# Patient Record
Sex: Female | Born: 1965
Health system: Southern US, Community
[De-identification: ages and names within clinical notes are randomized; demographics above are authoritative.]

## PROBLEM LIST (undated history)

## (undated) DIAGNOSIS — E119 Type 2 diabetes mellitus without complications: Secondary | ICD-10-CM

## (undated) DIAGNOSIS — B019 Varicella without complication: Secondary | ICD-10-CM

## (undated) DIAGNOSIS — I10 Essential (primary) hypertension: Secondary | ICD-10-CM

## (undated) DIAGNOSIS — B009 Herpesviral infection, unspecified: Secondary | ICD-10-CM

## (undated) HISTORY — DX: Type 2 diabetes mellitus without complications: E11.9

## (undated) HISTORY — PX: HERNIA REPAIR: SHX51

## (undated) HISTORY — DX: Essential (primary) hypertension: I10

## (undated) HISTORY — DX: Varicella without complication: B01.9

## (undated) HISTORY — DX: Herpesviral infection, unspecified: B00.9

---

## 1989-04-16 HISTORY — PX: TUBAL LIGATION: SHX77

## 2007-08-07 ENCOUNTER — Ambulatory Visit: Payer: Self-pay | Admitting: Family Medicine

## 2008-02-05 ENCOUNTER — Ambulatory Visit: Payer: Self-pay | Admitting: Family Medicine

## 2009-02-21 LAB — HM DIABETES FOOT EXAM: HM DIABETIC FOOT EXAM: NORMAL

## 2010-10-30 LAB — HM PAP SMEAR: HM Pap smear: NORMAL

## 2012-06-11 ENCOUNTER — Ambulatory Visit: Payer: Self-pay | Admitting: Family Medicine

## 2012-06-12 ENCOUNTER — Ambulatory Visit: Payer: Self-pay | Admitting: Family Medicine

## 2013-01-26 LAB — HM MAMMOGRAPHY: HM Mammogram: ABNORMAL

## 2013-04-16 HISTORY — PX: CHOLECYSTECTOMY: SHX55

## 2013-08-12 ENCOUNTER — Ambulatory Visit: Payer: Self-pay | Admitting: Family Medicine

## 2013-08-28 ENCOUNTER — Ambulatory Visit: Payer: Self-pay | Admitting: Gastroenterology

## 2013-10-26 LAB — HM COLONOSCOPY: HM COLON: NEGATIVE

## 2014-03-01 ENCOUNTER — Ambulatory Visit: Payer: Self-pay | Admitting: Surgery

## 2014-03-01 LAB — COMPREHENSIVE METABOLIC PANEL
ANION GAP: 7 (ref 7–16)
Albumin: 3.3 g/dL — ABNORMAL LOW (ref 3.4–5.0)
Alkaline Phosphatase: 71 U/L
BUN: 12 mg/dL (ref 7–18)
Bilirubin,Total: 0.3 mg/dL (ref 0.2–1.0)
CREATININE: 0.74 mg/dL (ref 0.60–1.30)
Calcium, Total: 8.2 mg/dL — ABNORMAL LOW (ref 8.5–10.1)
Chloride: 103 mmol/L (ref 98–107)
Co2: 28 mmol/L (ref 21–32)
Glucose: 246 mg/dL — ABNORMAL HIGH (ref 65–99)
OSMOLALITY: 284 (ref 275–301)
Potassium: 4 mmol/L (ref 3.5–5.1)
SGOT(AST): 19 U/L (ref 15–37)
SGPT (ALT): 26 U/L
Sodium: 138 mmol/L (ref 136–145)
Total Protein: 7.4 g/dL (ref 6.4–8.2)

## 2014-03-01 LAB — CBC WITH DIFFERENTIAL/PLATELET
BASOS ABS: 0.1 10*3/uL (ref 0.0–0.1)
Basophil %: 1.1 %
EOS ABS: 0.1 10*3/uL (ref 0.0–0.7)
EOS PCT: 0.9 %
HCT: 40 % (ref 35.0–47.0)
HGB: 13 g/dL (ref 12.0–16.0)
Lymphocyte #: 2 10*3/uL (ref 1.0–3.6)
Lymphocyte %: 27 %
MCH: 26.1 pg (ref 26.0–34.0)
MCHC: 32.5 g/dL (ref 32.0–36.0)
MCV: 80 fL (ref 80–100)
Monocyte #: 0.5 x10 3/mm (ref 0.2–0.9)
Monocyte %: 6.9 %
Neutrophil #: 4.9 10*3/uL (ref 1.4–6.5)
Neutrophil %: 64.1 %
Platelet: 275 10*3/uL (ref 150–440)
RBC: 4.97 10*6/uL (ref 3.80–5.20)
RDW: 14.4 % (ref 11.5–14.5)
WBC: 7.6 10*3/uL (ref 3.6–11.0)

## 2014-03-05 ENCOUNTER — Ambulatory Visit: Payer: Self-pay | Admitting: Surgery

## 2014-06-28 LAB — HM DIABETES EYE EXAM

## 2014-08-07 NOTE — Op Note (Signed)
PATIENT NAME:  Cassandra [name], Cassandra [name] MR#:  [id number] DATE OF BIRTH:  [birth year]  DATE OF PROCEDURE:  03/05/2014  PREOPERATIVE DIAGNOSIS: Chronic cholecystitis, cholelithiasis.   POSTOPERATIVE DIAGNOSIS: Chronic cholecystitis, cholelithiasis.   PROCEDURE: Laparoscopic cholecystectomy, cholangiogram.   SURGEON: Renda RollsWilton Louanna Vanliew, MD  ANESTHESIA: General.   INDICATIONS: This 49 year old female has a history of pains in the region of the right lower lateral anterior rib cage. She had ultrasound findings of gallstones and surgery was recommended for definitive treatment.   DESCRIPTION OF PROCEDURE: The patient was placed on the operating table in the supine position under general endotracheal anesthesia. The abdomen was prepared with ChloraPrep and draped in a sterile manner.   A short incision was made in the upper aspect of the umbilicus and carried down to the deep fascia which was grasped with laryngeal hook and elevated. A Veress needle was inserted, aspirated, and irrigated with a saline solution. Next, the peritoneal cavity was inflated with carbon dioxide. The Veress needle was removed. The 10 mm cannula was inserted. The 10 mm 0 degree laparoscope was inserted to view the peritoneal cavity. There were some adhesions between the omentum and the abdominal wall. The scope was manipulated around these adhesions of the omentum to see the upper abdomen. There did appear to be some fatty infiltration of the liver. The patient was placed in the reverse Trendelenburg position. Another incision was made in the epigastrium slightly to the right of the midline to introduce an 11 mm cannula. Two incisions were made in the lateral aspect of the right upper quadrant to introduce two 5 mm cannulas.   The patient was tilted a few degrees to the left. The gallbladder was retracted towards the right shoulder. Multiple adhesions were taken down with blunt and sharp dissection and use of hook and cautery. The gallbladder  neck was retracted inferiorly and laterally. The cystic artery was dissected free from surrounding structures and controlled with Endo Clip and divided. Another branch of the cystic artery was dissected free from surrounding structures and controlled with Endo Clip and divided. The cystic duct was dissected free from surrounding structures. The gallbladder neck was mobilized with incision of the visceral peritoneum. A critical view of safety was demonstrated. An Endo Clip was placed across the cystic duct adjacent to the gallbladder neck. An incision was made in the cystic duct to introduce a Reddick catheter. Half-strength Conray-60 dye was injected as the cholangiogram was done with fluoroscopy viewing the biliary tree with prompt flow of dye into the duodenum. No retained stones were seen. The cholangiogram appeared normal. The Reddick catheter was removed. The cystic duct was doubly ligated with endoclips and divided. There were several small, fine tubular structures, possibly ducts of Lushca which were controlled with and endo-clip and divided. The gallbladder was further dissected free from surrounding liver and was completely separated. Hemostasis was intact. The gallbladder was brought up through the infraumbilical incision. I did also note there were some adhesions between the omentum and the anterior abdominal wall. The gallbladder was pulled up through the incision and was opened and suctioned. The stone scoop was inserted and removed multiple stones. The gallbladder was subsequently removed from the abdomen and submitted in formalin with stones for routine pathology. The right upper quadrant was further inspected. The cannulas were removed. Carbon dioxide was allowed to escape from the peritoneal cavity. The fascial defect at the umbilicus was closed with a 0 Vicryl suture. The skin incisions were closed with interrupted 5-0 chromic subcuticular  sutures, benzoin, and Steri-Strips. Dressings were applied  with paper tape. The patient tolerated surgery satisfactorily and was prepared for transfer to the recovery room.  ____________________________ Shela Commons. Renda Rolls, MD jws:sb [name]: 03/05/2014 14:40:30 ET T: 03/05/2014 17:02:33 ET JOB#: [id number]  cc: Adella Hare, MD, <Dictator> Adella Hare MD ELECTRONICALLY SIGNED 03/09/2014 9:11

## 2014-08-09 LAB — SURGICAL PATHOLOGY

## 2015-02-15 ENCOUNTER — Encounter: Payer: Self-pay | Admitting: Nurse Practitioner

## 2015-02-15 ENCOUNTER — Ambulatory Visit (INDEPENDENT_AMBULATORY_CARE_PROVIDER_SITE_OTHER): Payer: BC Managed Care – PPO | Admitting: Nurse Practitioner

## 2015-02-15 VITALS — BP 138/80 | HR 76 | Temp 97.9°F | Resp 14 | Ht 67.0 in | Wt 218.6 lb

## 2015-02-15 DIAGNOSIS — I10 Essential (primary) hypertension: Secondary | ICD-10-CM | POA: Diagnosis not present

## 2015-02-15 DIAGNOSIS — Z7189 Other specified counseling: Secondary | ICD-10-CM

## 2015-02-15 DIAGNOSIS — E119 Type 2 diabetes mellitus without complications: Secondary | ICD-10-CM | POA: Diagnosis not present

## 2015-02-15 DIAGNOSIS — B009 Herpesviral infection, unspecified: Secondary | ICD-10-CM

## 2015-02-15 DIAGNOSIS — Z7689 Persons encountering health services in other specified circumstances: Secondary | ICD-10-CM

## 2015-02-15 MED ORDER — METFORMIN HCL 1000 MG PO TABS: 1000.0000 mg | ORAL_TABLET | Freq: Two times a day (BID) | ORAL | Status: DC

## 2015-02-15 MED ORDER — LISINOPRIL-HYDROCHLOROTHIAZIDE 10-12.5 MG PO TABS: 1.0000 | ORAL_TABLET | Freq: Every day | ORAL | Status: DC

## 2015-02-15 NOTE — Progress Notes (Signed)
Patient ID: Cassandra [name], female    DOB: Mar 16, 1966  Age: 49 y.o. MRN: [id number]  CC: Establish Care   HPI Cassandra [name] presents for follow up of HTN.   1) New pt info:   Immunizations- tdap 2010, pna 2015, flu-today  Mammogram- 01/2013 two done due to findings, 2nd was normal   Pap- 2012 normal per pt   Colonoscopy- 2015 normal per pt  Eye Exam- 3/16   LMP- 02/09/15  Foot exam in [facility] 2010   2) Chronic Problems-  Diabetes- 3 x a week   HTN- Out of meds for 1.5 weeks  Herpes- 18 years ago diagnosed- 1-2 outbreaks a year, genital    3) Acute Problems-  Out of blood pressure medication for 1 week  Metformin 1,000 twice daily   Fasting 170, 140's, over 200 once   Clayborn Bigness- Last provider  History [place] has a past medical history of Chicken pox; Diabetes mellitus without complication (HCC); Hypertension; and Herpes.   She has past surgical history that includes Cholecystectomy (2015); Hernia repair; and Tubal ligation (1991).   Her family history includes Diabetes in her mother; Hypertension in her mother.She reports that she has never smoked. She has never used smokeless tobacco. She reports that she does not drink alcohol or use illicit drugs.  No outpatient prescriptions prior to visit.   No facility-administered medications prior to visit.    ROS Review of Systems  Constitutional: Negative for fever, chills, diaphoresis and fatigue.  Respiratory: Negative for chest tightness, shortness of breath and wheezing.   Cardiovascular: Negative for chest pain, palpitations and leg swelling.  Gastrointestinal: Negative for nausea, vomiting and diarrhea.  Skin: Negative for rash.  Neurological: Negative for dizziness, weakness, numbness and headaches.  Psychiatric/Behavioral: The patient is not nervous/anxious.     Objective:  BP 138/80 mmHg  Pulse 76  Temp(Src) 97.9 F (36.6 C)  Resp 14  Ht  (1.702 m)  Wt 218 lb 9.6 oz (99.156 kg)  BMI 34.23  kg/m2  SpO2 99%  Physical Exam  Constitutional: She is oriented to person, place, and time. She appears well-developed and well-nourished. No distress.  HENT:  Head: Normocephalic and atraumatic.  Right Ear: External ear normal.  Left Ear: External ear normal.  Cardiovascular: Normal rate, regular rhythm and normal heart sounds.  Exam reveals no gallop and no friction rub.   No murmur heard. Pulmonary/Chest: Effort normal and breath sounds normal. No respiratory distress. She has no wheezes. She has no rales. She exhibits no tenderness.  Neurological: She is alert and oriented to person, place, and time. No cranial nerve deficit. She exhibits normal muscle tone. Coordination normal.  Skin: Skin is warm and dry. No rash noted. She is not diaphoretic.  Psychiatric: She has a normal mood and affect. Her behavior is normal. Judgment and thought content normal.   Assessment & Plan:   [place] was seen today for establish care.  Diagnoses and all orders for this visit:  Benign essential HTN  Encounter to establish care  Controlled type 2 diabetes mellitus without complication, without long-term current use of insulin (HCC)  Herpes simplex  Other orders -     metFORMIN (GLUCOPHAGE) 1000 MG tablet; Take 1 tablet (1,000 mg total) by mouth 2 (two) times daily. -     lisinopril-hydrochlorothiazide (PRINZIDE,ZESTORETIC) 10-12.5 MG tablet; Take 1 tablet by mouth daily.   I have changed Ms. Mullendore's metFORMIN. I am also having her maintain her cholecalciferol and lisinopril-hydrochlorothiazide.  Meds  ordered this encounter  Medications  . DISCONTD: lisinopril-hydrochlorothiazide (PRINZIDE,ZESTORETIC) 10-12.5 MG tablet    Sig: Take 1 tablet by mouth daily.  Marland Kitchen. DISCONTD: metFORMIN (GLUCOPHAGE) 1000 MG tablet    Sig: Take 1 tablet by mouth 2 (two) times daily.  . cholecalciferol (VITAMIN D) 1000 UNITS tablet    Sig: Take 1,000 Units by mouth daily.  . metFORMIN (GLUCOPHAGE) 1000 MG tablet     Sig: Take 1 tablet (1,000 mg total) by mouth 2 (two) times daily.    Dispense:  60 tablet    Refill:  2    Order Specific Question:  Supervising Provider    Answer:  Duncan DullULLO, TERESA L [[id number]]  . lisinopril-hydrochlorothiazide (PRINZIDE,ZESTORETIC) 10-12.5 MG tablet    Sig: Take 1 tablet by mouth daily.    Dispense:  30 tablet    Refill:  2    Order Specific Question:  Supervising Provider    Answer:  Sherlene ShamsULLO, TERESA L [[id number]]     Follow-up: Return in about 3 months (around 05/18/2015).

## 2015-02-15 NOTE — Patient Instructions (Addendum)
Welcome to [facility]! Nice to meet you.    Follow up in 3 months for possible physical, labs, or diabetes follow up.

## 2015-02-15 NOTE — Progress Notes (Signed)
Pre visit review using our clinic review tool, if applicable. No additional management support is needed unless otherwise documented below in the visit note. 

## 2015-02-20 DIAGNOSIS — E118 Type 2 diabetes mellitus with unspecified complications: Secondary | ICD-10-CM | POA: Insufficient documentation

## 2015-02-20 DIAGNOSIS — Z0000 Encounter for general adult medical examination without abnormal findings: Secondary | ICD-10-CM | POA: Insufficient documentation

## 2015-02-20 DIAGNOSIS — B009 Herpesviral infection, unspecified: Secondary | ICD-10-CM | POA: Insufficient documentation

## 2015-02-20 DIAGNOSIS — E119 Type 2 diabetes mellitus without complications: Secondary | ICD-10-CM | POA: Insufficient documentation

## 2015-02-20 DIAGNOSIS — I10 Essential (primary) hypertension: Secondary | ICD-10-CM | POA: Insufficient documentation

## 2015-02-20 NOTE — Assessment & Plan Note (Signed)
Controlled. Pt reports metformin twice daily with occasionally high BS. Encouraged working on [employer] diet and exercise. Refills sent to pharmacy as requested. Will follow.   EYE and FOOT exams are UTD.

## 2015-02-20 NOTE — Assessment & Plan Note (Signed)
Discussed acute and chronic issues. Reviewed health maintenance measures, PFSHx, and immunizations. Obtain records from previous facility.   

## 2015-02-20 NOTE — Assessment & Plan Note (Signed)
BP Readings from Last 3 Encounters:  02/15/15 138/80   Pt has been out of medication for 1 weeks approx. Will send refills to pharmacy.

## 2015-02-20 NOTE — Assessment & Plan Note (Signed)
1-2 outbreaks a year of genital herpes. Pt will let me know if she needs an antiviral.

## 2015-05-19 ENCOUNTER — Ambulatory Visit (INDEPENDENT_AMBULATORY_CARE_PROVIDER_SITE_OTHER): Payer: BC Managed Care – PPO | Admitting: Nurse Practitioner

## 2015-05-19 ENCOUNTER — Encounter: Payer: Self-pay | Admitting: Nurse Practitioner

## 2015-05-19 VITALS — BP 110/70 | HR 80 | Temp 97.9°F | Ht 67.0 in | Wt 214.5 lb

## 2015-05-19 DIAGNOSIS — E119 Type 2 diabetes mellitus without complications: Secondary | ICD-10-CM

## 2015-05-19 DIAGNOSIS — R197 Diarrhea, unspecified: Secondary | ICD-10-CM | POA: Diagnosis not present

## 2015-05-19 DIAGNOSIS — I10 Essential (primary) hypertension: Secondary | ICD-10-CM

## 2015-05-19 DIAGNOSIS — B009 Herpesviral infection, unspecified: Secondary | ICD-10-CM

## 2015-05-19 LAB — COMPREHENSIVE METABOLIC PANEL
ALBUMIN: 4 g/dL (ref 3.5–5.2)
ALK PHOS: 66 U/L (ref 39–117)
ALT: 16 U/L (ref 0–35)
AST: 13 U/L (ref 0–37)
BILIRUBIN TOTAL: 0.4 mg/dL (ref 0.2–1.2)
BUN: 13 mg/dL (ref 6–23)
CALCIUM: 9.5 mg/dL (ref 8.4–10.5)
CO2: 24 mEq/L (ref 19–32)
Chloride: 99 mEq/L (ref 96–112)
Creatinine, Ser: 0.78 mg/dL (ref 0.40–1.20)
GFR: 101.14 mL/min (ref >60.00)
GLUCOSE: 180 mg/dL — AB (ref 70–99)
Potassium: 4.3 mEq/L (ref 3.5–5.1)
Sodium: 132 mEq/L — ABNORMAL LOW (ref 135–145)
TOTAL PROTEIN: 7.6 g/dL (ref 6.0–8.3)

## 2015-05-19 LAB — TSH: TSH: 1.88 u[IU]/mL (ref 0.35–4.50)

## 2015-05-19 LAB — CBC WITH DIFFERENTIAL/PLATELET
BASOS PCT: 0.5 % (ref 0.0–3.0)
Basophils Absolute: 0 10*3/uL (ref 0.0–0.1)
EOS PCT: 1.2 % (ref 0.0–5.0)
Eosinophils Absolute: 0.1 10*3/uL (ref 0.0–0.7)
HCT: 40 % (ref 36.0–46.0)
HEMOGLOBIN: 13.1 g/dL (ref 12.0–15.0)
LYMPHS ABS: 2.5 10*3/uL (ref 0.7–4.0)
Lymphocytes Relative: 37.8 % (ref 12.0–46.0)
MCHC: 32.8 g/dL (ref 30.0–36.0)
MCV: 78.8 fl (ref 78.0–100.0)
MONO ABS: 0.5 10*3/uL (ref 0.1–1.0)
MONOS PCT: 7.4 % (ref 3.0–12.0)
NEUTROS PCT: 53.1 % (ref 43.0–77.0)
Neutro Abs: 3.6 10*3/uL (ref 1.4–7.7)
Platelets: 283 10*3/uL (ref 150.0–400.0)
RBC: 5.08 Mil/uL (ref 3.87–5.11)
RDW: 14.5 % (ref 11.5–15.5)
WBC: 6.7 10*3/uL (ref 4.0–10.5)

## 2015-05-19 LAB — HEMOGLOBIN A1C: Hgb A1c MFr Bld: 8.9 % — ABNORMAL HIGH (ref 4.6–6.5)

## 2015-05-19 MED ORDER — VALACYCLOVIR HCL 500 MG PO TABS: ORAL_TABLET | ORAL | Status: DC

## 2015-05-19 NOTE — Assessment & Plan Note (Signed)
Outbreak currently.  Valtrex sent to pharmacy  Has a few a year she reports

## 2015-05-19 NOTE — Assessment & Plan Note (Signed)
Pt reports this started after gallbladder removed. Improved into soft stools today. Will check TSH, CMET and CBC w. Diff today. Advised her to add fiber to her diet and look for a multivitamin with probiotic added.

## 2015-05-19 NOTE — Progress Notes (Signed)
Pre visit review using our clinic review tool, if applicable. No additional management support is needed unless otherwise documented below in the visit note. 

## 2015-05-19 NOTE — Patient Instructions (Addendum)
Please visit the lab today.   Please make a visit for your physical- we will check cholesterol (fasting) at that visit.   Good to see you!

## 2015-05-19 NOTE — Assessment & Plan Note (Signed)
Controlled per pt- will obtain A1c and CMET today.  Continue current regimen

## 2015-05-19 NOTE — Assessment & Plan Note (Signed)
BP Readings from Last 3 Encounters:  05/19/15 110/70  02/15/15 138/80   Stable. Continue current regimen

## 2015-05-19 NOTE — Progress Notes (Signed)
Patient ID: Cassandra [name], female    DOB: Sep 05, 1965  Age: 50 y.o. MRN: [id number]  CC: Follow-up   HPI [name] presents for follow up of DM and HTN.   1) DM- Blood sugars, 140's-170's, denies hypoglycemia   2) HTN- Looks good today, does not check at home   3) Herpes- Valtrex, having outbreaks- one currently   4) Had a bout of diarrhea- 4-5 x a day and watery, improving  End of Nov. Through first week in Dec.  2 or more times a day for diarrhea Feels it started after gallbladder   History [place] has a past medical history of Chicken pox; Diabetes mellitus without complication (HCC); Hypertension; and Herpes.   She has past surgical history that includes Cholecystectomy (2015); Hernia repair; and Tubal ligation (1991).   Her family history includes Diabetes in her mother; Hypertension in her mother.She reports that she has never smoked. She has never used smokeless tobacco. She reports that she does not drink alcohol or use illicit drugs.  Outpatient Prescriptions Prior to Visit  Medication Sig Dispense Refill  . cholecalciferol (VITAMIN D) 1000 UNITS tablet Take 1,000 Units by mouth daily.    Marland Kitchen lisinopril-hydrochlorothiazide (PRINZIDE,ZESTORETIC) 10-12.5 MG tablet Take 1 tablet by mouth daily. 30 tablet 2  . metFORMIN (GLUCOPHAGE) 1000 MG tablet Take 1 tablet (1,000 mg total) by mouth 2 (two) times daily. 60 tablet 2   No facility-administered medications prior to visit.    ROS Review of Systems  Constitutional: Negative for fever, chills, diaphoresis and fatigue.  Respiratory: Negative for chest tightness, shortness of breath and wheezing.   Cardiovascular: Negative for chest pain, palpitations and leg swelling.  Gastrointestinal: Positive for diarrhea. Negative for nausea and vomiting.  Endocrine: Negative for polydipsia, polyphagia and polyuria.  Genitourinary: Positive for menstrual problem.       Irregular  Skin: Negative for rash.  Neurological:  Negative for dizziness, weakness, numbness and headaches.  Psychiatric/Behavioral: The patient is not nervous/anxious.     Objective:  BP 110/70 mmHg  Pulse 80  Temp(Src) 97.9 F (36.6 C) (Oral)  Ht  (1.702 m)  Wt 214 lb 8 oz (97.297 kg)  BMI 33.59 kg/m2  SpO2 98%  LMP 04/27/2015  Physical Exam  Constitutional: She is oriented to person, place, and time. She appears well-developed and well-nourished. No distress.  HENT:  Head: Normocephalic and atraumatic.  Right Ear: External ear normal.  Left Ear: External ear normal.  Mouth/Throat: No oropharyngeal exudate.  Eyes: EOM are normal. Pupils are equal, round, and reactive to light. Right eye exhibits no discharge. Left eye exhibits no discharge. No scleral icterus.  Cardiovascular: Normal rate, regular rhythm and normal heart sounds.  Exam reveals no gallop and no friction rub.   No murmur heard. Pulmonary/Chest: Effort normal and breath sounds normal. No respiratory distress. She has no wheezes. She has no rales. She exhibits no tenderness.  Abdominal: Soft. Bowel sounds are normal. She exhibits no distension and no mass. There is no tenderness. There is no rebound and no guarding.  Neurological: She is alert and oriented to person, place, and time. Coordination normal.  Skin: Skin is warm and dry. No rash noted. She is not diaphoretic.  Psychiatric: She has a normal mood and affect. Her behavior is normal. Judgment and thought content normal.      Assessment & Plan:   Sarrinah was seen today for follow-up.  Diagnoses and all orders for this visit:  Benign essential HTN -  Comprehensive metabolic panel  Controlled type 2 diabetes mellitus without complication, without long-term current use of insulin (HCC) -     Hemoglobin A1c -     CBC with Differential/Platelet  Herpes simplex  Diarrhea, unspecified type -     Comprehensive metabolic panel -     CBC with Differential/Platelet -     TSH  Other orders -      valACYclovir (VALTREX) 500 MG tablet; Take 1 tablet twice daily by mouth for 3 days at first sign of outbreak   I am having Ms. Bhullar start on valACYclovir. I am also having her maintain her cholecalciferol, metFORMIN, and lisinopril-hydrochlorothiazide.  Meds ordered this encounter  Medications  . valACYclovir (VALTREX) 500 MG tablet    Sig: Take 1 tablet twice daily by mouth for 3 days at first sign of outbreak    Dispense:  30 tablet    Refill:  0    Order Specific Question:  Supervising Provider    Answer:  Sherlene Shams [[id number]]     Follow-up: Return if symptoms worsen or fail to improve.

## 2015-05-23 ENCOUNTER — Ambulatory Visit (INDEPENDENT_AMBULATORY_CARE_PROVIDER_SITE_OTHER): Payer: BC Managed Care – PPO | Admitting: Family Medicine

## 2015-05-23 ENCOUNTER — Encounter: Payer: Self-pay | Admitting: Family Medicine

## 2015-05-23 ENCOUNTER — Other Ambulatory Visit: Payer: Self-pay | Admitting: Nurse Practitioner

## 2015-05-23 VITALS — BP 138/68 | HR 95 | Temp 98.5°F | Ht 67.0 in | Wt 216.1 lb

## 2015-05-23 DIAGNOSIS — B349 Viral infection, unspecified: Secondary | ICD-10-CM | POA: Diagnosis not present

## 2015-05-23 DIAGNOSIS — B9789 Other viral agents as the cause of diseases classified elsewhere: Secondary | ICD-10-CM

## 2015-05-23 DIAGNOSIS — E871 Hypo-osmolality and hyponatremia: Secondary | ICD-10-CM

## 2015-05-23 DIAGNOSIS — J988 Other specified respiratory disorders: Principal | ICD-10-CM

## 2015-05-23 LAB — POCT INFLUENZA A/B
Influenza A, POC: NEGATIVE
Influenza B, POC: NEGATIVE

## 2015-05-23 MED ORDER — AMOXICILLIN-POT CLAVULANATE 875-125 MG PO TABS: 1.0000 | ORAL_TABLET | Freq: Two times a day (BID) | ORAL | Status: DC

## 2015-05-23 NOTE — Progress Notes (Signed)
   Subjective:  Patient ID: Cassandra [name], female    DOB: 1965/10/18  Age: 50 y.o. MRN: [id number]  CC: ? Sinus infections  HPI:  50 year old female presents with complaints that she has a sinus infection.  Patient reports that she's been sick since Friday. She's been experiencing sore throat, body aches, congestion, cough. No associated fever. No known exacerbating or relieving factors. No intervention stride. 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 She feels that she has a sinus infection as she has had several of these in the past.  Social Hx   Social History   Social History  . Marital Status: Single    Spouse Name: N/A  . Number of Children: N/A  . Years of Education: N/A   Social History Main Topics  . Smoking status: Never Smoker   . Smokeless tobacco: Never Used  . Alcohol Use: No  . Drug Use: No  . Sexual Activity: Not Currently   Other Topics Concern  . None   Social History Narrative   2 children (Sons)    Single   Employed Youth worker   GED   Caffeine- Pepsi- x3 20 oz, no tea, coffee when cold- 1 cup      Review of Systems  Constitutional: Negative for fever.  HENT: Positive for congestion and sore throat.   Respiratory: Positive for cough.     Objective:  BP 138/68 mmHg  Pulse 95  Temp(Src) 98.5 F (36.9 C) (Oral)  Ht  (1.702 m)  Wt 216 lb 2 oz (98.034 kg)  BMI 33.84 kg/m2  SpO2 95%  LMP 04/27/2015  BP/Weight 05/23/2015 05/19/2015 02/15/2015  Systolic BP 138 110 138  Diastolic BP 68 70 80  Wt. (Lbs) 216.13 214.5 218.6  BMI 33.84 33.59 34.23   Physical Exam  Constitutional: She is oriented to person, place, and time. She appears well-developed. No distress.  HENT:  Head: Normocephalic and atraumatic.  Normal TM's bilaterally.  Oropharynx clear. Mild maxillary sinus tenderness.   Eyes: Conjunctivae are normal.  Neck: Neck supple.  Cardiovascular: Normal rate and regular rhythm.   Pulmonary/Chest: Effort  normal and breath sounds normal.  Neurological: She is alert and oriented to person, place, and time.  Vitals reviewed.  Lab Results  Component Value Date   WBC 6.7 05/19/2015   HGB 13.1 05/19/2015   HCT 40.0 05/19/2015   PLT 283.0 05/19/2015   GLUCOSE 180* 05/19/2015   ALT 16 05/19/2015   AST 13 05/19/2015   NA 132* 05/19/2015   K 4.3 05/19/2015   CL 99 05/19/2015   CREATININE 0.78 05/19/2015   BUN 13 05/19/2015   CO2 24 05/19/2015   TSH 1.88 05/19/2015   HGBA1C 8.9* 05/19/2015    Assessment & Plan:   Problem List Items Addressed This Visit    Viral respiratory illness - Primary    New problem. Exam unremarkable. Advised supportive care and OTC meds. Augmentin to be filled if she fails to improve.      Relevant Orders   POCT Influenza A/B (Completed)      Meds ordered this encounter  Medications  . amoxicillin-clavulanate (AUGMENTIN) 875-125 MG tablet    Sig: Take 1 tablet by mouth 2 (two) times daily.    Dispense:  20 tablet    Refill:  0    Follow-up: PRN  Everlene Other DO [facility]

## 2015-05-23 NOTE — Patient Instructions (Signed)
This is likely viral.  If you fail to improve, fill the antibiotic (Augmentin).  Take care  Dr. Adriana Simas

## 2015-05-23 NOTE — Assessment & Plan Note (Signed)
New problem. Exam unremarkable. Advised supportive care and OTC meds. Augmentin to be filled if she fails to improve.

## 2015-05-23 NOTE — Progress Notes (Signed)
Pre visit review using our clinic review tool, if applicable. No additional management support is needed unless otherwise documented below in the visit note. 

## 2015-06-04 ENCOUNTER — Other Ambulatory Visit: Payer: Self-pay | Admitting: Nurse Practitioner

## 2015-06-06 ENCOUNTER — Other Ambulatory Visit: Payer: Self-pay | Admitting: Nurse Practitioner

## 2015-07-08 ENCOUNTER — Other Ambulatory Visit: Payer: Self-pay | Admitting: Nurse Practitioner

## 2015-07-21 ENCOUNTER — Encounter: Payer: Self-pay | Admitting: Nurse Practitioner

## 2015-08-17 ENCOUNTER — Other Ambulatory Visit: Payer: Self-pay | Admitting: Nurse Practitioner

## 2015-08-29 ENCOUNTER — Encounter: Payer: Self-pay | Admitting: Nurse Practitioner

## 2015-09-19 ENCOUNTER — Other Ambulatory Visit: Payer: Self-pay | Admitting: Nurse Practitioner

## 2015-10-25 ENCOUNTER — Ambulatory Visit: Payer: Self-pay | Admitting: Family

## 2015-10-25 ENCOUNTER — Telehealth: Payer: Self-pay | Admitting: Nurse Practitioner

## 2015-10-25 NOTE — Telephone Encounter (Signed)
Noted thanks  [name], how would you like us to proceed, thanks

## 2015-10-25 NOTE — Telephone Encounter (Signed)
Ok

## 2015-10-25 NOTE — Telephone Encounter (Signed)
It does not appear the patient was ever on my schedule today. It appears that she was on Margaret's schedule. I will forward this to her.

## 2015-10-25 NOTE — Telephone Encounter (Signed)
Cancel thanks

## 2015-10-25 NOTE — Telephone Encounter (Signed)
FYI, Pt called not able to come in today for her appt. Pt states she has another appt for today. Pt did resch her appt. Appt is still on the sch. Let me know if you want me to cancel appt. Thank you!

## 2015-10-25 NOTE — Telephone Encounter (Signed)
See she is coming in to see me 7/18. thanks

## 2015-10-25 NOTE — Telephone Encounter (Signed)
Please advise, thanks.

## 2015-11-01 ENCOUNTER — Encounter: Payer: Self-pay | Admitting: Family

## 2015-11-01 ENCOUNTER — Ambulatory Visit (INDEPENDENT_AMBULATORY_CARE_PROVIDER_SITE_OTHER): Payer: BC Managed Care – PPO | Admitting: Family

## 2015-11-01 VITALS — BP 104/82 | HR 86 | Ht 67.0 in | Wt 225.0 lb

## 2015-11-01 DIAGNOSIS — Z7689 Persons encountering health services in other specified circumstances: Secondary | ICD-10-CM

## 2015-11-01 DIAGNOSIS — I10 Essential (primary) hypertension: Secondary | ICD-10-CM

## 2015-11-01 DIAGNOSIS — R05 Cough: Secondary | ICD-10-CM

## 2015-11-01 DIAGNOSIS — E119 Type 2 diabetes mellitus without complications: Secondary | ICD-10-CM | POA: Diagnosis not present

## 2015-11-01 DIAGNOSIS — K219 Gastro-esophageal reflux disease without esophagitis: Secondary | ICD-10-CM

## 2015-11-01 DIAGNOSIS — R059 Cough, unspecified: Secondary | ICD-10-CM

## 2015-11-01 LAB — MICROALBUMIN / CREATININE URINE RATIO
Creatinine,U: 211.4 mg/dL
Microalb Creat Ratio: 0.3 mg/g (ref 0.0–30.0)
Microalb, Ur: 0.7 mg/dL (ref 0.0–1.9)

## 2015-11-01 MED ORDER — LIRAGLUTIDE 18 MG/3ML ~~LOC~~ SOPN: PEN_INJECTOR | SUBCUTANEOUS | Status: DC

## 2015-11-01 MED ORDER — ALBUTEROL SULFATE HFA 108 (90 BASE) MCG/ACT IN AERS: 2.0000 | INHALATION_SPRAY | Freq: Four times a day (QID) | RESPIRATORY_TRACT | Status: DC | PRN

## 2015-11-01 NOTE — Progress Notes (Signed)
Subjective:    Patient ID: Cassandra [name], female    DOB: 01-May-1965, 50 y.o.   MRN: [id number]   [name] is a 50 y.o. female who presents today for an acute visit.    HPI Comments: Patient here for follow-up for diabetes. She is on metformin twice a day. Blood sugar fasting today, 290. Last A1C 8.9 05/2015. Checks blood sugar once per day in the morning.    Also complains of dry cough, chest tightness for past couple of weeks, noticed it more at night. No SOB, wheezing, fever.  No h/o asthma or seasonal allergies. Endorses GERD which she takes Tums for relief.Denies exertional chest pain or pressure, numbness or tingling radiating to left arm or jaw, palpitations, dizziness, frequent headaches, changes in vision, or shortness of breath.   Due for eye exam.    Past Medical History  Diagnosis Date  . Chicken pox   . Diabetes mellitus without complication (HCC)   . Hypertension   . Herpes    Allergies: Review of patient's allergies indicates no known allergies. Current Outpatient Prescriptions on File Prior to Visit  Medication Sig Dispense Refill  . cholecalciferol (VITAMIN D) 1000 UNITS tablet Take 1,000 Units by mouth daily.    Marland Kitchen lisinopril-hydrochlorothiazide (PRINZIDE,ZESTORETIC) 10-12.5 MG tablet TAKE ONE TABLET BY MOUTH ONCE DAILY 30 tablet 11  . metFORMIN (GLUCOPHAGE) 1000 MG tablet TAKE ONE TABLET BY MOUTH TWICE DAILY 60 tablet 0  . valACYclovir (VALTREX) 500 MG tablet Take 1 tablet twice daily by mouth for 3 days at first sign of outbreak 30 tablet 0   No current facility-administered medications on file prior to visit.    Social History  Substance Use Topics  . Smoking status: Never Smoker   . Smokeless tobacco: Never Used  . Alcohol Use: No    Review of Systems  Constitutional: Negative for fever and chills.  HENT: Negative for congestion and sinus pressure.   Eyes: Negative for visual disturbance.  Respiratory: Positive for cough. Negative for  shortness of breath and wheezing.   Cardiovascular: Negative for chest pain and palpitations.  Gastrointestinal: Negative for nausea and vomiting.  Endocrine: Negative for polydipsia and polyuria.  Neurological: Negative for headaches.      Objective:    BP 104/82 mmHg  Pulse 86  Ht  (1.702 m)  Wt 225 lb (102.059 kg)  BMI 35.23 kg/m2  SpO2 98%  LMP 11/01/2015   Physical Exam  Constitutional: She appears well-developed and well-nourished.  Eyes: Conjunctivae are normal.  Cardiovascular: Normal rate, regular rhythm, normal heart sounds and normal pulses.   Pulmonary/Chest: Effort normal and breath sounds normal. She has no wheezes. She has no rhonchi. She has no rales.  Neurological: She is alert.  Skin: Skin is warm and dry.  Psychiatric: She has a normal mood and affect. Her speech is normal and behavior is normal. Thought content normal.  Vitals reviewed.      Assessment & Plan:   Problem List Items Addressed This Visit      Cardiovascular and Mediastinum   Benign essential HTN    Stable. Well controlled on current regimen.         Digestive   GERD (gastroesophageal reflux disease)    Considering GERD as etiology of cough; will trial PPI if albuterol inhaler doesn't relief symptoms.         Endocrine   Diabetes mellitus type 2, controlled, without complications (HCC)    Uncontrolled. Discussed diet and weight  loss. She has seen dietician in the past and declined another consult. Trial of Victoza and BS log at home.       Relevant Medications   Liraglutide 18 MG/3ML SOPN   Other Relevant Orders   Microalbumin / creatinine urine ratio   Hemoglobin A1c     Other   Encounter to establish care - Primary    Due for CPE, CBE. Ordered mammogram today and patient will follow in one month for thorough CPE.       Relevant Orders   MM DIGITAL SCREENING BILATERAL    Other Visit Diagnoses    Cough        Relevant Medications    albuterol (PROVENTIL HFA) 108  (90 Base) MCG/ACT inhaler   Suspect nocturnal dry cough is asthma. Trial of inhaler.        I have discontinued Ms. Mcginnity's amoxicillin-clavulanate. I am also having her maintain her cholecalciferol, valACYclovir, lisinopril-hydrochlorothiazide, and metFORMIN.   No orders of the defined types were placed in this encounter.    Return precautions given.   Risks, benefits, and alternatives of the medications and treatment plan prescribed today were discussed, and patient expressed understanding.   Education regarding symptom management and diagnosis given to patient on AVS.  Continue to follow with Carollee Leitzarrie M Doss, NP for routine health maintenance.   Larue D Kirksey and I agreed with plan.   Rennie PlowmanMargaret Arnett, FNP

## 2015-11-01 NOTE — Assessment & Plan Note (Signed)
Uncontrolled. Discussed diet and weight loss. She has seen dietician in the past and declined another consult. Trial of Victoza and BS log at home.

## 2015-11-01 NOTE — Patient Instructions (Signed)
Start victoza. Please keep blood sugar log and call us if you have values < 90.   Trial of inhaler for suspected asthma cough.    Mammogram ordered.  If there is no improvement in your symptoms, or if there is any worsening of symptoms, or if you have any additional concerns, please return for re-evaluation; or, if we are closed, consider going to the Emergency Room for evaluation if symptoms urgent.

## 2015-11-01 NOTE — Assessment & Plan Note (Signed)
Stable. Well-controlled on current regimen. 

## 2015-11-01 NOTE — Assessment & Plan Note (Signed)
Due for CPE, CBE. Ordered mammogram today and patient will follow in one month for thorough CPE.

## 2015-11-01 NOTE — Assessment & Plan Note (Signed)
Considering GERD as etiology of cough; will trial PPI if albuterol inhaler doesn't relief symptoms.

## 2015-11-02 ENCOUNTER — Other Ambulatory Visit: Payer: BC Managed Care – PPO

## 2015-11-03 ENCOUNTER — Other Ambulatory Visit: Payer: Self-pay | Admitting: Nurse Practitioner

## 2015-11-03 NOTE — Telephone Encounter (Signed)
Santiago GladFYI tanya  Spoke with patients mother who stated phone is cut off; mother will pass along message to have patient call us back.   When she calls back ( if I'm not there), please ensure she knows we need a lab appt to check A1C. We also added a new diabetic medication, Victoza, to her regimen. Has she been taking it? How are her blood sugars?

## 2015-11-03 NOTE — Telephone Encounter (Signed)
Another FYI Tanya:   Per Callie:  I did want to give the patient the option to have A1c drawn [facility]; Terri or Rodney Boozeasha may have better luck. I hate for patient to have to keep coming back. She is a tough stick.   We can advise her to go to [facility] when she comes back if okay.

## 2015-11-07 ENCOUNTER — Telehealth: Payer: Self-pay

## 2015-11-07 NOTE — Telephone Encounter (Signed)
Error

## 2015-11-14 NOTE — Progress Notes (Signed)
Attempted to contact; canceling lab for now until OV

## 2015-11-30 ENCOUNTER — Encounter: Payer: Self-pay | Admitting: Family

## 2015-11-30 DIAGNOSIS — Z0289 Encounter for other administrative examinations: Secondary | ICD-10-CM

## 2015-12-15 ENCOUNTER — Ambulatory Visit (INDEPENDENT_AMBULATORY_CARE_PROVIDER_SITE_OTHER): Payer: BC Managed Care – PPO | Admitting: Family

## 2015-12-15 ENCOUNTER — Encounter: Payer: Self-pay | Admitting: Family

## 2015-12-15 ENCOUNTER — Other Ambulatory Visit: Payer: Self-pay | Admitting: Family

## 2015-12-15 VITALS — BP 132/80 | HR 102 | Temp 98.1°F | Ht 67.0 in | Wt 220.8 lb

## 2015-12-15 DIAGNOSIS — Z113 Encounter for screening for infections with a predominantly sexual mode of transmission: Secondary | ICD-10-CM | POA: Diagnosis not present

## 2015-12-15 DIAGNOSIS — R1030 Lower abdominal pain, unspecified: Secondary | ICD-10-CM

## 2015-12-15 DIAGNOSIS — R102 Pelvic and perineal pain: Secondary | ICD-10-CM | POA: Insufficient documentation

## 2015-12-15 DIAGNOSIS — E119 Type 2 diabetes mellitus without complications: Secondary | ICD-10-CM | POA: Diagnosis not present

## 2015-12-15 NOTE — Assessment & Plan Note (Addendum)
Patient has nonacute suprapubic worsening pain with menstrual cycles. No nausea, vomiting, fever acute/severe pain suggest appendicitis. Patient politely declines emergency room visit for any acute pain. Pending vaginal and abdominal US.

## 2015-12-15 NOTE — Assessment & Plan Note (Signed)
Patient reports good control. Victoza was too expensive for patient. She is doing well with her diet and I'm hopeful that we can continue on metformin alone. Pending A1c.

## 2015-12-15 NOTE — Progress Notes (Signed)
discontined due to cost

## 2015-12-15 NOTE — Progress Notes (Signed)
Pre visit review using our clinic review tool, if applicable. No additional management support is needed unless otherwise documented below in the visit note. 

## 2015-12-15 NOTE — Patient Instructions (Signed)
Will call you with all results.   Please make lab appt for tomorrow morning, fasting.  If there is no improvement in your symptoms, or if there is any worsening of symptoms, or if you have any additional concerns, please return for re-evaluation; or, if we are closed, consider going to the Emergency Room for evaluation if symptoms urgent.

## 2015-12-15 NOTE — Progress Notes (Signed)
Subjective:    Patient ID: Cassandra [name], female    DOB: [birth year], 50 y.o.   MRN: [id number]  CC: Cassandra [name] is a 50 y.o. female who presents today for follow up.   HPI:  Here for follow up for DM.  This was supposed to CPE however patient prefers to make that appointment in the next couple of months.   DM- On metformin.  Ran out liragluglide and cost is prohibitive. BS running average 140. Has cut back on soft drinks and would like to try staying on only metformin for DM control.   Fibroids- h/o of fibroids and had seen GYN. Chronic suprapubic pain worse with periods for past several years. Not acute. Periods are heavy , sometimes twice per month. Due for Pap.   STD  Testing- no concerns for pregnancy. New female sexual partner over last year. Would like to be screened for all STDs including HIV, syphilis, and Hepatitis. No change in vaginal discharge. H/o genital herpes.   Will reschedule CPE for next week.           HISTORY:  Past Medical History:  Diagnosis Date  . Chicken pox   . Diabetes mellitus without complication (HCC)   . Herpes   . Hypertension    Past Surgical History:  Procedure Laterality Date  . CHOLECYSTECTOMY  2015  . HERNIA REPAIR     50 years old  . TUBAL LIGATION  1991   Family History  Problem Relation Age of Onset  . Hypertension Mother   . Diabetes Mother     Allergies: Review of patient's allergies indicates no known allergies. Current Outpatient Prescriptions on File Prior to Visit  Medication Sig Dispense Refill  . albuterol (PROVENTIL HFA) 108 (90 Base) MCG/ACT inhaler Inhale 2 puffs into the lungs every 6 (six) hours as needed for wheezing or shortness of breath. 1 Inhaler 1  . cholecalciferol (VITAMIN D) 1000 UNITS tablet Take 1,000 Units by mouth daily.    . Liraglutide 18 MG/3ML SOPN Start 0.6 mg [place] qd x 1 week, then 1.2 mg [place] qd thereafter 6 mL 12  . lisinopril-hydrochlorothiazide (PRINZIDE,ZESTORETIC) 10-12.5 MG  tablet TAKE ONE TABLET BY MOUTH ONCE DAILY 30 tablet 11  . metFORMIN (GLUCOPHAGE) 1000 MG tablet TAKE ONE TABLET BY MOUTH TWICE DAILY 60 tablet 0  . metFORMIN (GLUCOPHAGE) 1000 MG tablet TAKE ONE TABLET BY MOUTH TWICE DAILY 60 tablet 0  . valACYclovir (VALTREX) 500 MG tablet Take 1 tablet twice daily by mouth for 3 days at first sign of outbreak 30 tablet 0   No current facility-administered medications on file prior to visit.     Social History  Substance Use Topics  . Smoking status: Never Smoker  . Smokeless tobacco: Never Used  . Alcohol use No    Review of Systems  Constitutional: Negative for chills and fever.  Respiratory: Negative for cough.   Cardiovascular: Negative for chest pain and palpitations.  Gastrointestinal: Positive for abdominal pain and diarrhea (from metformin). Negative for blood in stool, constipation, nausea and vomiting.  Genitourinary: Positive for menstrual problem, vaginal bleeding and vaginal pain. Negative for dysuria, hematuria and vaginal discharge.      Objective:    BP 132/80   Pulse (!) 102   Temp 98.1 F (36.7 C) (Oral)   Ht 5\' 7"  (1.702 m)   Wt 220 lb 12.8 oz (100.2 kg)   SpO2 99%   BMI 34.58 kg/m  BP Readings from Last 3 Encounters:  12/15/15 132/80  11/01/15 104/82  05/23/15 138/68   Wt Readings from Last 3 Encounters:  12/15/15 220 lb 12.8 oz (100.2 kg)  11/01/15 225 lb (102.1 kg)  05/23/15 216 lb 2 oz (98 kg)    Physical Exam  Constitutional: She appears well-developed and well-nourished.  Cardiovascular: Normal rate, regular rhythm, normal heart sounds and normal pulses.   Pulmonary/Chest: Effort normal and breath sounds normal. She has no wheezes. She has no rhonchi. She has no rales.  Abdominal: Soft. Normal appearance and bowel sounds are normal. She exhibits no distension, no fluid wave, no ascites and no mass. There is tenderness in the suprapubic area. There is no rigidity, no rebound, no guarding and no CVA  tenderness.  Mild tenderness noted suprapubic.   Neurological: She is alert.  Skin: Skin is warm and dry.  Psychiatric: She has a normal mood and affect. Her speech is normal and behavior is normal. Thought content normal.  Vitals reviewed.      Assessment & Plan:   Problem List Items Addressed This Visit      Endocrine   Diabetes mellitus type 2, controlled, without complications (HCC)    Patient reports good control. Victoza was too expensive for patient. She is doing well with her diet and I'm hopeful that we can continue on metformin alone. Pending A1c.        Other   Suprapubic pain    Patient has nonacute suprapubic worsening pain with menstrual cycles. No nausea, vomiting, fever acute/severe pain suggest appendicitis. Patient politely declines emergency room visit for any acute pain. Pending vaginal and abdominal US.        Other Visit Diagnoses    Suprapubic abdominal pain, unspecified laterality    -  Primary   Relevant Orders   US OB Transvaginal   US Abdomen Complete   Ambulatory referral to Obstetrics / Gynecology   Type 2 diabetes mellitus without complication, without long-term current use of insulin (HCC)       Relevant Orders   CBC with Differential/Platelet   Comprehensive metabolic panel   Hemoglobin A1c   Lipid panel   TSH   VITAMIN D 25 Hydroxy (Vit-D Deficiency, Fractures)   Screen for STD (sexually transmitted disease)       Relevant Orders   HIV antibody   Acute Hep Panel & Hep B Surface Ab   RPR   Urine cytology ancillary only   hCG, serum, qualitative       I am having Cassandra [name] maintain her cholecalciferol, valACYclovir, lisinopril-hydrochlorothiazide, metFORMIN, albuterol, Liraglutide, and metFORMIN.   No orders of the defined types were placed in this encounter.   Return precautions given.   Risks, benefits, and alternatives of the medications and treatment plan prescribed today were discussed, and patient expressed understanding.    Education regarding symptom management and diagnosis given to patient on AVS.  Continue to follow with Cassandra PlowmanMargaret Arnett, Cassandra [name] for routine health maintenance.   Cassandra [name] and I agreed with plan.   Cassandra PlowmanMargaret Arnett, Cassandra [name]

## 2015-12-16 ENCOUNTER — Other Ambulatory Visit (INDEPENDENT_AMBULATORY_CARE_PROVIDER_SITE_OTHER): Payer: BC Managed Care – PPO

## 2015-12-16 DIAGNOSIS — E119 Type 2 diabetes mellitus without complications: Secondary | ICD-10-CM

## 2015-12-16 DIAGNOSIS — Z113 Encounter for screening for infections with a predominantly sexual mode of transmission: Secondary | ICD-10-CM

## 2015-12-16 LAB — VITAMIN D 25 HYDROXY (VIT D DEFICIENCY, FRACTURES): VITD: 12.55 ng/mL — ABNORMAL LOW (ref 30.00–100.00)

## 2015-12-16 LAB — COMPREHENSIVE METABOLIC PANEL
ALT: 16 U/L (ref 0–35)
AST: 14 U/L (ref 0–37)
Albumin: 4.1 g/dL (ref 3.5–5.2)
Alkaline Phosphatase: 62 U/L (ref 39–117)
BUN: 13 mg/dL (ref 6–23)
CHLORIDE: 100 meq/L (ref 96–112)
CO2: 25 meq/L (ref 19–32)
CREATININE: 0.73 mg/dL (ref 0.40–1.20)
Calcium: 9.3 mg/dL (ref 8.4–10.5)
GFR: 108.91 mL/min (ref >60.00)
GLUCOSE: 204 mg/dL — AB (ref 70–99)
POTASSIUM: 4.2 meq/L (ref 3.5–5.1)
SODIUM: 132 meq/L — AB (ref 135–145)
Total Bilirubin: 0.4 mg/dL (ref 0.2–1.2)
Total Protein: 7.7 g/dL (ref 6.0–8.3)

## 2015-12-16 LAB — LIPID PANEL
CHOL/HDL RATIO: 4
Cholesterol: 189 mg/dL (ref 0–200)
HDL: 53.2 mg/dL (ref >39.00)
LDL CALC: 114 mg/dL — AB (ref 0–99)
NONHDL: 136.22
Triglycerides: 110 mg/dL (ref 0.0–149.0)
VLDL: 22 mg/dL (ref 0.0–40.0)

## 2015-12-16 LAB — CBC WITH DIFFERENTIAL/PLATELET
BASOS PCT: 0.6 % (ref 0.0–3.0)
Basophils Absolute: 0 10*3/uL (ref 0.0–0.1)
EOS ABS: 0.1 10*3/uL (ref 0.0–0.7)
EOS PCT: 1.1 % (ref 0.0–5.0)
HCT: 38.3 % (ref 36.0–46.0)
Hemoglobin: 12.7 g/dL (ref 12.0–15.0)
LYMPHS ABS: 2.7 10*3/uL (ref 0.7–4.0)
Lymphocytes Relative: 40.5 % (ref 12.0–46.0)
MCHC: 33.2 g/dL (ref 30.0–36.0)
MCV: 79.6 fl (ref 78.0–100.0)
MONO ABS: 0.5 10*3/uL (ref 0.1–1.0)
Monocytes Relative: 7.6 % (ref 3.0–12.0)
NEUTROS PCT: 50.2 % (ref 43.0–77.0)
Neutro Abs: 3.3 10*3/uL (ref 1.4–7.7)
Platelets: 288 10*3/uL (ref 150.0–400.0)
RBC: 4.82 Mil/uL (ref 3.87–5.11)
RDW: 14.7 % (ref 11.5–15.5)
WBC: 6.6 10*3/uL (ref 4.0–10.5)

## 2015-12-16 LAB — ACUTE HEP PANEL AND HEP B SURFACE AB
HCV Ab: NEGATIVE
HEP A IGM: NONREACTIVE
HEP B C IGM: NONREACTIVE
HEP B S AB: POSITIVE — AB
Hepatitis B Surface Ag: NEGATIVE

## 2015-12-16 LAB — HEMOGLOBIN A1C: Hgb A1c MFr Bld: 9.1 % — ABNORMAL HIGH (ref 4.6–6.5)

## 2015-12-16 LAB — HIV ANTIBODY (ROUTINE TESTING W REFLEX): HIV: NONREACTIVE

## 2015-12-16 LAB — TSH: TSH: 1.95 u[IU]/mL (ref 0.35–4.50)

## 2015-12-17 LAB — RPR

## 2015-12-17 LAB — HCG, SERUM, QUALITATIVE: Preg, Serum: NEGATIVE

## 2015-12-19 ENCOUNTER — Other Ambulatory Visit: Payer: Self-pay | Admitting: Family

## 2015-12-20 ENCOUNTER — Other Ambulatory Visit: Payer: Self-pay | Admitting: Family

## 2015-12-20 DIAGNOSIS — R1030 Lower abdominal pain, unspecified: Secondary | ICD-10-CM

## 2015-12-20 DIAGNOSIS — E871 Hypo-osmolality and hyponatremia: Secondary | ICD-10-CM

## 2015-12-21 ENCOUNTER — Encounter: Payer: Self-pay | Admitting: Family

## 2015-12-22 ENCOUNTER — Encounter: Payer: Self-pay | Admitting: Family

## 2015-12-26 ENCOUNTER — Telehealth: Payer: Self-pay

## 2015-12-26 ENCOUNTER — Other Ambulatory Visit: Payer: Self-pay

## 2015-12-26 DIAGNOSIS — R1030 Lower abdominal pain, unspecified: Secondary | ICD-10-CM

## 2015-12-26 MED ORDER — METFORMIN HCL 1000 MG PO TABS: 1000.0000 mg | ORAL_TABLET | Freq: Two times a day (BID) | ORAL | 3 refills | Status: DC

## 2015-12-26 NOTE — Telephone Encounter (Signed)
Medication refilled

## 2015-12-26 NOTE — Telephone Encounter (Signed)
Reordered US

## 2015-12-27 ENCOUNTER — Ambulatory Visit: Payer: BC Managed Care – PPO

## 2016-01-03 ENCOUNTER — Other Ambulatory Visit: Payer: Self-pay | Admitting: Family

## 2016-01-03 ENCOUNTER — Ambulatory Visit
Admission: RE | Admit: 2016-01-03 | Discharge: 2016-01-03 | Disposition: A | Discharge status: Home / Self Care | Payer: BC Managed Care – PPO | Source: Ambulatory Visit | Attending: Family | Admitting: Family

## 2016-01-03 DIAGNOSIS — Z1231 Encounter for screening mammogram for malignant neoplasm of breast: Secondary | ICD-10-CM

## 2016-01-03 DIAGNOSIS — R928 Other abnormal and inconclusive findings on diagnostic imaging of breast: Secondary | ICD-10-CM | POA: Insufficient documentation

## 2016-01-03 DIAGNOSIS — Z7689 Persons encountering health services in other specified circumstances: Secondary | ICD-10-CM

## 2016-01-04 ENCOUNTER — Other Ambulatory Visit: Payer: Self-pay | Admitting: Family

## 2016-01-04 DIAGNOSIS — N6489 Other specified disorders of breast: Secondary | ICD-10-CM

## 2016-01-06 NOTE — Progress Notes (Signed)
Letter has been mailed to patient for results.

## 2016-01-09 ENCOUNTER — Ambulatory Visit (INDEPENDENT_AMBULATORY_CARE_PROVIDER_SITE_OTHER): Payer: BC Managed Care – PPO | Admitting: Family

## 2016-01-09 ENCOUNTER — Encounter: Payer: Self-pay | Admitting: Family

## 2016-01-09 VITALS — BP 100/66 | HR 76 | Temp 98.2°F | Ht 67.0 in | Wt 221.2 lb

## 2016-01-09 DIAGNOSIS — I10 Essential (primary) hypertension: Secondary | ICD-10-CM

## 2016-01-09 DIAGNOSIS — Z23 Encounter for immunization: Secondary | ICD-10-CM

## 2016-01-09 DIAGNOSIS — E119 Type 2 diabetes mellitus without complications: Secondary | ICD-10-CM | POA: Diagnosis not present

## 2016-01-09 DIAGNOSIS — Z0000 Encounter for general adult medical examination without abnormal findings: Secondary | ICD-10-CM | POA: Diagnosis not present

## 2016-01-09 MED ORDER — GLIPIZIDE 5 MG PO TABS: 5.0000 mg | ORAL_TABLET | Freq: Every day | ORAL | 1 refills | Status: DC

## 2016-01-09 NOTE — Progress Notes (Signed)
Subjective:    Patient ID: Cassandra [name], female    DOB: 1965/12/26, 50 y.o.   MRN: [id number]  CC: Cassandra [name] is a 50 y.o. female who presents today for physical exam.    HPI: She here for physical exam. No complaints today.  DM- A1C 3 weeks ago 9.1%. Complaint with medication. Cannot afford victoza. Fasting BS 280s in the AM.       Colorectal Cancer Screening: Not due yet. No significant family risk. Breast Cancer Screening: Mammogram UTD; needs repeat diagnostic imaging of left breast. Patient states she has h/o of left breast mass 2 years ago; doesn't want to go back for repeat diagnostic.  Cervical Cancer Screening: Normal per patient 2015; recently referred to GYN for h/o fibroids. Due for Pap. Appt scheduled 10/10.  Immunizations       Tetanus - UTD        Pneumococcal - Done 2015. Labs: Labs done at last visit.  Exercise: Gets regular exercise.  Alcohol use: No.  Smoking/tobacco use: Nonsmoker.    HISTORY:  Past Medical History:  Diagnosis Date  . Chicken pox   . Diabetes mellitus without complication ([place])   . Herpes   . Hypertension     Past Surgical History:  Procedure Laterality Date  . CHOLECYSTECTOMY  2015  . HERNIA REPAIR     50 years old  . TUBAL LIGATION  1991   Family History  Problem Relation Age of Onset  . Hypertension Mother   . Diabetes Mother   . Breast cancer Neg Hx       ALLERGIES: Review of patient's allergies indicates no known allergies.  Current Outpatient Prescriptions on File Prior to Visit  Medication Sig Dispense Refill  . albuterol (PROVENTIL HFA) 108 (90 Base) MCG/ACT inhaler Inhale 2 puffs into the lungs every 6 (six) hours as needed for wheezing or shortness of breath. 1 Inhaler 1  . cholecalciferol (VITAMIN D) 1000 UNITS tablet Take 1,000 Units by mouth daily.    Marland Kitchen lisinopril-hydrochlorothiazide (PRINZIDE,ZESTORETIC) 10-12.5 MG tablet TAKE ONE TABLET BY MOUTH ONCE DAILY 30 tablet 11  . metFORMIN  (GLUCOPHAGE) 1000 MG tablet Take 1 tablet (1,000 mg total) by mouth 2 (two) times daily. 60 tablet 3  . valACYclovir (VALTREX) 500 MG tablet Take 1 tablet twice daily by mouth for 3 days at first sign of outbreak 30 tablet 0   No current facility-administered medications on file prior to visit.     Social History  Substance Use Topics  . Smoking status: Never Smoker  . Smokeless tobacco: Never Used  . Alcohol use No    Review of Systems  Constitutional: Negative for chills, fever and unexpected weight change.  HENT: Negative for congestion.   Respiratory: Negative for cough.   Cardiovascular: Negative for chest pain, palpitations and leg swelling.  Gastrointestinal: Negative for nausea and vomiting.  Musculoskeletal: Negative for arthralgias and myalgias.  Skin: Negative for rash.  Neurological: Negative for headaches.  Hematological: Negative for adenopathy.  Psychiatric/Behavioral: Negative for confusion.      Objective:    BP 100/66   Pulse 76   Temp 98.2 F (36.8 C) (Oral)   Ht _0  (1.702 m)   Wt 221 lb 3.2 oz (100.3 kg)   LMP 12/15/2015   SpO2 98%   BMI 34.64 kg/m   BP Readings from Last 3 Encounters:  01/09/16 100/66  12/15/15 132/80  11/01/15 104/82   Wt Readings from Last 3 Encounters:  01/09/16 221  lb 3.2 oz (100.3 kg)  12/15/15 220 lb 12.8 oz (100.2 kg)  11/01/15 225 lb (102.1 kg)    Physical Exam  Constitutional: She appears well-developed and well-nourished.  Eyes: Conjunctivae are normal.  Neck: No thyroid mass and no thyromegaly present.  Cardiovascular: Normal rate, regular rhythm, normal heart sounds and normal pulses.   Pulmonary/Chest: Effort normal and breath sounds normal. She has no wheezes. She has no rhonchi. She has no rales. Right breast exhibits no inverted nipple, no mass, no nipple discharge, no skin change and no tenderness. Left breast exhibits no inverted nipple, no mass, no nipple discharge, no skin change and no tenderness.  Breasts are symmetrical.  CBE performed.   Lymphadenopathy:       Head (right side): No submental, no submandibular, no tonsillar, no preauricular, no posterior auricular and no occipital adenopathy present.       Head (left side): No submental, no submandibular, no tonsillar, no preauricular, no posterior auricular and no occipital adenopathy present.    She has no cervical adenopathy.       Right cervical: No superficial cervical, no deep cervical and no posterior cervical adenopathy present.      Left cervical: No superficial cervical, no deep cervical and no posterior cervical adenopathy present.    She has no axillary adenopathy.  Neurological: She is alert.  Skin: Skin is warm and dry.  Psychiatric: She has a normal mood and affect. Her speech is normal and behavior is normal. Thought content normal.  Vitals reviewed.      Assessment & Plan:   Problem List Items Addressed This Visit      Endocrine   Diabetes mellitus type 2, controlled, without complications ([place])    Uncontrolled based on home blood sugars. Due to patient's concern for cough, we jointly agreed to start glipizide QAmwith her metformin. These medications can cause hypoglycemia when taken together from synergistic effects and I explained this to patient. We jointly decided based on her concern for cost, this was appropriate regimen for her as she was not able to afford the Victoza. Will check A1C  In 3 months at f/u. Will consider increasing glipizide to breakfast and lunch depending on A1C.      Relevant Medications   glipiZIDE (GLUCOTROL) 5 MG tablet     Other   Annual physical exam    Patient is not due for colonoscopy and she has no significant family risk. She had mammogram done 12/2015 and is being called back for repeat imaging of the left breast. Patient was resistant to go back as this was seen in the past however I strongly encouraged and emphasized her to go back to [place] for repeat imaging. I also told  patient if she did not get a call and appointment from [place] to let me know. Patient is pending appointment with GYN for history of fibroids as well as to have her Pap smear done. She is up-to-date on immunizations. Screening labs were done at prior visit. Encouraged DM diet and exercise.       Other Visit Diagnoses    Essential hypertension    -  Primary   Relevant Orders   Comp Met (CMET)   Type 2 diabetes mellitus without complication, without long-term current use of insulin (HCC)       Relevant Medications   glipiZIDE (GLUCOTROL) 5 MG tablet       I am having Ms. Pedro start on glipiZIDE. I am also having her maintain  her cholecalciferol, valACYclovir, lisinopril-hydrochlorothiazide, albuterol, and metFORMIN.   Meds ordered this encounter  Medications  . glipiZIDE (GLUCOTROL) 5 MG tablet    Sig: Take 1 tablet (5 mg total) by mouth daily before breakfast.    Dispense:  90 tablet    Refill:  1    Order Specific Question:   Supervising Provider    Answer:   Crecencio Mc [[id number]]    Return precautions given.   Risks, benefits, and alternatives of the medications and treatment plan prescribed today were discussed, and patient expressed understanding.   Education regarding symptom management and diagnosis given to patient on AVS.   Continue to follow with Mable Paris, FNP for routine health maintenance.   Millee D Brindley and I agreed with plan.   Mable Paris, FNP

## 2016-01-09 NOTE — Assessment & Plan Note (Addendum)
Uncontrolled based on home blood sugars. Due to patient's concern for cough, we jointly agreed to start glipizide QAmwith her metformin. These medications can cause hypoglycemia when taken together from synergistic effects and I explained this to patient. We jointly decided based on her concern for cost, this was appropriate regimen for her as she was not able to afford the Victoza. Will check A1C  In 3 months at f/u. Will consider increasing glipizide to breakfast and lunch depending on A1C.

## 2016-01-09 NOTE — Patient Instructions (Addendum)
Start glipizide with breakfast; may start taking with lunch if sugars stay high.  Please very careful for low blood sugars -- this medication with Metformin can cause low blood sugars.  Please keep log and follow up in a couple of months.   Basic Carbohydrate Counting for Diabetes Mellitus Carbohydrate counting is a method for keeping track of the amount of carbohydrates you eat. Eating carbohydrates naturally increases the level of sugar (glucose) in your blood, so it is important for you to know the amount that is okay for you to have in every meal. Carbohydrate counting helps keep the level of glucose in your blood within normal limits. The amount of carbohydrates allowed is different for every person. A dietitian can help you calculate the amount that is right for you. Once you know the amount of carbohydrates you can have, you can count the carbohydrates in the foods you want to eat. Carbohydrates are found in the following foods:  Grains, such as breads and cereals.  Dried beans and soy products.  Starchy vegetables, such as potatoes, peas, and corn.  Fruit and fruit juices.  Milk and yogurt.  Sweets and snack foods, such as cake, cookies, candy, chips, soft drinks, and fruit drinks. CARBOHYDRATE COUNTING There are two ways to count the carbohydrates in your food. You can use either of the methods or a combination of both. Reading the "Nutrition Facts" on Packaged Food The "Nutrition Facts" is an area that is included on the labels of almost all packaged food and beverages in the [place]. It includes the serving size of that food or beverage and information about the nutrients in each serving of the food, including the grams (g) of carbohydrate per serving.  Decide the number of servings of this food or beverage that you will be able to eat or drink. Multiply that number of servings by the number of grams of carbohydrate that is listed on the label for that serving. The total will  be the amount of carbohydrates you will be having when you eat or drink this food or beverage. Learning Standard Serving Sizes of Food When you eat food that is not packaged or does not include "Nutrition Facts" on the label, you need to measure the servings in order to count the amount of carbohydrates.A serving of most carbohydrate-rich foods contains about 15 g of carbohydrates. The following list includes serving sizes of carbohydrate-rich foods that provide 15 g ofcarbohydrate per serving:   1 slice of bread (1 oz) or 1 six-inch tortilla.    of a hamburger bun or English muffin.  4-6 crackers.   cup unsweetened dry cereal.    cup hot cereal.   cup rice or pasta.    cup mashed potatoes or  of a large baked potato.  1 cup fresh fruit or one small piece of fruit.    cup canned or frozen fruit or fruit juice.  1 cup milk.   cup plain fat-free yogurt or yogurt sweetened with artificial sweeteners.   cup cooked dried beans or starchy vegetable, such as peas, corn, or potatoes.  Decide the number of standard-size servings that you will eat. Multiply that number of servings by 15 (the grams of carbohydrates in that serving). For example, if you eat 2 cups of strawberries, you will have eaten 2 servings and 30 g of carbohydrates (2 servings x 15 g = 30 g). For foods such as soups and casseroles, in which more than one food is mixed in,  you will need to count the carbohydrates in each food that is included. EXAMPLE OF CARBOHYDRATE COUNTING Sample Dinner  3 oz chicken breast.   cup of brown rice.   cup of corn.  1 cup milk.   1 cup strawberries with sugar-free whipped topping.  Carbohydrate Calculation Step 1: Identify the foods that contain carbohydrates:   Rice.   Corn.   Milk.   Strawberries. Step 2:Calculate the number of servings eaten of each:   2 servings of rice.   1 serving of corn.   1 serving of milk.   1 serving of  strawberries. Step 3: Multiply each of those number of servings by 15 g:   2 servings of rice x 15 g = 30 g.   1 serving of corn x 15 g = 15 g.   1 serving of milk x 15 g = 15 g.   1 serving of strawberries x 15 g = 15 g. Step 4: Add together all of the amounts to find the total grams of carbohydrates eaten: 30 g + 15 g + 15 g + 15 g = 75 g.   This information is not intended to replace advice given to you by your health care provider. Make sure you discuss any questions you have with your health care provider.   Document Released: 04/02/2005 Document Revised: 04/23/2014 Document Reviewed: 02/27/2013 Elsevier Interactive Patient Education [employer].

## 2016-01-09 NOTE — Progress Notes (Signed)
Pre visit review using our clinic review tool, if applicable. No additional management support is needed unless otherwise documented below in the visit note. 

## 2016-01-09 NOTE — Assessment & Plan Note (Signed)
Patient is not due for colonoscopy and she has no significant family risk. She had mammogram done 12/2015 and is being called back for repeat imaging of the left breast. Patient was resistant to go back as this was seen in the past however I strongly encouraged and emphasized her to go back to [facility] for repeat imaging. I also told patient if she did not get a call and appointment from imaging Center to let me know. Patient is pending appointment with GYN for history of fibroids as well as to have her Pap smear done. She is up-to-date on immunizations. Screening labs were done at prior visit. Encouraged DM diet and exercise.

## 2016-04-11 ENCOUNTER — Encounter: Payer: Self-pay | Admitting: Family

## 2016-04-11 ENCOUNTER — Ambulatory Visit (INDEPENDENT_AMBULATORY_CARE_PROVIDER_SITE_OTHER): Payer: BC Managed Care – PPO | Admitting: Family

## 2016-04-11 VITALS — BP 110/70 | HR 88 | Temp 98.5°F | Ht 67.0 in | Wt 221.4 lb

## 2016-04-11 DIAGNOSIS — E119 Type 2 diabetes mellitus without complications: Secondary | ICD-10-CM

## 2016-04-11 LAB — COMPREHENSIVE METABOLIC PANEL
ALT: 25 U/L (ref 0–35)
AST: 16 U/L (ref 0–37)
Albumin: 3.9 g/dL (ref 3.5–5.2)
Alkaline Phosphatase: 67 U/L (ref 39–117)
BUN: 8 mg/dL (ref 6–23)
CHLORIDE: 98 meq/L (ref 96–112)
CO2: 26 meq/L (ref 19–32)
Calcium: 9.7 mg/dL (ref 8.4–10.5)
Creatinine, Ser: 0.7 mg/dL (ref 0.40–1.20)
GFR: 114.17 mL/min (ref >60.00)
GLUCOSE: 342 mg/dL — AB (ref 70–99)
POTASSIUM: 3.9 meq/L (ref 3.5–5.1)
SODIUM: 134 meq/L — AB (ref 135–145)
Total Bilirubin: 0.3 mg/dL (ref 0.2–1.2)
Total Protein: 7.1 g/dL (ref 6.0–8.3)

## 2016-04-11 LAB — HEMOGLOBIN A1C: HEMOGLOBIN A1C: 11.9 % — AB (ref 4.6–6.5)

## 2016-04-11 MED ORDER — LIRAGLUTIDE 18 MG/3ML ~~LOC~~ SOPN: PEN_INJECTOR | SUBCUTANEOUS | 12 refills | Status: DC

## 2016-04-11 NOTE — Assessment & Plan Note (Signed)
Uncontrolled diabetes. Restart victoza because it has worked well patient past. Provided information with drug coupon so she can afford this medication. Encouraged patient to make annual eye exam. Labs today. Follow-up 3 months.

## 2016-04-11 NOTE — Patient Instructions (Addendum)
Due for Pap; please ensure you make an appt since you have had abnormal  Labs today  Lets restart victoza  F/u 3 months

## 2016-04-11 NOTE — Progress Notes (Signed)
Subjective:    Patient ID: Burnis KingfisherCassandra D Moncus, female    DOB: 1965-04-27, 50 y.o.   MRN: [id number]  CC: Burnis KingfisherCassandra D Maack is a 50 y.o. female who presents today for follow up.   HPI: DM- Blood sugar averages 240. No blurry vision. Endorses polyuria. Tried glipizide and stopped because felt SOB, which resolved when stopped medication. Noticed immediately after taking it, chest would tighten up. 'not an elephant on chest.' No wheezing, cough. Denies exertional chest pain or pressure, numbness or tingling radiating to left arm or jaw, palpitations, dizziness, frequent headaches, changes in vision, or shortness of breath.  No CP, SOB today.Victoza worked well in past.         HISTORY:  Past Medical History:  Diagnosis Date  . Chicken pox   . Diabetes mellitus without complication (HCC)   . Herpes   . Hypertension    Past Surgical History:  Procedure Laterality Date  . CHOLECYSTECTOMY  2015  . HERNIA REPAIR     50 years old  . TUBAL LIGATION  1991   Family History  Problem Relation Age of Onset  . Hypertension Mother   . Diabetes Mother   . Breast cancer Neg Hx     Allergies: Glipizide Current Outpatient Prescriptions on File Prior to Visit  Medication Sig Dispense Refill  . albuterol (PROVENTIL HFA) 108 (90 Base) MCG/ACT inhaler Inhale 2 puffs into the lungs every 6 (six) hours as needed for wheezing or shortness of breath. 1 Inhaler 1  . cholecalciferol (VITAMIN D) 1000 UNITS tablet Take 1,000 Units by mouth daily.    Marland Kitchen. lisinopril-hydrochlorothiazide (PRINZIDE,ZESTORETIC) 10-12.5 MG tablet TAKE ONE TABLET BY MOUTH ONCE DAILY 30 tablet 11  . metFORMIN (GLUCOPHAGE) 1000 MG tablet Take 1 tablet (1,000 mg total) by mouth 2 (two) times daily. 60 tablet 3  . valACYclovir (VALTREX) 500 MG tablet Take 1 tablet twice daily by mouth for 3 days at first sign of outbreak 30 tablet 0   No current facility-administered medications on file prior to visit.     Social History    Substance Use Topics  . Smoking status: Never Smoker  . Smokeless tobacco: Never Used  . Alcohol use No    Review of Systems  Constitutional: Negative for chills and fever.  Eyes: Negative for visual disturbance.  Respiratory: Negative for cough.   Cardiovascular: Negative for chest pain and palpitations.  Gastrointestinal: Negative for nausea and vomiting.  Endocrine: Positive for polyuria.      Objective:    BP 110/70   Pulse 88   Temp 98.5 F (36.9 C) (Oral)   Ht 5\' 7"  (1.702 m)   Wt 221 lb 6.4 oz (100.4 kg)   LMP 03/13/2016   SpO2 98%   BMI 34.68 kg/m  BP Readings from Last 3 Encounters:  04/11/16 110/70  01/09/16 100/66  12/15/15 132/80   Wt Readings from Last 3 Encounters:  04/11/16 221 lb 6.4 oz (100.4 kg)  01/09/16 221 lb 3.2 oz (100.3 kg)  12/15/15 220 lb 12.8 oz (100.2 kg)    Physical Exam  Constitutional: She appears well-developed and well-nourished.  Eyes: Conjunctivae are normal.  Cardiovascular: Normal rate, regular rhythm, normal heart sounds and normal pulses.   Pulmonary/Chest: Effort normal and breath sounds normal. She has no wheezes. She has no rhonchi. She has no rales.  Neurological: She is alert.  Skin: Skin is warm and dry.  Psychiatric: She has a normal mood and affect. Her speech is normal and  behavior is normal. Thought content normal.  Vitals reviewed.      Assessment & Plan:   Problem List Items Addressed This Visit      Endocrine   Diabetes mellitus type 2, controlled, without complications (HCC) - Primary    Uncontrolled diabetes. Restart victoza because it has worked well patient past. Provided information with drug coupon so she can afford this medication. Encouraged patient to make annual eye exam. Labs today. Follow-up 3 months.      Relevant Medications   liraglutide 18 MG/3ML SOPN   Other Relevant Orders   Comprehensive metabolic panel   Hemoglobin A1c       I have discontinued Ms. Buescher's glipiZIDE. I am  also having her maintain her cholecalciferol, valACYclovir, lisinopril-hydrochlorothiazide, albuterol, metFORMIN, and liraglutide.   Meds ordered this encounter  Medications  . liraglutide 18 MG/3ML SOPN    Sig: Start 0.6 mg [place] qd x 1 week, then 1.2 mg  qd thereafter    Dispense:  6 mL    Refill:  12    Order Specific Question:   Supervising Provider    Answer:   Sherlene ShamsULLO, TERESA L [[id number]]    Return precautions given.   Risks, benefits, and alternatives of the medications and treatment plan prescribed today were discussed, and patient expressed understanding.   Education regarding symptom management and diagnosis given to patient on AVS.  Continue to follow with Rennie PlowmanMargaret Syniyah Bourne, FNP for routine health maintenance.   Sherra D Reimann and I agreed with plan.   Rennie PlowmanMargaret Janisa Labus, FNP

## 2016-04-11 NOTE — Progress Notes (Signed)
Pre visit review using our clinic review tool, if applicable. No additional management support is needed unless otherwise documented below in the visit note. 

## 2016-06-28 ENCOUNTER — Other Ambulatory Visit: Payer: Self-pay | Admitting: Nurse Practitioner

## 2016-06-28 ENCOUNTER — Other Ambulatory Visit: Payer: Self-pay | Admitting: Family

## 2016-07-06 ENCOUNTER — Other Ambulatory Visit: Payer: Self-pay

## 2016-07-06 MED ORDER — METFORMIN HCL 1000 MG PO TABS: 1000.0000 mg | ORAL_TABLET | Freq: Two times a day (BID) | ORAL | 3 refills | Status: DC

## 2016-07-06 NOTE — Telephone Encounter (Signed)
Medication has been refilled per protocol.  

## 2016-07-11 ENCOUNTER — Ambulatory Visit (INDEPENDENT_AMBULATORY_CARE_PROVIDER_SITE_OTHER): Payer: BC Managed Care – PPO | Admitting: Family

## 2016-07-11 ENCOUNTER — Encounter: Payer: Self-pay | Admitting: Family

## 2016-07-11 VITALS — BP 116/66 | HR 88 | Temp 98.2°F | Ht 67.0 in | Wt 218.0 lb

## 2016-07-11 DIAGNOSIS — E119 Type 2 diabetes mellitus without complications: Secondary | ICD-10-CM

## 2016-07-11 DIAGNOSIS — I10 Essential (primary) hypertension: Secondary | ICD-10-CM | POA: Diagnosis not present

## 2016-07-11 LAB — COMPREHENSIVE METABOLIC PANEL
ALBUMIN: 3.9 g/dL (ref 3.5–5.2)
ALK PHOS: 62 U/L (ref 39–117)
ALT: 15 U/L (ref 0–35)
AST: 12 U/L (ref 0–37)
BILIRUBIN TOTAL: 0.4 mg/dL (ref 0.2–1.2)
BUN: 15 mg/dL (ref 6–23)
CO2: 27 mEq/L (ref 19–32)
CREATININE: 0.65 mg/dL (ref 0.40–1.20)
Calcium: 8.9 mg/dL (ref 8.4–10.5)
Chloride: 104 mEq/L (ref 96–112)
GFR: 124.23 mL/min (ref >60.00)
GLUCOSE: 172 mg/dL — AB (ref 70–99)
POTASSIUM: 4.2 meq/L (ref 3.5–5.1)
SODIUM: 136 meq/L (ref 135–145)
TOTAL PROTEIN: 7.2 g/dL (ref 6.0–8.3)

## 2016-07-11 LAB — HEMOGLOBIN A1C: HEMOGLOBIN A1C: 9.4 % — AB (ref 4.6–6.5)

## 2016-07-11 NOTE — Patient Instructions (Signed)
You are doing awesome ! Good for you.  Labs today.  Follow 3 months

## 2016-07-11 NOTE — Assessment & Plan Note (Addendum)
Last a1c 3 months ago 11. Very pleased to hear improvement regarding diet, lifestyle, and added Victoza. Pending A1c will continue current regimen and not adjust (quite yet) as long a1c improving. Appears to have diabetic neuropathy ; sensation intact with microfilament exam. I discussed adding medication ,gabapentin, and patient would prefer to control blood sugars and see if the symptom improves.

## 2016-07-11 NOTE — Progress Notes (Signed)
Pre visit review using our clinic review tool, if applicable. No additional management support is needed unless otherwise documented below in the visit note. 

## 2016-07-11 NOTE — Assessment & Plan Note (Signed)
At goal. Continue current regimen. Pending CMP.  

## 2016-07-11 NOTE — Progress Notes (Signed)
Subjective:    Patient ID: Cassandra [name], female    DOB: 1965-05-13, 51 y.o.   MRN: [id number]  CC: Cassandra [name] is a 51 y.o. female who presents today for follow up.   HPI: DM- restarted victoza a couple of months ago. Post prandial BS 160.   Tingling and burning of left foot noticed over past few weeks.  No swelling, SOB.   Has joined a gym and cutting out pepsi's.     HISTORY:  Past Medical History:  Diagnosis Date  . Chicken pox   . Diabetes mellitus without complication (HCC)   . Herpes   . Hypertension    Past Surgical History:  Procedure Laterality Date  . CHOLECYSTECTOMY  2015  . HERNIA REPAIR     51 years old  . TUBAL LIGATION  1991   Family History  Problem Relation Age of Onset  . Hypertension Mother   . Diabetes Mother   . Breast cancer Neg Hx     Allergies: Glipizide Current Outpatient Prescriptions on File Prior to Visit  Medication Sig Dispense Refill  . albuterol (PROVENTIL HFA) 108 (90 Base) MCG/ACT inhaler Inhale 2 puffs into the lungs every 6 (six) hours as needed for wheezing or shortness of breath. 1 Inhaler 1  . cholecalciferol (VITAMIN D) 1000 UNITS tablet Take 1,000 Units by mouth daily.    Marland Kitchen liraglutide 18 MG/3ML SOPN Start 0.6 mg [place] qd x 1 week, then 1.2 mg [place] qd thereafter 6 mL 12  . lisinopril-hydrochlorothiazide (PRINZIDE,ZESTORETIC) 10-12.5 MG tablet TAKE ONE TABLET BY MOUTH ONCE DAILY 30 tablet 11  . metFORMIN (GLUCOPHAGE) 1000 MG tablet Take 1 tablet (1,000 mg total) by mouth 2 (two) times daily. 60 tablet 3  . valACYclovir (VALTREX) 500 MG tablet Take 1 tablet twice daily by mouth for 3 days at first sign of outbreak 30 tablet 0   No current facility-administered medications on file prior to visit.     Social History  Substance Use Topics  . Smoking status: Never Smoker  . Smokeless tobacco: Never Used  . Alcohol use No    Review of Systems    Objective:    BP 116/66   Pulse 88   Temp 98.2 F (36.8 C) (Oral)    Ht 5\' 7"  (1.702 m)   Wt 218 lb (98.9 kg)   BMI 34.14 kg/m  BP Readings from Last 3 Encounters:  07/11/16 116/66  04/11/16 110/70  01/09/16 100/66   Wt Readings from Last 3 Encounters:  07/11/16 218 lb (98.9 kg)  04/11/16 221 lb 6.4 oz (100.4 kg)  01/09/16 221 lb 3.2 oz (100.3 kg)    Physical Exam     Assessment & Plan:   Problem List Items Addressed This Visit      Cardiovascular and Mediastinum   Benign essential HTN    At goal. Continue current regimen. Pending CMP.      Relevant Orders   Comprehensive metabolic panel     Endocrine   Diabetes mellitus type 2, controlled, without complications (HCC) - Primary    Last a1c 3 months ago 11. Very pleased to hear improvement regarding diet, lifestyle, and added Victoza. Pending A1c will continue current regimen and not adjust (quite yet) as long a1c improving. Appears to have diabetic neuropathy ; sensation intact with microfilament exam. I discussed adding medication ,gabapentin, and patient would prefer to control blood sugars and see if the symptom improves.      Relevant Orders  Hemoglobin A1c       I am having [name] maintain her cholecalciferol, valACYclovir, albuterol, liraglutide, lisinopril-hydrochlorothiazide, and metFORMIN.   No orders of the defined types were placed in this encounter.   Return precautions given.   Risks, benefits, and alternatives of the medications and treatment plan prescribed today were discussed, and patient expressed understanding.   Education regarding symptom management and diagnosis given to patient on AVS.  Continue to follow with Rennie PlowmanMargaret Jacquese Hackman, FNP for routine health maintenance.   Shizuko D Doolittle and I agreed with plan.   Rennie PlowmanMargaret Rilyn Upshaw, FNP

## 2016-07-17 ENCOUNTER — Telehealth: Payer: Self-pay | Admitting: Family

## 2016-07-17 NOTE — Telephone Encounter (Signed)
,  Patient was informed of results.  Patient understood and no questions, comments, or concerns at this time.  

## 2016-07-17 NOTE — Telephone Encounter (Signed)
Pt called back returning your call. Thank you!  Call pt @ [phone]

## 2016-10-15 ENCOUNTER — Ambulatory Visit: Payer: Self-pay | Admitting: Family

## 2016-12-27 ENCOUNTER — Other Ambulatory Visit: Payer: Self-pay | Admitting: Family

## 2016-12-28 ENCOUNTER — Telehealth: Payer: Self-pay | Admitting: Family

## 2016-12-28 DIAGNOSIS — E119 Type 2 diabetes mellitus without complications: Secondary | ICD-10-CM

## 2016-12-28 NOTE — Telephone Encounter (Signed)
[facility] quality Metric needed A1c has appointment 01/03/17 ordered A1c fyi.

## 2016-12-31 NOTE — Addendum Note (Signed)
Addended by: Allegra Grana on: 12/31/2016 10:17 AM   Modules accepted: Orders

## 2016-12-31 NOTE — Telephone Encounter (Signed)
Noted thanks °

## 2017-01-03 ENCOUNTER — Encounter: Payer: Self-pay | Admitting: Family

## 2017-01-03 ENCOUNTER — Ambulatory Visit (INDEPENDENT_AMBULATORY_CARE_PROVIDER_SITE_OTHER): Payer: Self-pay | Admitting: Family

## 2017-01-03 VITALS — BP 110/60 | HR 89 | Temp 98.5°F | Ht 67.0 in | Wt 210.6 lb

## 2017-01-03 DIAGNOSIS — E119 Type 2 diabetes mellitus without complications: Secondary | ICD-10-CM

## 2017-01-03 DIAGNOSIS — Z1239 Encounter for other screening for malignant neoplasm of breast: Secondary | ICD-10-CM

## 2017-01-03 DIAGNOSIS — Z1231 Encounter for screening mammogram for malignant neoplasm of breast: Secondary | ICD-10-CM

## 2017-01-03 DIAGNOSIS — I10 Essential (primary) hypertension: Secondary | ICD-10-CM

## 2017-01-03 MED ORDER — DULAGLUTIDE 0.75 MG/0.5ML ~~LOC~~ SOAJ: 0.7500 mg | SUBCUTANEOUS | 5 refills | Status: DC

## 2017-01-03 NOTE — Patient Instructions (Addendum)
Trial truclicity  Labs today  Ensure pap smear with OB- referral placed  We placed a referral for mammogram this year. I asked that you call one the below locations and schedule this when it is convenient for you.   As discussed, I would like you to ask for 3D mammogram over the traditional 2D mammogram as new evidence suggest 3D is superior.   Please note that NOT all insurance companies cover 3D and you may have to pay a higher copay. You may call your insurance company to further clarify your benefits.   As discussed, please call [facility] for mammogram.   Schedule an appointment by calling [phone].

## 2017-01-03 NOTE — Progress Notes (Signed)
Subjective:    Patient ID: Cassandra [name], female    DOB: 09/13/1965, 51 y.o.   MRN: [id number]  CC: Cassandra [name] is a 51 y.o. female who presents today for follow up.   HPI: DM- FBG 180, 200's. Would prefer truclitiy over victoza due to number of injections.   HTN- Compliant with medication. Denies exertional chest pain or pressure, numbness or tingling radiating to left arm or jaw, palpitations, dizziness, frequent headaches, changes in vision, or shortness of breath.   Due for  Mammogram  Would like to establish with OB GYN        HISTORY:  Past Medical History:  Diagnosis Date  . Chicken pox   . Diabetes mellitus without complication (HCC)   . Herpes   . Hypertension    Past Surgical History:  Procedure Laterality Date  . CHOLECYSTECTOMY  2015  . HERNIA REPAIR     51 years old  . TUBAL LIGATION  1991   Family History  Problem Relation Age of Onset  . Hypertension Mother   . Diabetes Mother   . Breast cancer Neg Hx     Allergies: Glipizide Current Outpatient Prescriptions on File Prior to Visit  Medication Sig Dispense Refill  . albuterol (PROVENTIL HFA) 108 (90 Base) MCG/ACT inhaler Inhale 2 puffs into the lungs every 6 (six) hours as needed for wheezing or shortness of breath. 1 Inhaler 1  . cholecalciferol (VITAMIN D) 1000 UNITS tablet Take 1,000 Units by mouth daily.    Marland Kitchen lisinopril-hydrochlorothiazide (PRINZIDE,ZESTORETIC) 10-12.5 MG tablet TAKE ONE TABLET BY MOUTH ONCE DAILY 30 tablet 11  . metFORMIN (GLUCOPHAGE) 1000 MG tablet TAKE 1 TABLET BY MOUTH TWICE DAILY 60 tablet 3  . valACYclovir (VALTREX) 500 MG tablet Take 1 tablet twice daily by mouth for 3 days at first sign of outbreak 30 tablet 0   No current facility-administered medications on file prior to visit.     Social History  Substance Use Topics  . Smoking status: Never Smoker  . Smokeless tobacco: Never Used  . Alcohol use No    Review of Systems  Constitutional: Negative  for chills and fever.  Respiratory: Negative for cough.   Cardiovascular: Negative for chest pain and palpitations.  Gastrointestinal: Negative for nausea and vomiting.      Objective:    BP 110/60   Pulse 89   Temp 98.5 F (36.9 C) (Oral)   Ht  (1.702 m)   Wt 210 lb 9.6 oz (95.5 kg)   SpO2 97%   BMI 32.98 kg/m  BP Readings from Last 3 Encounters:  01/03/17 110/60  07/11/16 116/66  04/11/16 110/70   Wt Readings from Last 3 Encounters:  01/03/17 210 lb 9.6 oz (95.5 kg)  07/11/16 218 lb (98.9 kg)  04/11/16 221 lb 6.4 oz (100.4 kg)    Physical Exam  Constitutional: She appears well-developed and well-nourished.  Eyes: Conjunctivae are normal.  Cardiovascular: Normal rate, regular rhythm, normal heart sounds and normal pulses.   Pulmonary/Chest: Effort normal and breath sounds normal. She has no wheezes. She has no rhonchi. She has no rales.  Neurological: She is alert.  Skin: Skin is warm and dry.  Psychiatric: She has a normal mood and affect. Her speech is normal and behavior is normal. Thought content normal.  Vitals reviewed.      Assessment & Plan:   Problem List Items Addressed This Visit      Cardiovascular and Mediastinum   Benign essential HTN  Controlled. Will continue current regimen.       Relevant Orders   Basic metabolic panel (Completed)     Endocrine   Diabetes mellitus type 2, controlled, without complications (HCC) - Primary    Will stop victoza and start trulicity. Will monitor a1c in 3 months.       Relevant Medications   Dulaglutide (TRULICITY) 0.75 MG/0.5ML SOPN   Other Relevant Orders   Hemoglobin A1c (Completed)     Other   Screening for breast cancer    Ordered. Patient understands to schedule.       Relevant Orders   MM SCREENING BREAST TOMO BILATERAL   Ambulatory referral to Obstetrics / Gynecology       I have discontinued Ms. Marinaccio's liraglutide. I am also having her start on Dulaglutide. Additionally, I am  having her maintain her cholecalciferol, valACYclovir, albuterol, lisinopril-hydrochlorothiazide, and metFORMIN.   Meds ordered this encounter  Medications  . Dulaglutide (TRULICITY) 0.75 MG/0.5ML SOPN    Sig: Inject 0.75 mg into the skin once a week.    Dispense:  1 pen    Refill:  5    Order Specific Question:   Supervising Provider    Answer:   Sherlene Shams [[id number]]    Return precautions given.   Risks, benefits, and alternatives of the medications and treatment plan prescribed today were discussed, and patient expressed understanding.   Education regarding symptom management and diagnosis given to patient on AVS.  Continue to follow with Allegra Grana, FNP for routine health maintenance.   Itza D Peaden and I agreed with plan.   Rennie Plowman, FNP

## 2017-01-04 LAB — BASIC METABOLIC PANEL
BUN: 14 mg/dL (ref 6–23)
CALCIUM: 9.1 mg/dL (ref 8.4–10.5)
CO2: 26 meq/L (ref 19–32)
CREATININE: 0.72 mg/dL (ref 0.40–1.20)
Chloride: 104 mEq/L (ref 96–112)
GFR: 110.18 mL/min (ref >60.00)
GLUCOSE: 141 mg/dL — AB (ref 70–99)
Potassium: 3.6 mEq/L (ref 3.5–5.1)
SODIUM: 137 meq/L (ref 135–145)

## 2017-01-04 LAB — HEMOGLOBIN A1C: Hgb A1c MFr Bld: 9.1 % — ABNORMAL HIGH (ref 4.6–6.5)

## 2017-01-05 NOTE — Assessment & Plan Note (Signed)
Ordered. Patient understands to schedule 

## 2017-01-05 NOTE — Assessment & Plan Note (Signed)
Controlled. Will continue current regimen 

## 2017-01-05 NOTE — Assessment & Plan Note (Signed)
Will stop victoza and start trulicity. Will monitor a1c in 3 months.

## 2017-02-13 ENCOUNTER — Telehealth: Payer: Self-pay | Admitting: Family

## 2017-02-13 DIAGNOSIS — R928 Other abnormal and inconclusive findings on diagnostic imaging of breast: Secondary | ICD-10-CM

## 2017-02-13 NOTE — Telephone Encounter (Signed)
Tried scheduling pt's screening mammogram that has been ordered, but the order needs to be changed to a diagnostic since her last mammo was birad's 0. Please enter [id number]; IMG [phone]

## 2017-02-15 NOTE — Telephone Encounter (Signed)
Order has been placed.

## 2017-03-22 ENCOUNTER — Ambulatory Visit
Admission: RE | Admit: 2017-03-22 | Discharge: 2017-03-22 | Disposition: A | Discharge status: Home / Self Care | Payer: BLUE CROSS/BLUE SHIELD | Source: Ambulatory Visit | Attending: Family | Admitting: Family

## 2017-03-22 DIAGNOSIS — R928 Other abnormal and inconclusive findings on diagnostic imaging of breast: Secondary | ICD-10-CM | POA: Diagnosis present

## 2017-05-27 ENCOUNTER — Ambulatory Visit: Payer: Self-pay | Admitting: Advanced Practice Midwife

## 2017-05-31 ENCOUNTER — Other Ambulatory Visit: Payer: Self-pay | Admitting: Family

## 2017-06-03 ENCOUNTER — Encounter: Payer: Self-pay | Admitting: Advanced Practice Midwife

## 2017-06-03 ENCOUNTER — Ambulatory Visit (INDEPENDENT_AMBULATORY_CARE_PROVIDER_SITE_OTHER): Payer: BLUE CROSS/BLUE SHIELD | Admitting: Advanced Practice Midwife

## 2017-06-03 VITALS — BP 120/88 | HR 88 | Ht 67.0 in | Wt 212.0 lb

## 2017-06-03 DIAGNOSIS — Z01419 Encounter for gynecological examination (general) (routine) without abnormal findings: Secondary | ICD-10-CM | POA: Diagnosis not present

## 2017-06-03 DIAGNOSIS — N924 Excessive bleeding in the premenopausal period: Secondary | ICD-10-CM

## 2017-06-03 DIAGNOSIS — Z124 Encounter for screening for malignant neoplasm of cervix: Secondary | ICD-10-CM

## 2017-06-03 MED ORDER — NORETHINDRONE 0.35 MG PO TABS: 1.0000 | ORAL_TABLET | Freq: Every day | ORAL | 11 refills | Status: DC

## 2017-06-03 NOTE — Progress Notes (Signed)
Patient ID: Cassandra KingfisherCassandra D [name], female   DOB: 1966-01-18, 52 y.o.   MRN: [id number]      Gynecology Annual Exam  PCP: Allegra GranaArnett, Margaret G, FNP  Chief Complaint:  Chief Complaint  Patient presents with  . Gynecologic Exam    History of Present Illness:Patient is a [age] y.o. [id number] presents for annual exam. The patient has complaint today of heavy periods. She is interested in trying hormones for control of heavy bleeding. It has been a number of years since she has had a PAP smear.   LMP: Patient's last menstrual period was 05/18/2017 (exact date). Menarche:not applicable Average Interval: regular, 28 days Duration of flow: 6 days Heavy Menses: yes Clots: no Intermenstrual Bleeding: no Postcoital Bleeding: no Dysmenorrhea: no  The patient is sexually active. She denies dyspareunia.  The patient does not perform self breast exams.  There is no notable family history of breast or ovarian cancer in her family.  The patient wears seatbelts: yes.   The patient has regular exercise: yes.    The patient denies current symptoms of depression.     Review of Systems: Review of Systems  Constitutional: Negative.   HENT: Negative.   Eyes: Negative.   Respiratory: Negative.   Cardiovascular: Negative.   Gastrointestinal: Negative.   Genitourinary: Negative.   Musculoskeletal: Negative.   Skin: Negative.   Neurological: Negative.   Endo/Heme/Allergies: Negative.   Psychiatric/Behavioral: Negative.     Past Medical History:  Past Medical History:  Diagnosis Date  . Chicken pox   . Diabetes mellitus without complication (HCC)   . Herpes   . Hypertension     Past Surgical History:  Past Surgical History:  Procedure Laterality Date  . CHOLECYSTECTOMY  2015  . HERNIA REPAIR     52 years old  . TUBAL LIGATION  1991    Gynecologic History:  Patient's last menstrual period was 05/18/2017 (exact date). Last Pap: uncertain Results were:  NIL and HR HPV+  Last mammogram: 1  month ago Results were: BI-RAD I  Obstetric History: [id number]: G2P2002  Family History:  Family History  Problem Relation Age of Onset  . Hypertension Mother   . Diabetes Mother   . Breast cancer Neg Hx     Social History:  Social History   Socioeconomic History  . Marital status: Single    Spouse name: Not on file  . Number of children: Not on file  . Years of education: Not on file  . Highest education level: Not on file  Social Needs  . Financial resource strain: Not on file  . Food insecurity - worry: Not on file  . Food insecurity - inability: Not on file  . Transportation needs - medical: Not on file  . Transportation needs - non-medical: Not on file  Occupational History  . Not on file  Tobacco Use  . Smoking status: Never Smoker  . Smokeless tobacco: Never Used  Substance and Sexual Activity  . Alcohol use: No    Alcohol/week: 0.0 oz  . Drug use: No  . Sexual activity: Yes    Birth control/protection: None  Other Topics Concern  . Not on file  Social History Narrative   2 children (Sons)    Single   [place] [employer]    [place]   Caffeine- Pepsi- x3 20 oz, no tea, coffee when cold- 1 cup   Exercise- wants to start a regimen.     Allergies:  Allergies  Allergen Reactions  . Glipizide  Chest tightness    Medications: Prior to Admission medications   Medication Sig Start Date End Date Taking? Authorizing Provider  Dulaglutide (TRULICITY) 0.75 MG/0.5ML SOPN Inject 0.75 mg into the skin once a week. 01/03/17  Yes Allegra Grana, FNP  lisinopril-hydrochlorothiazide (PRINZIDE,ZESTORETIC) 10-12.5 MG tablet TAKE ONE TABLET BY MOUTH ONCE DAILY 06/28/16  Yes Allegra Grana, FNP  metFORMIN (GLUCOPHAGE) 1000 MG tablet TAKE 1 TABLET BY MOUTH TWICE DAILY 12/27/16  Yes Arnett, Lyn Records, FNP  valACYclovir (VALTREX) 500 MG tablet Take 1 tablet twice daily by mouth for 3 days at first sign of outbreak 05/19/15  Yes Doss, Oleh Genin, RN    Physical Exam Vitals: Blood  pressure 120/88, pulse 88, height 5\' 7"  (1.702 m), weight 212 lb (96.2 kg), last menstrual period 05/18/2017.  General: NAD HEENT: normocephalic, anicteric Thyroid: no enlargement, no palpable nodules Pulmonary: No increased work of breathing, CTAB Cardiovascular: RRR, distal pulses 2+ Breast: Breast symmetrical, no tenderness, no palpable nodules or masses, no skin or nipple retraction present, no nipple discharge.  No axillary or supraclavicular lymphadenopathy. Abdomen: NABS, soft, non-tender, non-distended.  Umbilicus without lesions.  No hepatomegaly, splenomegaly or masses palpable. No evidence of hernia  Genitourinary:  External: Normal external female genitalia.  Normal urethral meatus, normal  Bartholin's and Skene's glands.    Vagina: Normal vaginal mucosa, no evidence of prolapse.    Cervix: Grossly normal in appearance, no bleeding, no CMT  Uterus: Non-enlarged, mobile, normal contour.    Adnexa: ovaries non-enlarged, no adnexal masses  Rectal: deferred  Lymphatic: no evidence of inguinal lymphadenopathy Extremities: no edema, erythema, or tenderness Neurologic: Grossly intact Psychiatric: mood appropriate, affect full   Assessment: 52 y.o. [id number] routine annual exam  Plan: Problem List Items Addressed This Visit    None    Visit Diagnoses    Well woman exam with routine gynecological exam    -  Primary   Relevant Orders   IGP, Aptima HPV   Cervical cancer screening       Relevant Orders   IGP, Aptima HPV      1) Mammogram - recommend yearly screening mammogram.  Mammogram Is up to date  2) STI screening was offered and declined  3) ASCCP guidelines and rational discussed.  Patient opts for every 3 years screening interval  4) Osteoporosis  - per USPTF routine screening DEXA at age 3   Consider FDA-approved medical therapies in postmenopausal women and men aged 49 years and older, based on the following: a) A hip or vertebral (clinical or morphometric)  fracture b) T-score ? -2.5 at the femoral neck or spine after appropriate evaluation to exclude secondary causes C) Low bone mass (T-score between -1.0 and -2.5 at the femoral neck or spine) and a 10-year probability of a hip fracture ? 3% or a 10-year probability of a major osteoporosis-related fracture ? 20% based on the [place]-adapted WHO algorithm   5) Routine healthcare maintenance including cholesterol, diabetes screening discussed managed by PCP  6) Colonoscopy: Patient had screening 2 years ago.  Screening recommended starting at age 49 for average risk individuals, age 30 for individuals deemed at increased risk (including African Americans) and recommended to continue until age 2.  For patient age 83-85 individualized approach is recommended.  Gold standard screening is via colonoscopy, Cologuard screening is an acceptable alternative for patient unwilling or unable to undergo colonoscopy.  "Colorectal cancer screening for average?risk adults: 2018 guideline update from the American Cancer Society"[place]: A Cancer Journal for  Clinicians: Sep 12, 2016   7) Return in 1 year (on 06/03/2018).   Tresea Mall, CNM [facility]

## 2017-06-05 LAB — IGP, APTIMA HPV
HPV Aptima: NEGATIVE
PAP Smear Comment: 0

## 2017-08-28 ENCOUNTER — Other Ambulatory Visit: Payer: Self-pay | Admitting: Family

## 2017-10-24 ENCOUNTER — Other Ambulatory Visit: Payer: Self-pay | Admitting: Family

## 2017-10-25 NOTE — Telephone Encounter (Signed)
lov 01/03/17 Last filled 07/10/17 No office visit scheduled

## 2017-10-25 NOTE — Telephone Encounter (Signed)
Call pt I have refilled her metformin however is her last refill until she is seen.  She needs follow-up blood pressure, diabetes and labs.  Please schedule

## 2017-10-29 NOTE — Telephone Encounter (Signed)
Patient advised needs to schedule follow up visit .  She will call back to schedule office visit.

## 2017-12-04 ENCOUNTER — Telehealth: Payer: Self-pay | Admitting: Family

## 2017-12-04 DIAGNOSIS — E119 Type 2 diabetes mellitus without complications: Secondary | ICD-10-CM

## 2017-12-04 MED ORDER — DULAGLUTIDE 0.75 MG/0.5ML ~~LOC~~ SOAJ: 0.7500 mg | SUBCUTANEOUS | 2 refills | Status: DC

## 2017-12-04 MED ORDER — METFORMIN HCL 1000 MG PO TABS: 1000.0000 mg | ORAL_TABLET | Freq: Two times a day (BID) | ORAL | 0 refills | Status: DC

## 2017-12-04 NOTE — Telephone Encounter (Signed)
Copied from CRM [phone]#[id number]. Topic: Quick Communication - Rx Refill/Question >> Dec 04, 2017  5:42 PM Raquel SarnaHayes, Teresa G wrote: Dulaglutide (TRULICITY) 0.75 MG/0.5ML SOPN metFORMIN (GLUCOPHAGE) 1000 MG tablet  [facility] -   Phone: [phone] (wantst this to be her new preferred pharmacy)

## 2017-12-25 ENCOUNTER — Encounter: Payer: Self-pay | Admitting: Family

## 2017-12-25 ENCOUNTER — Ambulatory Visit (INDEPENDENT_AMBULATORY_CARE_PROVIDER_SITE_OTHER): Payer: 59 | Admitting: Family

## 2017-12-25 VITALS — BP 114/64 | HR 91 | Temp 98.6°F | Resp 16 | Ht 66.5 in | Wt 208.1 lb

## 2017-12-25 DIAGNOSIS — I10 Essential (primary) hypertension: Secondary | ICD-10-CM | POA: Diagnosis not present

## 2017-12-25 DIAGNOSIS — E119 Type 2 diabetes mellitus without complications: Secondary | ICD-10-CM | POA: Diagnosis not present

## 2017-12-25 DIAGNOSIS — Z0000 Encounter for general adult medical examination without abnormal findings: Secondary | ICD-10-CM

## 2017-12-25 DIAGNOSIS — N920 Excessive and frequent menstruation with regular cycle: Secondary | ICD-10-CM | POA: Insufficient documentation

## 2017-12-25 DIAGNOSIS — N924 Excessive bleeding in the premenopausal period: Secondary | ICD-10-CM | POA: Diagnosis not present

## 2017-12-25 DIAGNOSIS — Z23 Encounter for immunization: Secondary | ICD-10-CM | POA: Diagnosis not present

## 2017-12-25 HISTORY — DX: Excessive and frequent menstruation with regular cycle: N92.0

## 2017-12-25 LAB — CBC WITH DIFFERENTIAL/PLATELET
Basophils Absolute: 0.1 10*3/uL (ref 0.0–0.1)
Basophils Relative: 1 % (ref 0.0–3.0)
EOS ABS: 0.1 10*3/uL (ref 0.0–0.7)
Eosinophils Relative: 1.6 % (ref 0.0–5.0)
HEMATOCRIT: 38 % (ref 36.0–46.0)
Hemoglobin: 12.3 g/dL (ref 12.0–15.0)
LYMPHS PCT: 36.1 % (ref 12.0–46.0)
Lymphs Abs: 2 10*3/uL (ref 0.7–4.0)
MCHC: 32.4 g/dL (ref 30.0–36.0)
MCV: 80 fl (ref 78.0–100.0)
MONO ABS: 0.5 10*3/uL (ref 0.1–1.0)
Monocytes Relative: 8.6 % (ref 3.0–12.0)
Neutro Abs: 3 10*3/uL (ref 1.4–7.7)
Neutrophils Relative %: 52.7 % (ref 43.0–77.0)
PLATELETS: 285 10*3/uL (ref 150.0–400.0)
RBC: 4.75 Mil/uL (ref 3.87–5.11)
RDW: 14.3 % (ref 11.5–15.5)
WBC: 5.6 10*3/uL (ref 4.0–10.5)

## 2017-12-25 LAB — HEMOGLOBIN A1C: Hgb A1c MFr Bld: 8.4 % — ABNORMAL HIGH (ref 4.6–6.5)

## 2017-12-25 LAB — COMPREHENSIVE METABOLIC PANEL
ALT: 11 U/L (ref 0–35)
AST: 10 U/L (ref 0–37)
Albumin: 4 g/dL (ref 3.5–5.2)
Alkaline Phosphatase: 68 U/L (ref 39–117)
BILIRUBIN TOTAL: 0.3 mg/dL (ref 0.2–1.2)
BUN: 10 mg/dL (ref 6–23)
CALCIUM: 9.3 mg/dL (ref 8.4–10.5)
CO2: 26 meq/L (ref 19–32)
CREATININE: 0.72 mg/dL (ref 0.40–1.20)
Chloride: 99 mEq/L (ref 96–112)
GFR: 109.75 mL/min (ref >60.00)
Glucose, Bld: 154 mg/dL — ABNORMAL HIGH (ref 70–99)
Potassium: 4.3 mEq/L (ref 3.5–5.1)
SODIUM: 131 meq/L — AB (ref 135–145)
Total Protein: 7.6 g/dL (ref 6.0–8.3)

## 2017-12-25 LAB — LIPID PANEL
CHOL/HDL RATIO: 4
CHOLESTEROL: 206 mg/dL — AB (ref 0–200)
HDL: 55.2 mg/dL (ref >39.00)
LDL CALC: 129 mg/dL — AB (ref 0–99)
NONHDL: 151.03
Triglycerides: 110 mg/dL (ref 0.0–149.0)
VLDL: 22 mg/dL (ref 0.0–40.0)

## 2017-12-25 LAB — VITAMIN D 25 HYDROXY (VIT D DEFICIENCY, FRACTURES): VITD: 12.37 ng/mL — AB (ref 30.00–100.00)

## 2017-12-25 LAB — TSH: TSH: 2.14 u[IU]/mL (ref 0.35–4.50)

## 2017-12-25 NOTE — Assessment & Plan Note (Signed)
Suspect controlled. Pending a1c 

## 2017-12-25 NOTE — Progress Notes (Signed)
Subjective:    Patient ID: Cassandra [name], female    DOB: 1965-11-30, 52 y.o.   MRN: [id number]  CC: [name] is a 52 y.o. female who presents today for physical exam.    HPI: Doing well today.   Her only new concern is a New skin lesion between breast, and right lower leg. Non itching nor bleeding. No drainage.   05/2017 [place]- CM[facility]- prescribed norethindrone for heavy bleeding. Menses are twice per month, last 4-5 days. Quarter size clots.  No syncope. Some dizziness. Cramping. Hasnt picked up birth control.    HTN- compliant with medications. Denies exertional chest pain or pressure, numbness or tingling radiating to left arm or jaw, palpitations, dizziness, frequent headaches, changes in vision, or shortness of breath.   DM- off trulicity for a couple of months due to insurance, back on for one month. Has been on metformin.         Colorectal Cancer Screening: UTD , 2015. Normal per patient. Unable to see report. Repeat in 10 years per patient.  Breast Cancer Screening: Mammogram UTD Cervical Cancer Screening: UTD 05/2017. Negative HPV, maligancy Bone Health screening/DEXA for 65+: No increased fracture risk. Defer screening at this time Lung Cancer Screening: Doesn't have 30 year pack year history and age > 55 years. Immunizations       Tetanus - UTD        Pneumococcal - Candidate for.  Labs: Screening labs today. Exercise: Gets regular exercise.  Alcohol use: None Smoking/tobacco use: Nonsmoker.  Wears seat belt: Yes.  HISTORY:  Past Medical History:  Diagnosis Date  . Chicken pox   . Diabetes mellitus without complication (HCC)   . Herpes   . Hypertension     Past Surgical History:  Procedure Laterality Date  . CHOLECYSTECTOMY  2015  . HERNIA REPAIR     52 years old  . TUBAL LIGATION  1991   Family History  Problem Relation Age of Onset  . Hypertension Mother   . Diabetes Mother   . Breast cancer Neg Hx       ALLERGIES:  Glipizide  Current Outpatient Medications on File Prior to Visit  Medication Sig Dispense Refill  . Dulaglutide (TRULICITY) 0.75 MG/0.5ML SOPN Inject 0.75 mg into the skin once a week. 1 pen 2  . lisinopril-hydrochlorothiazide (PRINZIDE,ZESTORETIC) 10-12.5 MG tablet TAKE 1 TABLET BY MOUTH ONCE DAILY 90 tablet 1  . metFORMIN (GLUCOPHAGE) 1000 MG tablet Take 1 tablet (1,000 mg total) by mouth 2 (two) times daily. 180 tablet 0  . valACYclovir (VALTREX) 500 MG tablet Take 1 tablet twice daily by mouth for 3 days at first sign of outbreak 30 tablet 0  . norethindrone (MICRONOR,CAMILA,ERRIN) 0.35 MG tablet Take 1 tablet (0.35 mg total) by mouth daily. (Patient not taking: Reported on 12/25/2017) 1 Package 11   No current facility-administered medications on file prior to visit.     Social History   Tobacco Use  . Smoking status: Never Smoker  . Smokeless tobacco: Never Used  Substance Use Topics  . Alcohol use: No    Alcohol/week: 0.0 standard drinks  . Drug use: No    Review of Systems  Constitutional: Negative for chills, fever and unexpected weight change.  HENT: Negative for congestion.   Respiratory: Negative for cough.   Cardiovascular: Negative for chest pain, palpitations and leg swelling.  Gastrointestinal: Negative for nausea and vomiting.  Genitourinary: Positive for vaginal bleeding. Negative for difficulty urinating.  Musculoskeletal: Negative for arthralgias  and myalgias.  Skin: Positive for color change. Negative for rash and wound.  Neurological: Negative for headaches.  Hematological: Negative for adenopathy.  Psychiatric/Behavioral: Negative for confusion.      Objective:    BP 114/64 (BP Location: Left Arm, Patient Position: Sitting, Cuff Size: Large)   Pulse 91   Temp 98.6 F (37 C) (Oral)   Resp 16   Ht 5' 6.5" (1.689 m)   Wt 208 lb 2 oz (94.4 kg)   LMP 12/13/2017   SpO2 98%   BMI 33.09 kg/m   BP Readings from Last 3 Encounters:  12/25/17 114/64   06/03/17 120/88  01/03/17 110/60   Wt Readings from Last 3 Encounters:  12/25/17 208 lb 2 oz (94.4 kg)  06/03/17 212 lb (96.2 kg)  01/03/17 210 lb 9.6 oz (95.5 kg)    Physical Exam  Constitutional: She appears well-developed and well-nourished.  Eyes: Conjunctivae are normal.  Neck: No thyroid mass and no thyromegaly present.  Cardiovascular: Normal rate, regular rhythm, normal heart sounds and normal pulses.  Pulmonary/Chest: Effort normal and breath sounds normal. She has no wheezes. She has no rhonchi. She has no rales. Right breast exhibits no inverted nipple, no mass, no nipple discharge, no skin change and no tenderness. Left breast exhibits no inverted nipple, no mass, no nipple discharge, no skin change and no tenderness. Breasts are symmetrical.  CBE performed.  Hyperpigmented papule noted between breast    Lymphadenopathy:       Head (right side): No submental, no submandibular, no tonsillar, no preauricular, no posterior auricular and no occipital adenopathy present.       Head (left side): No submental, no submandibular, no tonsillar, no preauricular, no posterior auricular and no occipital adenopathy present.    She has no cervical adenopathy.       Right cervical: No superficial cervical, no deep cervical and no posterior cervical adenopathy present.      Left cervical: No superficial cervical, no deep cervical and no posterior cervical adenopathy present.    She has no axillary adenopathy.  Neurological: She is alert.  Skin: Skin is warm and dry.  Psychiatric: She has a normal mood and affect. Her speech is normal and behavior is normal. Thought content normal.  Vitals reviewed.      Assessment & Plan:   Problem List Items Addressed This Visit      Cardiovascular and Mediastinum   Benign essential HTN    Controlled. Continue regimen        Endocrine   Diabetes mellitus type 2, controlled, without complications (HCC)    Suspect controlled. Pending a1c         Other   Routine physical examination - Primary    CBE performed. Declines pelvic exam as follows with GYN. Pending dermatology consult due to new skin lesions.       Relevant Orders   CBC with Differential/Platelet   Comprehensive metabolic panel   Hemoglobin A1c   Lipid panel   TSH   VITAMIN D 25 Hydroxy (Vit-D Deficiency, Fractures)   Ambulatory referral to Dermatology   Menorrhagia    Continued. Has seen GYN for this. Strongly advised her to make a f/u appointment to discuss further evaluation ( biopsy)  Or treatment ( ablation). Patient verbalized understanding of this. Will follow       Other Visit Diagnoses    Need for pneumococcal vaccination       Relevant Orders   Pneumococcal polysaccharide vaccine 23-valent greater than  or equal to 2yo subcutaneous/IM (Completed)       I am having Verlaine D. Killings maintain her valACYclovir, norethindrone, lisinopril-hydrochlorothiazide, Dulaglutide, and metFORMIN.   No orders of the defined types were placed in this encounter.   Return precautions given.   Risks, benefits, and alternatives of the medications and treatment plan prescribed today were discussed, and patient expressed understanding.   Education regarding symptom management and diagnosis given to patient on AVS.   Continue to follow with Allegra Grana, FNP for routine health maintenance.   Keiasia D Gieger and I agreed with plan.   Rennie Plowman, FNP

## 2017-12-25 NOTE — Assessment & Plan Note (Signed)
Continued. Has seen GYN for this. Strongly advised her to make a f/u appointment to discuss further evaluation ( biopsy)  Or treatment ( ablation). Patient verbalized understanding of this. Will follow

## 2017-12-25 NOTE — Assessment & Plan Note (Signed)
Controlled. Continue regimen 

## 2017-12-25 NOTE — Assessment & Plan Note (Signed)
CBE performed. Declines pelvic exam as follows with GYN. Pending dermatology consult due to new skin lesions.

## 2017-12-25 NOTE — Patient Instructions (Addendum)
Please call [place] office [facility] and a make follow up appointment to discuss cycles and heavy bleeding. I am uncomfortable with waiting a year as sometimes GYN will perform biopsies, ablation.   You may start the birth control she prescribed if you would like however most important is for you to see her.   Today we discussed referrals, orders. dermatolgy   I have placed these orders in the system for you.  Please be sure to give [place] a call if you have not heard from our office regarding scheduling a test or regarding referral in a timely manner.  It is very important that you let me know as soon as possible.    Health Maintenance, Female Adopting a healthy lifestyle and getting preventive care can go a long way to promote health and wellness. Talk with your health care provider about what schedule of regular examinations is right for you. This is a good chance for you to check in with your provider about disease prevention and staying healthy. In between checkups, there are plenty of things you can do on your own. Experts have done a lot of research about which lifestyle changes and preventive measures are most likely to keep you healthy. Ask your health care provider for more information. Weight and diet Eat a healthy diet  Be sure to include plenty of vegetables, fruits, low-fat dairy products, and lean protein.  Do not eat a lot of foods high in solid fats, added sugars, or salt.  Get regular exercise. This is one of the most important things you can do for your health. ? Most adults should exercise for at least 150 minutes each week. The exercise should increase your heart rate and make you sweat (moderate-intensity exercise). ? Most adults should also do strengthening exercises at least twice a week. This is in addition to the moderate-intensity exercise.  Maintain a healthy weight  Body mass index (BMI) is a measurement that can be used to identify possible weight problems. It  estimates body fat based on height and weight. Your health care provider can help determine your BMI and help you achieve or maintain a healthy weight.  For females 55 years of age and older: ? A BMI below 18.5 is considered underweight. ? A BMI of 18.5 to 24.9 is normal. ? A BMI of 25 to 29.9 is considered overweight. ? A BMI of 30 and above is considered obese.  Watch levels of cholesterol and blood lipids  You should start having your blood tested for lipids and cholesterol at 51 years of age, then have this test every 5 years.  You may need to have your cholesterol levels checked more often if: ? Your lipid or cholesterol levels are high. ? You are older than 51 years of age. ? You are at high risk for heart disease.  Cancer screening Lung Cancer  Lung cancer screening is recommended for adults 54-17 years old who are at high risk for lung cancer because of a history of smoking.  A yearly low-dose CT scan of the lungs is recommended for people who: ? Currently smoke. ? Have quit within the past 15 years. ? Have at least a 30-pack-year history of smoking. A pack year is smoking an average of one pack of cigarettes a day for 1 year.  Yearly screening should continue until it has been 15 years since you quit.  Yearly screening should stop if you develop a health problem that would prevent you from having lung  cancer treatment.  Breast Cancer  Practice breast self-awareness. This means understanding how your breasts normally appear and feel.  It also means doing regular breast self-exams. Let your health care provider know about any changes, no matter how small.  If you are in your 20s or 30s, you should have a clinical breast exam (CBE) by a health care provider every 1-3 years as part of a regular health exam.  If you are 88 or older, have a CBE every year. Also consider having a breast X-ray (mammogram) every year.  If you have a family history of breast cancer, talk to  your health care provider about genetic screening.  If you are at high risk for breast cancer, talk to your health care provider about having an MRI and a mammogram every year.  Breast cancer gene (BRCA) assessment is recommended for women who have family members with BRCA-related cancers. BRCA-related cancers include: ? Breast. ? Ovarian. ? Tubal. ? Peritoneal cancers.  Results of the assessment will determine the need for genetic counseling and BRCA1 and BRCA2 testing.  Cervical Cancer Your health care provider may recommend that you be screened regularly for cancer of the pelvic organs (ovaries, uterus, and vagina). This screening involves a pelvic examination, including checking for microscopic changes to the surface of your cervix (Pap test). You may be encouraged to have this screening done every 3 years, beginning at age 74.  For women ages 13-65, health care providers may recommend pelvic exams and Pap testing every 3 years, or they may recommend the Pap and pelvic exam, combined with testing for human papilloma virus (HPV), every 5 years. Some types of HPV increase your risk of cervical cancer. Testing for HPV may also be done on women of any age with unclear Pap test results.  Other health care providers may not recommend any screening for nonpregnant women who are considered low risk for pelvic cancer and who do not have symptoms. Ask your health care provider if a screening pelvic exam is right for you.  If you have had past treatment for cervical cancer or a condition that could lead to cancer, you need Pap tests and screening for cancer for at least 20 years after your treatment. If Pap tests have been discontinued, your risk factors (such as having a new sexual partner) need to be reassessed to determine if screening should resume. Some women have medical problems that increase the chance of getting cervical cancer. In these cases, your health care provider may recommend more  frequent screening and Pap tests.  Colorectal Cancer  This type of cancer can be detected and often prevented.  Routine colorectal cancer screening usually begins at 51 years of age and continues through 51 years of age.  Your health care provider may recommend screening at an earlier age if you have risk factors for colon cancer.  Your health care provider may also recommend using home test kits to check for hidden blood in the stool.  A small camera at the end of a tube can be used to examine your colon directly (sigmoidoscopy or colonoscopy). This is done to check for the earliest forms of colorectal cancer.  Routine screening usually begins at age 88.  Direct examination of the colon should be repeated every 5-10 years through 51 years of age. However, you may need to be screened more often if early forms of precancerous polyps or small growths are found.  Skin Cancer  Check your skin from head to toe  regularly.  Tell your health care provider about any new moles or changes in moles, especially if there is a change in a mole's shape or color.  Also tell your health care provider if you have a mole that is larger than the size of a pencil eraser.  Always use sunscreen. Apply sunscreen liberally and repeatedly throughout the day.  Protect yourself by wearing long sleeves, pants, a wide-brimmed hat, and sunglasses whenever you are outside.  Heart disease, diabetes, and high blood pressure  High blood pressure causes heart disease and increases the risk of stroke. High blood pressure is more likely to develop in: ? People who have blood pressure in the high end of the normal range (130-139/85-89 mm Hg). ? People who are overweight or obese. ? People who are African American.  If you are 25-47 years of age, have your blood pressure checked every 3-5 years. If you are 4 years of age or older, have your blood pressure checked every year. You should have your blood pressure measured  twice-once when you are at a hospital or clinic, and once when you are not at a hospital or clinic. Record the average of the two measurements. To check your blood pressure when you are not at a hospital or clinic, you can use: ? An automated blood pressure machine at a pharmacy. ? A home blood pressure monitor.  If you are between 1 years and 1 years old, ask your health care provider if you should take aspirin to prevent strokes.  Have regular diabetes screenings. This involves taking a blood sample to check your fasting blood sugar level. ? If you are at a normal weight and have a low risk for diabetes, have this test once every three years after 51 years of age. ? If you are overweight and have a high risk for diabetes, consider being tested at a younger age or more often. Preventing infection Hepatitis B  If you have a higher risk for hepatitis B, you should be screened for this virus. You are considered at high risk for hepatitis B if: ? You were born in a country where hepatitis B is common. Ask your health care provider which countries are considered high risk. ? Your parents were born in a high-risk country, and you have not been immunized against hepatitis B (hepatitis B vaccine). ? You have HIV or AIDS. ? You use needles to inject street drugs. ? You live with someone who has hepatitis B. ? You have had sex with someone who has hepatitis B. ? You get hemodialysis treatment. ? You take certain medicines for conditions, including cancer, organ transplantation, and autoimmune conditions.  Hepatitis C  Blood testing is recommended for: ? Everyone born from [birth year] through 1964. ? Anyone with known risk factors for hepatitis C.  Sexually transmitted infections (STIs)  You should be screened for sexually transmitted infections (STIs) including gonorrhea and chlamydia if: ? You are sexually active and are younger than 51 years of age. ? You are older than 51 years of age and your  health care provider tells you that you are at risk for this type of infection. ? Your sexual activity has changed since you were last screened and you are at an increased risk for chlamydia or gonorrhea. Ask your health care provider if you are at risk.  If you do not have HIV, but are at risk, it may be recommended that you take a prescription medicine daily to prevent HIV infection. This  is called pre-exposure prophylaxis (PrEP). You are considered at risk if: ? You are sexually active and do not regularly use condoms or know the HIV status of your partner(s). ? You take drugs by injection. ? You are sexually active with a partner who has HIV.  Talk with your health care provider about whether you are at high risk of being infected with HIV. If you choose to begin PrEP, you should first be tested for HIV. You should then be tested every 3 months for as long as you are taking PrEP. Pregnancy  If you are premenopausal and you may become pregnant, ask your health care provider about preconception counseling.  If you may become pregnant, take 400 to 800 micrograms (mcg) of folic acid every day.  If you want to prevent pregnancy, talk to your health care provider about birth control (contraception). Osteoporosis and menopause  Osteoporosis is a disease in which the bones lose minerals and strength with aging. This can result in serious bone fractures. Your risk for osteoporosis can be identified using a bone density scan.  If you are 69 years of age or older, or if you are at risk for osteoporosis and fractures, ask your health care provider if you should be screened.  Ask your health care provider whether you should take a calcium or vitamin D supplement to lower your risk for osteoporosis.  Menopause may have certain physical symptoms and risks.  Hormone replacement therapy may reduce some of these symptoms and risks. Talk to your health care provider about whether hormone replacement  therapy is right for you. Follow these instructions at home:  Schedule regular health, dental, and eye exams.  Stay current with your immunizations.  Do not use any tobacco products including cigarettes, chewing tobacco, or electronic cigarettes.  If you are pregnant, do not drink alcohol.  If you are breastfeeding, limit how much and how often you drink alcohol.  Limit alcohol intake to no more than 1 drink per day for nonpregnant women. One drink equals 12 ounces of beer, 5 ounces of wine, or 1 ounces of hard liquor.  Do not use street drugs.  Do not share needles.  Ask your health care provider for help if you need support or information about quitting drugs.  Tell your health care provider if you often feel depressed.  Tell your health care provider if you have ever been abused or do not feel safe at home. This information is not intended to replace advice given to you by your health care provider. Make sure you discuss any questions you have with your health care provider. Document Released: 10/16/2010 Document Revised: 09/08/2015 Document Reviewed: 01/04/2015 Elsevier Interactive Patient Education  [employer].

## 2017-12-27 ENCOUNTER — Other Ambulatory Visit: Payer: Self-pay | Admitting: Family

## 2017-12-27 ENCOUNTER — Telehealth: Payer: Self-pay | Admitting: Family

## 2017-12-27 DIAGNOSIS — E871 Hypo-osmolality and hyponatremia: Secondary | ICD-10-CM

## 2017-12-27 NOTE — Telephone Encounter (Signed)
[facility] and spoke with Olegario MessierKathy the Pharmacist, communityflow coordinator.   I let her know that the pt could not come back in today to get her sodium level redrawn.    The pt said she could come by there Monday or Tuesday but not today.  I also let her know about scheduling her to see Catie the pharmacist, I wasn't able to do that from my end.   Olegario MessierKathy said she would call and schedule that.

## 2017-12-31 ENCOUNTER — Other Ambulatory Visit (INDEPENDENT_AMBULATORY_CARE_PROVIDER_SITE_OTHER): Payer: 59

## 2017-12-31 DIAGNOSIS — E871 Hypo-osmolality and hyponatremia: Secondary | ICD-10-CM

## 2017-12-31 LAB — BASIC METABOLIC PANEL
BUN: 9 mg/dL (ref 6–23)
CHLORIDE: 99 meq/L (ref 96–112)
CO2: 22 mEq/L (ref 19–32)
CREATININE: 0.73 mg/dL (ref 0.40–1.20)
Calcium: 9.3 mg/dL (ref 8.4–10.5)
GFR: 108.01 mL/min (ref >60.00)
Glucose, Bld: 187 mg/dL — ABNORMAL HIGH (ref 70–99)
Potassium: 3.9 mEq/L (ref 3.5–5.1)
Sodium: 133 mEq/L — ABNORMAL LOW (ref 135–145)

## 2018-01-01 ENCOUNTER — Other Ambulatory Visit: Payer: Self-pay | Admitting: Family

## 2018-01-01 MED ORDER — LISINOPRIL 10 MG PO TABS: 10.0000 mg | ORAL_TABLET | Freq: Every day | ORAL | 3 refills | Status: DC

## 2018-01-13 ENCOUNTER — Ambulatory Visit (INDEPENDENT_AMBULATORY_CARE_PROVIDER_SITE_OTHER): Payer: 59 | Admitting: Pharmacist

## 2018-01-13 ENCOUNTER — Encounter: Payer: Self-pay | Admitting: Pharmacist

## 2018-01-13 VITALS — BP 119/80 | HR 76 | Wt 212.8 lb

## 2018-01-13 DIAGNOSIS — I10 Essential (primary) hypertension: Secondary | ICD-10-CM | POA: Diagnosis not present

## 2018-01-13 DIAGNOSIS — E119 Type 2 diabetes mellitus without complications: Secondary | ICD-10-CM

## 2018-01-13 MED ORDER — DULAGLUTIDE 0.75 MG/0.5ML ~~LOC~~ SOAJ: 1.5000 mg | SUBCUTANEOUS | 2 refills | Status: DC

## 2018-01-13 MED ORDER — ATORVASTATIN CALCIUM 20 MG PO TABS: 20.0000 mg | ORAL_TABLET | Freq: Every day | ORAL | 3 refills | Status: DC

## 2018-01-13 NOTE — Patient Instructions (Addendum)
It was great to meet you today!  We are not going to make any changes with your diabetes medications - continue metformin 1000 mg twice daily and increase Trulicity to 1.5 mg daily as you and Claris Che discussed.   At last visit, Claris Che also recommended you STOP lisinopril-HCTZ and start lisinopril 10 mg daily by itself. This medication should be ready for you at the pharmacy.   We are going to start atorvastatin 20 mg daily for your cholesterol, and to help reduce your risk of heart disease.   Between now and the next appointment, focus on decreasing your soda consumption to no more than 1 serving daily. I've attached some information about lower-carbohydrate foods.   Schedule follow up with me in 4-5 weeks. Feel free to reach out with any questions.   Best,  Catie Feliz Beam, PharmD

## 2018-01-13 NOTE — Progress Notes (Addendum)
S:     Chief Complaint  Patient presents with  . Medication Management    Diabetes    Patient arrives in good spirits, ambulating without assistance.  Presents for diabetes evaluation, education, and management at the request of Rennie Plowman NP/Teresa Darrick Huntsman MD on last A1c check on 12/25/2017. Last seen by primary care provider on 12/25/2017- at that time, she was instructed to increase Trulicity to 1.5 mg weekly.  Today, she notes that she has not yet picked up increased Trulicity strength or lisinopril 10 mg daily from the pharmacy, but plans to do so on Thursday. She indicates that she knows her soda intake and high carbohydrate diet is the biggest culprit for her recent A1c increase.  Patient reports Diabetes was diagnosed ~2010.  Family/Social History: Son has T1DM (dx at age 81); mother T2DM x the past 7 years;   Insurance coverage/medication affordability: [facility]  Patient reports adherence with medications. She had been off Trulicity for ~1.5 months previously due to not having prescription insurance, but has been back on for ~1 month now. Current diabetes medications include: metformin 1000 mg BID, Trulicity 1.5 mg weekly Current hypertension medications include: lisinopril-HCTZ 10-25 mg (has not started lisinopril 10 mg daily yet)  Patient reports hypoglycemic s/sx including hunger; dizziness, shakiness, sweating. Patient reports hyperglycemic symptoms including polydipsia, polyphagia, nocturia, neuropathy, blurred vision. Patient reports self foot exams.   Patient reported dietary habits: Eats 2 meals/day Lunch: Eats from [employer] (Stir fry, pasta bar); but today had sliced [place], potatoes, gravy Dinner: Rice, potatoes; occasionally cooks cabbage;  Snacks: Potato chips, pretzels  Drinks: 2 -32 oz cups of soda daily;   Patient reported exercise habits: not exercising d/t working 2 jobs; but does have a Research scientist (physical sciences) to gym at [employer]   O:  Physical Exam    Constitutional: She appears well-developed and well-nourished.  Vitals reviewed.   Review of Systems  All other systems reviewed and are negative.   Lab Results  Component Value Date   HGBA1C 8.4 (H) 12/25/2017   Vitals:   01/13/18 1629  BP: 119/80  Pulse: 76    Basic Metabolic Panel BMP Latest Ref Rng & Units 12/31/2017 12/25/2017 01/03/2017  Glucose 70 - 99 mg/dL 409(W) 119(J) 478(G)  BUN 6 - 23 mg/dL 9 10 14   Creatinine 0.40 - 1.20 mg/dL 9.56 2.13 0.86  Sodium 135 - 145 mEq/L 133(L) 131(L) 137  Potassium 3.5 - 5.1 mEq/L 3.9 4.3 3.6  Chloride 96 - 112 mEq/L 99 99 104  CO2 19 - 32 mEq/L 22 26 26   Calcium 8.4 - 10.5 mg/dL 9.3 9.3 9.1    Lipid Panel     Component Value Date/Time   CHOL 206 (H) 12/25/2017 0924   TRIG 110.0 12/25/2017 0924   HDL 55.20 12/25/2017 0924   CHOLHDL 4 12/25/2017 0924   VLDL 22.0 12/25/2017 0924   LDLCALC 129 (H) 12/25/2017 0924    Fasting SMBG: Not checking SMBG at home except once every other week and when feeling poorly  Clinical ASCVD: No  ASCVD risk factors: age 62-75 10 year ASCVD risk: 5.7%   A/P: Following discussion and approval by Dr. Darrick Huntsman, the following medication changes were made:   #Diabetes - Currently uncontrolled, most recent A1c 8.4% on 12/25/2017, improved from 9.1% on 01/03/2017; Goal A1c <7% Patient denies hypoglycemic events. Patient reports adherence with medication. Control is suboptimal due to dietary indiscretions, lack of exercise. - Patient has not increased Trulicity to 1.5 mg dose  yet; anticipates picking up on Thursday - Continue metformin 1000 mg BID - Extensively counseled on diet/lifestyle modifications. Set goal to decrease soda intake to no more than 1 serving/day. Discussed low carbohydrate dietary choices; provided handouts on lower glycemic foods - Next A1C anticipated 03/2018  #Hypertension currently controlled. Goal BP <140/90 Patient reports adherence with medication.  - Patient to pick up  lisinopril 10 mg on Thursday, will substitute for lisinopril-HCTZ; will follow BP at future visits and adjust lisinopril as needed  #ASCVD risk - primary prevention in patient aged 82-75 with DM, baseline LDL 70-189, ASCVD risk <20%. Moderate intensity statin indicated. - Start atorvastatin 20 mg daily - Check lipid panel in 6-8 weeks   Written patient instructions provided.  Total time in face to face counseling 45 minutes.    Follow up in Pharmacist Clinic Visit 4-5.   Lourena Simmonds, PharmD PGY2 Ambulatory Care Pharmacy Resident Phone: [phone]  Agree with plan. Rennie Plowman, NP

## 2018-01-13 NOTE — Assessment & Plan Note (Signed)
#  Hypertension currently controlled. Goal BP <140/90 Patient reports adherence with medication.  - Patient to pick up lisinopril 10 mg on Thursday, will substitute for lisinopril-HCTZ; will follow BP at future visits and adjust lisinopril as needed  #ASCVD risk - primary prevention in patient aged 51-75 with DM, baseline LDL 70-189, ASCVD risk <20%. Moderate intensity statin indicated. - Start atorvastatin 20 mg daily - Check lipid panel in 6-8 weeks

## 2018-01-13 NOTE — Assessment & Plan Note (Signed)
#  Diabetes - Currently uncontrolled, most recent A1c 8.4% on 12/25/2017, improved from 9.1% on 01/03/2017; Goal A1c <7% Patient denies hypoglycemic events. Patient reports adherence with medication. Control is suboptimal due to dietary indiscretions, lack of exercise. - Patient has not increased Trulicity to 1.5 mg dose yet; anticipates picking up on Thursday - Continue metformin 1000 mg BID - Extensively counseled on diet/lifestyle modifications. Set goal to decrease soda intake to no more than 1 serving/day. Discussed low carbohydrate dietary choices; provided handouts on lower glycemic foods - Next A1C anticipated 03/2018

## 2018-01-14 ENCOUNTER — Telehealth: Payer: Self-pay | Admitting: Internal Medicine

## 2018-01-14 NOTE — Telephone Encounter (Signed)
Copied from CRM [id number]. Topic: Quick Communication - Rx Refill/Question >> Jan 14, 2018  8:18 AM Maia Petties wrote: Medication: Dulaglutide (TRULICITY) 0.75 MG/0.5ML SOPN - Sig is for 1.5 - there is a 1.5 pen also so they are wanting to clarify - please call

## 2018-01-14 NOTE — Telephone Encounter (Signed)
Per Margaret's 12/25/17 lab note, patient to increase to the 1.5 mg weekly strength of Trulicity. Please ask [facility] to discontinue the 0.75 mg strength order.  Thanks!  Catie Feliz Beam, PharmD PGY2 Ambulatory Care Pharmacy Resident, [facility] Phone: [phone]

## 2018-01-14 NOTE — Telephone Encounter (Signed)
Called [facility] pharmacy per Vanice Sarah and had Trulicity 0.75mg  discontinued.

## 2018-01-14 NOTE — Telephone Encounter (Signed)
rx clarification

## 2018-01-17 ENCOUNTER — Other Ambulatory Visit: Payer: Self-pay

## 2018-01-17 MED ORDER — DULAGLUTIDE 1.5 MG/0.5ML ~~LOC~~ SOAJ: 1.5000 mg | SUBCUTANEOUS | 1 refills | Status: DC

## 2018-02-10 ENCOUNTER — Ambulatory Visit: Payer: Self-pay | Admitting: Pharmacist

## 2018-03-19 ENCOUNTER — Other Ambulatory Visit: Payer: Self-pay | Admitting: Internal Medicine

## 2018-03-20 ENCOUNTER — Telehealth: Payer: Self-pay | Admitting: Family

## 2018-03-20 NOTE — Telephone Encounter (Signed)
[facility]

## 2018-03-20 NOTE — Telephone Encounter (Signed)
Pt dropped a form that needs to be complete for her new job. Paper work is up front in [employer] folder.

## 2018-03-24 ENCOUNTER — Telehealth: Payer: Self-pay | Admitting: Family

## 2018-03-24 NOTE — Telephone Encounter (Signed)
LMTCB for patient to call back to schedule DM f/u ASAP. If she cannot make f/u in timely manor then schedule nurse visit for tdap & PPD.

## 2018-03-24 NOTE — Telephone Encounter (Signed)
Call pt She is due for f/u for dm and a1c We can complete work form then and administer tdap

## 2018-05-05 ENCOUNTER — Other Ambulatory Visit: Payer: Self-pay

## 2018-05-05 ENCOUNTER — Ambulatory Visit: Payer: 59 | Admitting: Family

## 2018-05-05 ENCOUNTER — Encounter: Payer: Self-pay | Admitting: Family

## 2018-05-05 VITALS — BP 118/64 | HR 89 | Temp 98.8°F | Wt 215.4 lb

## 2018-05-05 DIAGNOSIS — E785 Hyperlipidemia, unspecified: Secondary | ICD-10-CM

## 2018-05-05 DIAGNOSIS — I10 Essential (primary) hypertension: Secondary | ICD-10-CM | POA: Diagnosis not present

## 2018-05-05 DIAGNOSIS — E119 Type 2 diabetes mellitus without complications: Secondary | ICD-10-CM

## 2018-05-05 LAB — COMPREHENSIVE METABOLIC PANEL
ALT: 12 U/L (ref 0–35)
AST: 15 U/L (ref 0–37)
Albumin: 3.9 g/dL (ref 3.5–5.2)
Alkaline Phosphatase: 79 U/L (ref 39–117)
BUN: 10 mg/dL (ref 6–23)
CHLORIDE: 103 meq/L (ref 96–112)
CO2: 24 mEq/L (ref 19–32)
CREATININE: 0.74 mg/dL (ref 0.40–1.20)
Calcium: 9.1 mg/dL (ref 8.4–10.5)
GFR: 99.91 mL/min (ref >60.00)
Glucose, Bld: 231 mg/dL — ABNORMAL HIGH (ref 70–99)
Potassium: 4.1 mEq/L (ref 3.5–5.1)
Sodium: 135 mEq/L (ref 135–145)
Total Bilirubin: 0.5 mg/dL (ref 0.2–1.2)
Total Protein: 7.3 g/dL (ref 6.0–8.3)

## 2018-05-05 LAB — LIPID PANEL
Cholesterol: 143 mg/dL (ref 0–200)
HDL: 51.8 mg/dL (ref >39.00)
LDL Cholesterol: 72 mg/dL (ref 0–99)
NonHDL: 91.2
Total CHOL/HDL Ratio: 3
Triglycerides: 95 mg/dL (ref 0.0–149.0)
VLDL: 19 mg/dL (ref 0.0–40.0)

## 2018-05-05 LAB — HEMOGLOBIN A1C: Hgb A1c MFr Bld: 10.2 % — ABNORMAL HIGH (ref 4.6–6.5)

## 2018-05-05 MED ORDER — ATORVASTATIN CALCIUM 20 MG PO TABS: 20.0000 mg | ORAL_TABLET | Freq: Every day | ORAL | 3 refills | Status: DC

## 2018-05-05 MED ORDER — METFORMIN HCL 1000 MG PO TABS: 1000.0000 mg | ORAL_TABLET | Freq: Two times a day (BID) | ORAL | 0 refills | Status: DC

## 2018-05-05 MED ORDER — LISINOPRIL 10 MG PO TABS: 10.0000 mg | ORAL_TABLET | Freq: Every day | ORAL | 3 refills | Status: DC

## 2018-05-05 NOTE — Assessment & Plan Note (Addendum)
Suspect uncontrolled, patient will restart Trulicity.  She has been on metformin.  Strongly advised the importance of having eye exam.  Close follow-up in 3 months

## 2018-05-05 NOTE — Assessment & Plan Note (Signed)
Continue regimen 

## 2018-05-05 NOTE — Assessment & Plan Note (Signed)
Well controlled, continue lisinopril.

## 2018-05-05 NOTE — Patient Instructions (Addendum)
Nice to see you!  Please make eye exam.   Please please avoid Pepsi!

## 2018-05-05 NOTE — Progress Notes (Signed)
Subjective:    Patient ID: Cassandra [name], female    DOB: 09-09-1965, 53 y.o.   MRN: [id number]  CC: Cassandra [name] is a 53 y.o. female who presents today for follow up.   HPI: Feels well today. No new concerns.  Starting new position with [employer].   lesion on breast has resolved, right leg is unchanged and has been there for 20 years choose not to see dermatology. Lesions are not bleeding or itching.   HLD- Started on Lipitor  DM- hasnt been on ,trulicity 1.5 mg dose as ran out for one month. Job gives free soft drinks. Has been on  Metformin. Thinks sugars are high. FBG 230-240. Little blurry vision, tingling in toes. . No vision loss, HA.   HTN- doing well on current regimen, Lisinopril. No Cp. At home, 120/70s when checks.   Following with GYN. No more vaginal bleeding. No hot flashes.      HISTORY:  Past Medical History:  Diagnosis Date  . Chicken pox   . Diabetes mellitus without complication (HCC)   . Herpes   . Hypertension    Past Surgical History:  Procedure Laterality Date  . CHOLECYSTECTOMY  2015  . HERNIA REPAIR     53 years old  . TUBAL LIGATION  1991   Family History  Problem Relation Age of Onset  . Hypertension Mother   . Diabetes Mother   . Breast cancer Neg Hx     Allergies: Glipizide Current Outpatient Medications on File Prior to Visit  Medication Sig Dispense Refill  . Dulaglutide (TRULICITY) 0.75 MG/0.5ML SOPN Inject 1.5 mg into the skin once a week. 1 pen 2  . TRULICITY 1.5 MG/0.5ML SOPN INJECT 1.5 MG INTO THE SKIN ONCE A WEEK. 2 mL 1  . valACYclovir (VALTREX) 500 MG tablet Take 1 tablet twice daily by mouth for 3 days at first sign of outbreak 30 tablet 0   No current facility-administered medications on file prior to visit.     Social History   Tobacco Use  . Smoking status: Never Smoker  . Smokeless tobacco: Never Used  Substance Use Topics  . Alcohol use: No    Alcohol/week: 0.0 standard drinks  . Drug use: No    Review  of Systems  Constitutional: Negative for chills and fever.  Eyes: Positive for visual disturbance.  Respiratory: Negative for cough.   Cardiovascular: Negative for chest pain and palpitations.  Gastrointestinal: Negative for nausea and vomiting.  Genitourinary: Negative for menstrual problem, vaginal bleeding, vaginal discharge and vaginal pain.  Neurological: Positive for numbness (BL feet). Negative for dizziness and headaches.      Objective:    BP 118/64 (BP Location: Left Arm, Patient Position: Sitting, Cuff Size: Large)   Pulse 89   Temp 98.8 F (37.1 C)   Wt 215 lb 6.4 oz (97.7 kg)   SpO2 98%   BMI 34.25 kg/m  BP Readings from Last 3 Encounters:  05/05/18 118/64  01/13/18 119/80  12/25/17 114/64   Wt Readings from Last 3 Encounters:  05/05/18 215 lb 6.4 oz (97.7 kg)  01/13/18 212 lb 12.8 oz (96.5 kg)  12/25/17 208 lb 2 oz (94.4 kg)    Physical Exam Vitals signs reviewed.  Constitutional:      Appearance: She is well-developed.  Eyes:     Conjunctiva/sclera: Conjunctivae normal.  Cardiovascular:     Rate and Rhythm: Normal rate and regular rhythm.     Pulses: Normal pulses.  Heart sounds: Normal heart sounds.  Pulmonary:     Effort: Pulmonary effort is normal.     Breath sounds: Normal breath sounds. No wheezing, rhonchi or rales.  Skin:    General: Skin is warm and dry.  Neurological:     Mental Status: She is alert.  Psychiatric:        Speech: Speech normal.        Behavior: Behavior normal.        Thought Content: Thought content normal.        Assessment & Plan:   Problem List Items Addressed This Visit      Cardiovascular and Mediastinum   Benign essential HTN - Primary    Well-controlled, continue lisinopril      Relevant Orders   Lipid panel     Endocrine   Diabetes mellitus type 2, controlled, without complications (HCC)    Suspect uncontrolled, patient will restart Trulicity.  She has been on metformin.  Strongly advised the  importance of having eye exam.  Close follow-up in 3 months      Relevant Orders   Hemoglobin A1c   Comprehensive metabolic panel     Other   Hyperlipidemia    Continue regimen      Relevant Orders   Comprehensive metabolic panel   Lipid panel       I have discontinued Cassandra [name] norethindrone. I am also having her maintain her valACYclovir, Dulaglutide, and TRULICITY.   No orders of the defined types were placed in this encounter.   Return precautions given.   Risks, benefits, and alternatives of the medications and treatment plan prescribed today were discussed, and patient expressed understanding.   Education regarding symptom management and diagnosis given to patient on AVS.  Continue to follow with Allegra Grana, FNP for routine health maintenance.   Cassandra [name] and I agreed with plan.   Rennie Plowman, FNP

## 2018-06-27 ENCOUNTER — Other Ambulatory Visit: Payer: Self-pay | Admitting: Family

## 2018-07-02 ENCOUNTER — Telehealth: Payer: Self-pay | Admitting: Family

## 2018-07-02 NOTE — Telephone Encounter (Signed)
Copied from CRM [id number]. Topic: General - Other >> Jul 02, 2018  7:43 AM Tamela Oddi wrote: Reason for CRM: Patient called to request that the nurse or doctor call her regarding pain management for her medication atorvastatin (LIPITOR) 20 MG tablet.  Please advise and call patient back at [phone]

## 2018-07-02 NOTE — Telephone Encounter (Signed)
LMTCB to clarify message for earlier.

## 2018-07-02 NOTE — Telephone Encounter (Signed)
LMTCB

## 2018-07-16 ENCOUNTER — Telehealth: Payer: Self-pay | Admitting: Family Medicine

## 2018-07-16 NOTE — Telephone Encounter (Signed)
LM to call back so I can get patient's email to switch to virtual visit on 4/20.

## 2018-07-16 NOTE — Telephone Encounter (Signed)
Patient has appt planned for 08/04/2018 -- ?change to web ex  Also due for   A1c & possible other fasting labs  Lab Results  Component Value Date   HGBA1C 10.2 (H) 05/05/2018   Opthalmology exam for diabetics.

## 2018-07-24 ENCOUNTER — Telehealth: Payer: Self-pay | Admitting: Family

## 2018-07-24 DIAGNOSIS — E119 Type 2 diabetes mellitus without complications: Secondary | ICD-10-CM

## 2018-07-24 NOTE — Telephone Encounter (Signed)
Pt would like tohave fasting labs done before appt on 08/08/2018. Pt lab is scheduled for 08/04/2018. Order needed please and Thank you!

## 2018-07-31 NOTE — Telephone Encounter (Signed)
Order needed please and Thank you! Pt has appt on 4/20.

## 2018-08-01 NOTE — Telephone Encounter (Signed)
Left detailed message for patient to call back to schedule fasting labs.

## 2018-08-01 NOTE — Telephone Encounter (Signed)
Call patient Please schedule fasting labs

## 2018-08-04 ENCOUNTER — Ambulatory Visit: Payer: Self-pay | Admitting: Family

## 2018-08-04 ENCOUNTER — Other Ambulatory Visit: Payer: Self-pay

## 2018-08-04 ENCOUNTER — Other Ambulatory Visit (INDEPENDENT_AMBULATORY_CARE_PROVIDER_SITE_OTHER): Payer: 59

## 2018-08-04 DIAGNOSIS — E119 Type 2 diabetes mellitus without complications: Secondary | ICD-10-CM

## 2018-08-04 LAB — LIPID PANEL
Cholesterol: 155 mg/dL (ref 0–200)
HDL: 48.2 mg/dL (ref >39.00)
LDL Cholesterol: 90 mg/dL (ref 0–99)
NonHDL: 106.56
Total CHOL/HDL Ratio: 3
Triglycerides: 81 mg/dL (ref 0.0–149.0)
VLDL: 16.2 mg/dL (ref 0.0–40.0)

## 2018-08-04 LAB — COMPREHENSIVE METABOLIC PANEL
ALT: 11 U/L (ref 0–35)
AST: 10 U/L (ref 0–37)
Albumin: 3.8 g/dL (ref 3.5–5.2)
Alkaline Phosphatase: 74 U/L (ref 39–117)
BUN: 15 mg/dL (ref 6–23)
CO2: 25 mEq/L (ref 19–32)
Calcium: 8.7 mg/dL (ref 8.4–10.5)
Chloride: 105 mEq/L (ref 96–112)
Creatinine, Ser: 0.66 mg/dL (ref 0.40–1.20)
GFR: 113.9 mL/min (ref >60.00)
Glucose, Bld: 179 mg/dL — ABNORMAL HIGH (ref 70–99)
Potassium: 3.9 mEq/L (ref 3.5–5.1)
Sodium: 137 mEq/L (ref 135–145)
Total Bilirubin: 0.3 mg/dL (ref 0.2–1.2)
Total Protein: 7.1 g/dL (ref 6.0–8.3)

## 2018-08-04 LAB — HEMOGLOBIN A1C: Hgb A1c MFr Bld: 9.1 % — ABNORMAL HIGH (ref 4.6–6.5)

## 2018-08-06 NOTE — Progress Notes (Signed)
This visit type was conducted due to national recommendations for restrictions regarding the COVID-19 pandemic (e.g. social distancing).  This format is felt to be most appropriate for this patient at this time.  All issues noted in this document were discussed and addressed.  No physical exam was performed (except for noted visual exam findings with Video Visits). Virtual Visit via Video Note  I connected with@  on 08/11/18 at  3:30 PM EDT by a video enabled telemedicine application and verified that I am speaking with the correct person using two identifiers.  Location patient: home Location provider:work  Persons participating in the virtual visit: patient, provider  I discussed the limitations of evaluation and management by telemedicine and the availability of in person appointments. The patient expressed understanding and agreed to proceed.   HPI:   HTN-  At home 121/75. No CP.   HLD - started to have 'muscle aches all the time', started 2 months. Aleve with no relief. on lipitor. No fever.   Menses every month. Heavy flow of menstruation. Seeing large clots the size of quarters. Notes using 5 pads over course of night, 8-10 pads during day. No dizziness ,syncope.   Complains of watery diarrhea for 2 months, unchanged. Non bloody.  Worsened by chocolate, greasy foods. Notes more episodes at night, several hours after meal. Able to make it to the bathroom. Endorses weight gain, bloating. No fever, abdominal pain, dysuria, N, V. 4-5 episodes/ 24 hours. h/o cholestectomy.   No unusual foods, recent antibiotics.  DM- uncontrolled. Compliant with medication. Has been keeping food journal.   ROS: See pertinent positives and negatives per HPI.  Past Medical History:  Diagnosis Date  . Chicken pox   . Diabetes mellitus without complication (HCC)   . Herpes   . Hypertension     Past Surgical History:  Procedure Laterality Date  . CHOLECYSTECTOMY  2015  . HERNIA REPAIR     53  years old  . TUBAL LIGATION  1991    Family History  Problem Relation Age of Onset  . Hypertension Mother   . Diabetes Mother   . Breast cancer Neg Hx     SOCIAL HX: never smoker   Current Outpatient Medications:  .  atorvastatin (LIPITOR) 20 MG tablet, Take 1 tablet (20 mg total) by mouth daily., Disp: 90 tablet, Rfl: 3 .  Dulaglutide (TRULICITY) 0.75 MG/0.5ML SOPN, Inject 1.5 mg into the skin once a week., Disp: 1 pen, Rfl: 2 .  lisinopril (PRINIVIL,ZESTRIL) 10 MG tablet, Take 1 tablet (10 mg total) by mouth daily., Disp: 90 tablet, Rfl: 3 .  valACYclovir (VALTREX) 500 MG tablet, Take 1 tablet twice daily by mouth for 3 days at first sign of outbreak, Disp: 30 tablet, Rfl: 0 .  metFORMIN (GLUMETZA) 1000 MG (MOD) 24 hr tablet, Take 1 tablet (1,000 mg total) by mouth 2 (two) times daily., Disp: 90 tablet, Rfl: 1  EXAM:  VITALS per patient if applicable:  GENERAL: alert, oriented, appears well and in no acute distress  HEENT: atraumatic, conjunttiva clear, no obvious abnormalities on inspection of external nose and ears  NECK: normal movements of the head and neck  LUNGS: on inspection no signs of respiratory distress, breathing rate appears normal, no obvious gross SOB, gasping or wheezing  CV: no obvious cyanosis  MS: moves all visible extremities without noticeable abnormality  PSYCH/NEURO: pleasant and cooperative, no obvious depression or anxiety, speech and thought processing grossly intact  ASSESSMENT AND PLAN:  Discussed the  following assessment and plan:  Diarrhea, unspecified type - Plan: CBC with Differential/Platelet, Comprehensive metabolic panel, Fecal occult blood, imunochemical, Gastrointestinal Pathogen Panel PCR, Stool culture, metFORMIN (GLUMETZA) 1000 MG (MOD) 24 hr tablet  Controlled type 2 diabetes mellitus without complication, without long-term current use of insulin (HCC) - Plan: metFORMIN (GLUMETZA) 1000 MG (MOD) 24 hr tablet  Menorrhagia with  regular cycle  Benign essential HTN  Hyperlipidemia, unspecified hyperlipidemia type Problem List Items Addressed This Visit      Cardiovascular and Mediastinum   Benign essential HTN    Controlled.  Continue current regimen        Endocrine   Diabetes mellitus type 2, controlled, without complications (HCC)    Uncontrolled.  change from metformin to XR to help mitigate diarrhea.  Pending consult with pharmacy;  ? Consideration Jardiance      Relevant Medications   metFORMIN (GLUMETZA) 1000 MG (MOD) 24 hr tablet     Other   Diarrhea - Primary    Etiology is not at this time.  Patient is nontoxic in appearance.  She is afebrile.  Pending stool studies.  Advised trial of Metformin XR to see if this mitigates diarrhea      Relevant Medications   metFORMIN (GLUMETZA) 1000 MG (MOD) 24 hr tablet   Other Relevant Orders   CBC with Differential/Platelet   Comprehensive metabolic panel   Fecal occult blood, imunochemical   Gastrointestinal Pathogen Panel PCR   Stool culture   Menorrhagia with regular cycle    Pending CBC. advised patient she needs to follow-up with established GYN for further evaluation, such as endometrial biopsy.  Patient verbalized understanding and importance of this      Hyperlipidemia    Uncontrolled. Will wait on increase after trial of taking Lipitor once a week to see if this mitigates myalgia.  Patient will let me know how she is doing.            I discussed the assessment and treatment plan with the patient. The patient was provided an opportunity to ask questions and all were answered. The patient agreed with the plan and demonstrated an understanding of the instructions.   The patient was advised to call back or seek an in-person evaluation if the symptoms worsen or if the condition fails to improve as anticipated.   Rennie Plowman, FNP

## 2018-08-08 ENCOUNTER — Ambulatory Visit (INDEPENDENT_AMBULATORY_CARE_PROVIDER_SITE_OTHER): Payer: 59 | Admitting: Family

## 2018-08-08 ENCOUNTER — Telehealth: Payer: Self-pay | Admitting: Family

## 2018-08-08 ENCOUNTER — Encounter: Payer: Self-pay | Admitting: Family

## 2018-08-08 DIAGNOSIS — I10 Essential (primary) hypertension: Secondary | ICD-10-CM

## 2018-08-08 DIAGNOSIS — N920 Excessive and frequent menstruation with regular cycle: Secondary | ICD-10-CM | POA: Diagnosis not present

## 2018-08-08 DIAGNOSIS — E119 Type 2 diabetes mellitus without complications: Secondary | ICD-10-CM

## 2018-08-08 DIAGNOSIS — E785 Hyperlipidemia, unspecified: Secondary | ICD-10-CM

## 2018-08-08 DIAGNOSIS — R197 Diarrhea, unspecified: Secondary | ICD-10-CM

## 2018-08-08 MED ORDER — METFORMIN HCL ER (MOD) 1000 MG PO TB24: 1000.0000 mg | ORAL_TABLET | Freq: Two times a day (BID) | ORAL | 1 refills | Status: DC

## 2018-08-08 NOTE — Assessment & Plan Note (Addendum)
Uncontrolled. Will wait on increase after trial of taking Lipitor once a week to see if this mitigates myalgia.  Patient will let me know how she is doing.

## 2018-08-08 NOTE — Telephone Encounter (Signed)
Copied from CRM [id number]. Topic: Quick Communication - Rx Refill/Question >> Aug 08, 2018  4:15 PM Fanny Bien wrote: Medication:metFORMIN (GLUMETZA) 1000 MG (MOD) 24 hr tablet [[id number]] needs to confirm that this is the correct medication because this is different than the previous Metformin. Please advise

## 2018-08-08 NOTE — Patient Instructions (Addendum)
Trial of lipitor 1 time per week. Do this for acouple week, then may increae 2 twice per week.   I am concerned with heavy bleeding and you may need further evaluation, please call [facility] and make a follow up.   We will work with Catie travis in regard to diabetes and likely adding medication.   Labs and stool culture here next week. We will you call in regard to this; let us know if any questions.   Call and schedule a follow up for one month  Stay safe!

## 2018-08-11 ENCOUNTER — Encounter: Payer: Self-pay | Admitting: Family

## 2018-08-11 NOTE — Assessment & Plan Note (Signed)
Uncontrolled.  change from metformin to XR to help mitigate diarrhea.  Pending consult with pharmacy;  ? Consideration Jardiance

## 2018-08-11 NOTE — Assessment & Plan Note (Signed)
Pending CBC. advised patient she needs to follow-up with established GYN for further evaluation, such as endometrial biopsy.  Patient verbalized understanding and importance of this

## 2018-08-11 NOTE — Assessment & Plan Note (Addendum)
Etiology is not at this time.  Patient is nontoxic in appearance.  She is afebrile.  Pending stool studies.  Advised trial of Metformin XR to see if this mitigates diarrhea

## 2018-08-11 NOTE — Progress Notes (Signed)
LMTCB to make patient lab appointment.

## 2018-08-11 NOTE — Assessment & Plan Note (Signed)
Controlled. Continue current regimen. 

## 2018-08-11 NOTE — Telephone Encounter (Signed)
I have called & clarified with pharmacy that this is correct since patient was experiencing diarrhea on regular metformin.

## 2018-08-12 ENCOUNTER — Other Ambulatory Visit: Payer: Self-pay

## 2018-08-12 ENCOUNTER — Other Ambulatory Visit (INDEPENDENT_AMBULATORY_CARE_PROVIDER_SITE_OTHER): Payer: 59

## 2018-08-12 DIAGNOSIS — R197 Diarrhea, unspecified: Secondary | ICD-10-CM | POA: Diagnosis not present

## 2018-08-13 LAB — COMPREHENSIVE METABOLIC PANEL
ALT: 13 U/L (ref 0–35)
AST: 14 U/L (ref 0–37)
Albumin: 3.9 g/dL (ref 3.5–5.2)
Alkaline Phosphatase: 69 U/L (ref 39–117)
BUN: 10 mg/dL (ref 6–23)
CO2: 28 mEq/L (ref 19–32)
Calcium: 8.9 mg/dL (ref 8.4–10.5)
Chloride: 102 mEq/L (ref 96–112)
Creatinine, Ser: 0.79 mg/dL (ref 0.40–1.20)
GFR: 92.55 mL/min (ref >60.00)
Glucose, Bld: 166 mg/dL — ABNORMAL HIGH (ref 70–99)
Potassium: 4.1 mEq/L (ref 3.5–5.1)
Sodium: 138 mEq/L (ref 135–145)
Total Bilirubin: 0.4 mg/dL (ref 0.2–1.2)
Total Protein: 7.1 g/dL (ref 6.0–8.3)

## 2018-08-13 LAB — CBC WITH DIFFERENTIAL/PLATELET
Basophils Absolute: 0.1 10*3/uL (ref 0.0–0.1)
Basophils Relative: 1.3 % (ref 0.0–3.0)
Eosinophils Absolute: 0.1 10*3/uL (ref 0.0–0.7)
Eosinophils Relative: 1.9 % (ref 0.0–5.0)
HCT: 37.6 % (ref 36.0–46.0)
Hemoglobin: 12.2 g/dL (ref 12.0–15.0)
Lymphocytes Relative: 40.1 % (ref 12.0–46.0)
Lymphs Abs: 2.5 10*3/uL (ref 0.7–4.0)
MCHC: 32.6 g/dL (ref 30.0–36.0)
MCV: 79.3 fl (ref 78.0–100.0)
Monocytes Absolute: 0.4 10*3/uL (ref 0.1–1.0)
Monocytes Relative: 6.5 % (ref 3.0–12.0)
Neutro Abs: 3.1 10*3/uL (ref 1.4–7.7)
Neutrophils Relative %: 50.2 % (ref 43.0–77.0)
Platelets: 302 10*3/uL (ref 150.0–400.0)
RBC: 4.74 Mil/uL (ref 3.87–5.11)
RDW: 14.6 % (ref 11.5–15.5)
WBC: 6.1 10*3/uL (ref 4.0–10.5)

## 2018-08-14 ENCOUNTER — Other Ambulatory Visit: Payer: Self-pay

## 2018-08-14 ENCOUNTER — Other Ambulatory Visit: Payer: 59

## 2018-08-14 DIAGNOSIS — R197 Diarrhea, unspecified: Secondary | ICD-10-CM | POA: Diagnosis not present

## 2018-08-15 NOTE — Progress Notes (Signed)
Patient had labs drawn yesterday 08/14/18.

## 2018-08-18 ENCOUNTER — Telehealth: Payer: Self-pay | Admitting: Pharmacist

## 2018-08-18 ENCOUNTER — Ambulatory Visit: Payer: 59 | Admitting: Pharmacist

## 2018-08-18 MED ORDER — DULAGLUTIDE 0.75 MG/0.5ML ~~LOC~~ SOAJ: 0.7500 mg | SUBCUTANEOUS | 2 refills | Status: DC

## 2018-08-18 NOTE — Telephone Encounter (Signed)
  I have reviewed the above information and agree with above.   Mackie Holness, MD 

## 2018-08-18 NOTE — Telephone Encounter (Signed)
S:     Chief Complaint  Patient presents with  . Medication Management    Diabetes    Patient contacted telephonically for diabetes evaluation, education, and management at the request of Cassandra [name]/Dr. Darrick [name] (re-referred at last appointment on 08/08/2018). At last Pharmacy appointment on 01/14/2019, Trulicity therapy was maximized.   Today, she notes that she has had struggles with diarrhea for the past 1-2 months. She is unsure if the metformin or Trulicity caused or worsened the diarrhea. She went without Trulicity therapy for ~1 month in January, and restarted after appointment with Cassandra PlowmanMargaret [name] on 05/05/2018. She noted that a friend at work recommended that she take the entire 2000 mg dose of metformin at once in the evening. She notes that she has cut back on sodas and snack foods, but does note that she has been eating a lot of nighttime snacks, mainly consisting of fruits (strawberries, blueberries).  Patient reports adherence with medications.  Current diabetes medications include: metformin XR 2000 mg QPM, Trulicity 1.5 mg once weekly Current hyperlipidemic medications include: atorvastatin 20 mg once weekly  Patient denies hypoglycemic s/sx including dizziness, shakiness, sweating.   O:  Lab Results  Component Value Date   HGBA1C 9.1 (H) 08/04/2018    Basic Metabolic Panel BMP Latest Ref Rng & Units 08/12/2018 08/04/2018 05/05/2018  Glucose 70 - 99 mg/dL 161(W166(H) 960(A179(H) 540(J231(H)  BUN 6 - 23 mg/dL 10 15 10   Creatinine 0.40 - 1.20 mg/dL 8.110.79 9.140.66 7.820.74  Sodium 135 - 145 mEq/L 138 137 135  Potassium 3.5 - 5.1 mEq/L 4.1 3.9 4.1  Chloride 96 - 112 mEq/L 102 105 103  CO2 19 - 32 mEq/L 28 25 24   Calcium 8.4 - 10.5 mg/dL 8.9 8.7 9.1   Lipid Panel     Component Value Date/Time   CHOL 155 08/04/2018 0827   TRIG 81.0 08/04/2018 0827   HDL 48.20 08/04/2018 0827   CHOLHDL 3 08/04/2018 0827   VLDL 16.2 08/04/2018 0827   LDLCALC 90 08/04/2018 0827    Self-Monitored Blood  Glucose Results Fasting SMBG: 150s-160s 2 hour post-prandial/random SMBG: 120, 150s  Clinical ASCVD: No  The 10-year ASCVD risk score Denman George(Goff DC Jr., et al., 2013) is: 5.9%   Values used to calculate the score:     Age: [age] years     Sex: Female     Is Non-Hispanic African American: Yes     Diabetic: Yes     Tobacco smoker: No     Systolic Blood Pressure: 118 mmHg     Is BP treated: Yes     HDL Cholesterol: 48.2 mg/dL     Total Cholesterol: 155 mg/dL    A/P: #Diabetes - Currently uncontrolled but much improved per SMBG, most recent A1c 9.1% on 08/04/2018; Goal A1c <7%. It may be that restarting Trulicity 1.5 mg dose, instead of restarting on 0.75 mg, triggered diarrhea. Additionally, taking all 2000 mg of metformin at once may be causing diarrhea.  - Recommend to take metformin XR 1000 mg BID with the two largest meals - Decreasing Trulicity to 0.75 mg once weekly to see if this improves diarrhea - Encouraged to eliminate or reduce portion sizes of late night snacks to help improve AM blood sugars     #ASCVD risk - primary prevention in patient aged 52-75 with DM; moderate intensity statin indicated. Patient noted body aches that were believed to be attributed to atorvastatin therapy, however, would typically expect statin-associated muscle symptoms to develop within a few  weeks of starting statin therapy.  - Continue atorvastatin once weekly. Data exists for three times weekly atorvastatin dosing, so would recommend to increase to this eventually. Could also consider changing to rosuvastatin; more hydrophilic nature may reduce risk of muscle symptoms  Follow up in Pharmacist Clinic Visit 4 weeks.  Lourena Simmonds, PharmD, CPP PGY2 Ambulatory Care Pharmacy Resident Phone: [phone]

## 2018-08-19 LAB — GASTROINTESTINAL PATHOGEN PANEL PCR
C. difficile Tox A/B, PCR: NOT DETECTED
Campylobacter, PCR: NOT DETECTED
Cryptosporidium, PCR: NOT DETECTED
E coli (ETEC) LT/ST PCR: NOT DETECTED
E coli (STEC) stx1/stx2, PCR: NOT DETECTED
E coli 0157, PCR: NOT DETECTED
Giardia lamblia, PCR: NOT DETECTED
Norovirus, PCR: NOT DETECTED
Rotavirus A, PCR: NOT DETECTED
Salmonella, PCR: NOT DETECTED
Shigella, PCR: NOT DETECTED

## 2018-08-19 LAB — STOOL CULTURE
MICRO NUMBER:: 436084
MICRO NUMBER:: 436085
MICRO NUMBER:: 436086
SHIGA RESULT:: NOT DETECTED
SPECIMEN QUALITY:: ADEQUATE
SPECIMEN QUALITY:: ADEQUATE
SPECIMEN QUALITY:: ADEQUATE

## 2018-08-20 NOTE — Telephone Encounter (Signed)
I agree as well  Rennie Plowman, np

## 2018-08-21 ENCOUNTER — Other Ambulatory Visit: Payer: Self-pay | Admitting: Family

## 2018-08-22 ENCOUNTER — Other Ambulatory Visit: Payer: Self-pay

## 2018-09-15 ENCOUNTER — Ambulatory Visit: Payer: 59 | Admitting: Pharmacist

## 2018-09-15 ENCOUNTER — Telehealth: Payer: Self-pay | Admitting: Pharmacist

## 2018-09-15 DIAGNOSIS — E119 Type 2 diabetes mellitus without complications: Secondary | ICD-10-CM

## 2018-09-15 NOTE — Telephone Encounter (Addendum)
S:     Chief Complaint  Patient presents with  . Medication Management    Diabetes    Patient contacted telephonically for diabetes evaluation, education, and management at the request of Claris Che Arnett/Dr. Darrick Huntsman (re-referred at last appointment on 08/08/2018). At last Pharmacy appointment on 08/18/2018, metformin was changed to the XR formulation and Trulicity dose decreased to 0.75 mg weekly to help reduce GI upset.   Patient reports checking blood glucose levels once a day around 2-hours after lunch. She states that levels have been between 150-160. Patient also reports that she is still currently on the 1.5mg  dose of Trulicity and had not decreased the dose since the last pharmacy visit. However, patient states that she is no longer experiencing issues with diarrhea after switching to the metformin XR formulation.   Insurance coverage/medication affordability: [employer]   Patient reports adherence with medications.  Current diabetes medications include: Trulicity 1.5 mg weekly and metformin XR 1000 mg BID  Patient reported dietary habits: Eats 3 meals/day Breakfast: usually just has bacon for breakfast Lunch: [place] sandwiches, [place] sandwiches, kiwis, grapes Dinner: Vegetables, shrimp, lots of rice  Snacks: snacks about 2 times per day - popcorn  Drinks: water, flavored water, has not had soda in 6 days   Patient reported exercise habits: Patient reports that she does not normally exercise but has started using elastic bands on legs for resistance exercising. Patient also reports walking once this past week for an hour but states she is not consistent with this.   O:   Lab Results  Component Value Date   HGBA1C 9.1 (H) 08/04/2018    Basic Metabolic Panel BMP Latest Ref Rng & Units 08/12/2018 08/04/2018 05/05/2018  Glucose 70 - 99 mg/dL 683(M) 196(Q) 229(N)  BUN 6 - 23 mg/dL 10 15 10   Creatinine 0.40 - 1.20 mg/dL 9.89 2.11 9.41  Sodium 135 - 145 mEq/L  138 137 135  Potassium 3.5 - 5.1 mEq/L 4.1 3.9 4.1  Chloride 96 - 112 mEq/L 102 105 103  CO2 19 - 32 mEq/L 28 25 24   Calcium 8.4 - 10.5 mg/dL 8.9 8.7 9.1     Lipid Panel     Component Value Date/Time   CHOL 155 08/04/2018 0827   TRIG 81.0 08/04/2018 0827   HDL 48.20 08/04/2018 0827   CHOLHDL 3 08/04/2018 0827   VLDL 16.2 08/04/2018 0827   LDLCALC 90 08/04/2018 0827    Self-Monitored Blood Glucose Results Patient reports only checking BG levels once a day, 2 hours after meals.  2 hour post-prandial/random SMBG: 150-160  Clinical ASCVD: No  The 10-year ASCVD risk score Denman George DC Jr., et al., 2013) is: 5.9%   Values used to calculate the score:     Age: 53 years     Sex: Female     Is Non-Hispanic African American: Yes     Diabetic: Yes     Tobacco smoker: No     Systolic Blood Pressure: 118 mmHg     Is BP treated: Yes     HDL Cholesterol: 48.2 mg/dL     Total Cholesterol: 155 mg/dL    A/P: #Diabetes - Currently uncontrolled though improved per SMBG results and reinitiation of Trulicity therapy, most recent A1c 9.1% on 08/04/2018; Goal A1c <7% Patient denies hypoglycemic events. Patient reports adherence with medication. Control is suboptimal due to dietary indiscretions and suboptimal physical activity.  - Congratulated patient on positive dietary modifications including cutting soda out of diet. Educated patient  to increase vegetable intake and to reduce starch (rice) intake to help better control her blood glucose levels.  -Instructed patient to try to incorporate exercise into her weekly routines and to try walking 20-30 minutes for about 2-3 days out of the week.  -Instructed patient to increase SMBG to twice daily on some days to include fasting blood glucose levels  -Continue taking Trulicity 1.5 mg once weekly and metformin XR 1000 mg BID -Next A1c due 10/2018  Will collaborate with primary care provider Rennie PlowmanMargaret Arnett to determine plans for follow up.  Patient  seen with Gwynneth AlbrightSara , PGY1 Ambulatory Care Pharmacy Resident   Catie Feliz Beamravis, PharmD, CPP PGY2 Ambulatory Care Pharmacy Resident, [facility] Phone: [phone]

## 2018-09-15 NOTE — Telephone Encounter (Signed)
  I have reviewed the above information and agree with above.   Teresa Tullo, MD 

## 2018-09-19 NOTE — Telephone Encounter (Signed)
LMTCB to make patient f/u with Claris Che for July.

## 2018-09-19 NOTE — Telephone Encounter (Signed)
Thanks Catie. Maralyn Sago, call patient as she need to be scheduled follow-up for July.  She will be due for A1c then as well.  Please arrange for lab.

## 2018-09-19 NOTE — Addendum Note (Signed)
Addended by: Allegra Grana on: 09/19/2018 12:45 PM   Modules accepted: Orders

## 2018-09-22 NOTE — Telephone Encounter (Signed)
Call pt again  See below

## 2018-09-24 NOTE — Telephone Encounter (Signed)
See below  Call again

## 2018-10-13 ENCOUNTER — Other Ambulatory Visit: Payer: Self-pay | Admitting: Family

## 2018-10-17 ENCOUNTER — Telehealth: Payer: Self-pay | Admitting: Family

## 2018-10-17 NOTE — Telephone Encounter (Signed)
Patient is calling regarding the virtual appt on 7/8 The patient does not see that point of the appt. She is working with Barrister's clerk, the pharmist is working with her as well. Please advise [phone]

## 2018-10-20 NOTE — Telephone Encounter (Signed)
Noted Lab scheduled 11/04/18

## 2018-10-20 NOTE — Telephone Encounter (Signed)
Call pt I would advise to keep her appt as her DM is uncontrolled. Last a1c 8 % and we were working on getting close to 6.5%  I advise all patients with diabetes to have f/u every 3 months.

## 2018-10-22 ENCOUNTER — Ambulatory Visit: Payer: 59 | Admitting: Family

## 2018-11-04 ENCOUNTER — Other Ambulatory Visit: Payer: Self-pay

## 2018-11-04 ENCOUNTER — Other Ambulatory Visit (INDEPENDENT_AMBULATORY_CARE_PROVIDER_SITE_OTHER): Payer: 59

## 2018-11-04 DIAGNOSIS — E119 Type 2 diabetes mellitus without complications: Secondary | ICD-10-CM

## 2018-11-05 LAB — COMPREHENSIVE METABOLIC PANEL
ALT: 14 U/L (ref 0–35)
AST: 12 U/L (ref 0–37)
Albumin: 3.8 g/dL (ref 3.5–5.2)
Alkaline Phosphatase: 74 U/L (ref 39–117)
BUN: 13 mg/dL (ref 6–23)
CO2: 28 mEq/L (ref 19–32)
Calcium: 9.3 mg/dL (ref 8.4–10.5)
Chloride: 105 mEq/L (ref 96–112)
Creatinine, Ser: 0.75 mg/dL (ref 0.40–1.20)
GFR: 98.18 mL/min (ref >60.00)
Glucose, Bld: 143 mg/dL — ABNORMAL HIGH (ref 70–99)
Potassium: 4 mEq/L (ref 3.5–5.1)
Sodium: 139 mEq/L (ref 135–145)
Total Bilirubin: 0.4 mg/dL (ref 0.2–1.2)
Total Protein: 6.3 g/dL (ref 6.0–8.3)

## 2018-11-05 LAB — HEMOGLOBIN A1C: Hgb A1c MFr Bld: 8.5 % — ABNORMAL HIGH (ref 4.6–6.5)

## 2018-11-12 ENCOUNTER — Other Ambulatory Visit: Payer: Self-pay | Admitting: Family

## 2018-11-12 DIAGNOSIS — E119 Type 2 diabetes mellitus without complications: Secondary | ICD-10-CM

## 2018-11-12 DIAGNOSIS — R197 Diarrhea, unspecified: Secondary | ICD-10-CM

## 2018-11-13 NOTE — Telephone Encounter (Signed)
Refill request for Metformin ER.  

## 2018-11-14 ENCOUNTER — Other Ambulatory Visit: Payer: Self-pay | Admitting: Family

## 2018-11-14 DIAGNOSIS — E119 Type 2 diabetes mellitus without complications: Secondary | ICD-10-CM

## 2018-11-14 MED ORDER — JARDIANCE 10 MG PO TABS: 10.0000 mg | ORAL_TABLET | Freq: Every morning | ORAL | 1 refills | Status: DC

## 2018-11-14 NOTE — Telephone Encounter (Signed)
Patient advised on below & verbalized understanding.  

## 2018-11-14 NOTE — Telephone Encounter (Signed)
Call pt Advise that I sent in jardiance for her to start I refilled metformin XR however she needs to call her pharmacy and ensure that this batch has not been recalled.

## 2018-12-19 ENCOUNTER — Other Ambulatory Visit: Payer: Self-pay | Admitting: Family

## 2018-12-19 NOTE — Telephone Encounter (Signed)
Pt calling because pharmacy closes in one hour and will not be open on Monday.  Pt wants to know if someone will call this in right now for her.

## 2019-01-16 ENCOUNTER — Other Ambulatory Visit: Payer: Self-pay | Admitting: Family

## 2019-01-16 DIAGNOSIS — R197 Diarrhea, unspecified: Secondary | ICD-10-CM

## 2019-01-16 DIAGNOSIS — E119 Type 2 diabetes mellitus without complications: Secondary | ICD-10-CM

## 2019-02-09 ENCOUNTER — Telehealth: Payer: Self-pay | Admitting: *Deleted

## 2019-02-09 NOTE — Telephone Encounter (Signed)
Please place future orders for lab appt.  

## 2019-02-11 ENCOUNTER — Other Ambulatory Visit (INDEPENDENT_AMBULATORY_CARE_PROVIDER_SITE_OTHER): Payer: 59

## 2019-02-11 ENCOUNTER — Other Ambulatory Visit: Payer: Self-pay

## 2019-02-11 ENCOUNTER — Other Ambulatory Visit: Payer: Self-pay | Admitting: Internal Medicine

## 2019-02-11 DIAGNOSIS — E119 Type 2 diabetes mellitus without complications: Secondary | ICD-10-CM

## 2019-02-11 DIAGNOSIS — I10 Essential (primary) hypertension: Secondary | ICD-10-CM

## 2019-02-11 DIAGNOSIS — E785 Hyperlipidemia, unspecified: Secondary | ICD-10-CM

## 2019-02-11 DIAGNOSIS — Z1329 Encounter for screening for other suspected endocrine disorder: Secondary | ICD-10-CM

## 2019-02-11 DIAGNOSIS — E559 Vitamin D deficiency, unspecified: Secondary | ICD-10-CM

## 2019-02-12 LAB — CBC WITH DIFFERENTIAL/PLATELET
Basophils Absolute: 0.1 10*3/uL (ref 0.0–0.1)
Basophils Relative: 1.4 % (ref 0.0–3.0)
Eosinophils Absolute: 0.1 10*3/uL (ref 0.0–0.7)
Eosinophils Relative: 1.4 % (ref 0.0–5.0)
HCT: 41.6 % (ref 36.0–46.0)
Hemoglobin: 13.1 g/dL (ref 12.0–15.0)
Lymphocytes Relative: 41.9 % (ref 12.0–46.0)
Lymphs Abs: 3.4 10*3/uL (ref 0.7–4.0)
MCHC: 31.4 g/dL (ref 30.0–36.0)
MCV: 80.2 fl (ref 78.0–100.0)
Monocytes Absolute: 0.7 10*3/uL (ref 0.1–1.0)
Monocytes Relative: 8.8 % (ref 3.0–12.0)
Neutro Abs: 3.7 10*3/uL (ref 1.4–7.7)
Neutrophils Relative %: 46.5 % (ref 43.0–77.0)
Platelets: 321 10*3/uL (ref 150.0–400.0)
RBC: 5.19 Mil/uL — ABNORMAL HIGH (ref 3.87–5.11)
RDW: 14.8 % (ref 11.5–15.5)
WBC: 8 10*3/uL (ref 4.0–10.5)

## 2019-02-12 LAB — LIPID PANEL
Cholesterol: 150 mg/dL (ref 0–200)
HDL: 60.5 mg/dL (ref >39.00)
LDL Cholesterol: 74 mg/dL (ref 0–99)
NonHDL: 89.57
Total CHOL/HDL Ratio: 2
Triglycerides: 77 mg/dL (ref 0.0–149.0)
VLDL: 15.4 mg/dL (ref 0.0–40.0)

## 2019-02-12 LAB — URINALYSIS, ROUTINE W REFLEX MICROSCOPIC
Bilirubin Urine: NEGATIVE
Hgb urine dipstick: NEGATIVE
Ketones, ur: NEGATIVE
Leukocytes,Ua: NEGATIVE
Nitrite: NEGATIVE
Protein, ur: NEGATIVE
Specific Gravity, Urine: 1.041 — ABNORMAL HIGH (ref 1.001–1.03)
pH: <=5 (ref 5.0–8.0)

## 2019-02-12 LAB — COMPREHENSIVE METABOLIC PANEL
ALT: 14 U/L (ref 0–35)
AST: 15 U/L (ref 0–37)
Albumin: 4.5 g/dL (ref 3.5–5.2)
Alkaline Phosphatase: 90 U/L (ref 39–117)
BUN: 12 mg/dL (ref 6–23)
CO2: 28 mEq/L (ref 19–32)
Calcium: 10.5 mg/dL (ref 8.4–10.5)
Chloride: 97 mEq/L (ref 96–112)
Creatinine, Ser: 0.85 mg/dL (ref 0.40–1.20)
GFR: 84.89 mL/min (ref >60.00)
Glucose, Bld: 109 mg/dL — ABNORMAL HIGH (ref 70–99)
Potassium: 4.4 mEq/L (ref 3.5–5.1)
Sodium: 135 mEq/L (ref 135–145)
Total Bilirubin: 0.6 mg/dL (ref 0.2–1.2)
Total Protein: 7.5 g/dL (ref 6.0–8.3)

## 2019-02-12 LAB — HEMOGLOBIN A1C: Hgb A1c MFr Bld: 7.8 % — ABNORMAL HIGH (ref 4.6–6.5)

## 2019-02-12 LAB — MICROALBUMIN / CREATININE URINE RATIO
Creatinine, Urine: 130 mg/dL (ref 20–275)
Microalb Creat Ratio: 7 mcg/mg creat (ref <30)
Microalb, Ur: 0.9 mg/dL

## 2019-02-13 ENCOUNTER — Other Ambulatory Visit: Payer: Self-pay | Admitting: Family Medicine

## 2019-02-13 ENCOUNTER — Encounter: Payer: Self-pay | Admitting: Family Medicine

## 2019-02-13 ENCOUNTER — Ambulatory Visit (INDEPENDENT_AMBULATORY_CARE_PROVIDER_SITE_OTHER): Payer: 59 | Admitting: Family Medicine

## 2019-02-13 ENCOUNTER — Other Ambulatory Visit: Payer: Self-pay | Admitting: Internal Medicine

## 2019-02-13 ENCOUNTER — Other Ambulatory Visit: Payer: Self-pay

## 2019-02-13 VITALS — BP 118/64 | HR 96 | Temp 96.4°F | Ht 66.5 in | Wt 204.4 lb

## 2019-02-13 DIAGNOSIS — Z23 Encounter for immunization: Secondary | ICD-10-CM | POA: Diagnosis not present

## 2019-02-13 DIAGNOSIS — E559 Vitamin D deficiency, unspecified: Secondary | ICD-10-CM

## 2019-02-13 DIAGNOSIS — E119 Type 2 diabetes mellitus without complications: Secondary | ICD-10-CM

## 2019-02-13 LAB — TSH: TSH: 1.21 u[IU]/mL (ref 0.35–4.50)

## 2019-02-13 LAB — VITAMIN D 25 HYDROXY (VIT D DEFICIENCY, FRACTURES): VITD: 18.93 ng/mL — ABNORMAL LOW (ref 30.00–100.00)

## 2019-02-13 MED ORDER — VITAMIN D 25 MCG (1000 UNIT) PO TABS: 2000.0000 [IU] | ORAL_TABLET | Freq: Every day | ORAL | 1 refills | Status: DC

## 2019-02-13 MED ORDER — CHOLECALCIFEROL 1.25 MG (50000 UT) PO CAPS: 50000.0000 [IU] | ORAL_CAPSULE | ORAL | 1 refills | Status: DC

## 2019-02-13 MED ORDER — TRULICITY 1.5 MG/0.5ML ~~LOC~~ SOAJ: SUBCUTANEOUS | 5 refills | Status: DC

## 2019-02-13 NOTE — Progress Notes (Signed)
Subjective:    Patient ID: Cassandra [name], female    DOB: [birth year], 53 y.o.   MRN: [id number]  HPI   Patient presents to clinic for follow-up on diabetes after addition of Jardiance to her regimen of Metformin and Trulicity.  A1c has greatly improved.  It was 8.5%, now 7.8%.  Tolerating new medication without any problems.  She is pleased with her lab results.  Other lab results reviewed with patient as well, everything else is normal except for vitamin D being well.  She is currently on vitamin D 1000 units/day.  Patient reassured that her blood counts are normal, electrolytes liver and kidney functions are normal, cholesterol is normal and thyroid function normal.  Patient Active Problem List   Diagnosis Date Noted  . Hyperlipidemia 05/05/2018  . Menorrhagia with regular cycle 12/25/2017  . Suprapubic pain 12/15/2015  . GERD (gastroesophageal reflux disease) 11/01/2015  . Diarrhea 05/19/2015  . Routine physical examination 02/20/2015  . Diabetes mellitus type 2, controlled, without complications ([place]) 60/73/7106  . Benign essential HTN 02/20/2015  . Herpes simplex 02/20/2015   Social History   Tobacco Use  . Smoking status: Never Smoker  . Smokeless tobacco: Never Used  Substance Use Topics  . Alcohol use: No    Alcohol/week: 0.0 standard drinks   Review of Systems  Constitutional: Negative for chills, fatigue and fever.  HENT: Negative for congestion, ear pain, sinus pain and sore throat.   Eyes: Negative.   Respiratory: Negative for cough, shortness of breath and wheezing.   Cardiovascular: Negative for chest pain, palpitations and leg swelling.  Gastrointestinal: Negative for abdominal pain, diarrhea, nausea and vomiting.  Genitourinary: Negative for dysuria, frequency and urgency.  Musculoskeletal: Negative for arthralgias and myalgias.  Skin: Negative for color change, pallor and rash.  Neurological: Negative for syncope, light-headedness and headaches.   Psychiatric/Behavioral: The patient is not nervous/anxious.       Objective:   Physical Exam Vitals signs and nursing note reviewed.  Constitutional:      General: She is not in acute distress.    Appearance: She is not toxic-appearing.  HENT:     Head: Normocephalic and atraumatic.  Eyes:     General: No scleral icterus.    Extraocular Movements: Extraocular movements intact.     Conjunctiva/sclera: Conjunctivae normal.  Neck:     Musculoskeletal: Normal range of motion and neck supple. No neck rigidity.  Cardiovascular:     Rate and Rhythm: Normal rate and regular rhythm.     Heart sounds: Normal heart sounds.  Pulmonary:     Breath sounds: Normal breath sounds.  Musculoskeletal:     Right lower leg: No edema.     Left lower leg: No edema.  Skin:    General: Skin is warm and dry.     Coloration: Skin is not jaundiced or pale.  Neurological:     Mental Status: She is alert and oriented to person, place, and time.     Gait: Gait normal.  Psychiatric:        Mood and Affect: Mood normal.        Behavior: Behavior normal.    Today's Vitals   02/13/19 1541  BP: 118/64  Pulse: 96  Temp: (!) 96.4 F (35.8 C)  TempSrc: Temporal  SpO2: 98%  Weight: 204 lb 6.4 oz (92.7 kg)  Height: 5' 6.5" (1.689 m)   Body mass index is 32.5 kg/m.     Assessment & Plan:  Type 2 diabetes mellitus without complication, without long-term current use of insulin (HCC) - Plan: Dulaglutide (TRULICITY) 1.5 MG/0.5ML SOPN  Vitamin D deficiency - Plan: cholecalciferol (VITAMIN D3) 25 MCG (1000 UT) tablet  Need for shingles vaccine  Patient will continue all current diabetes medicines.  Trulicity refill given.  She will increase vitamin D3 supplement to 2000 units/day.  Shingles vaccine given in clinic.  Reviewed healthy diet and regular physical activity, recommended balanced meals with good proteins, vegetables, fruits and whole grains as well as good water intake.  She will follow up  in 3 months with PCP.  She will get second shingles vaccine at that follow-up visit

## 2019-04-22 ENCOUNTER — Other Ambulatory Visit: Payer: Self-pay | Admitting: Family

## 2019-04-22 DIAGNOSIS — R197 Diarrhea, unspecified: Secondary | ICD-10-CM

## 2019-04-22 DIAGNOSIS — E119 Type 2 diabetes mellitus without complications: Secondary | ICD-10-CM

## 2019-04-30 ENCOUNTER — Other Ambulatory Visit: Payer: Self-pay | Admitting: Family

## 2019-04-30 DIAGNOSIS — Z1231 Encounter for screening mammogram for malignant neoplasm of breast: Secondary | ICD-10-CM

## 2019-05-12 ENCOUNTER — Ambulatory Visit
Admission: RE | Admit: 2019-05-12 | Discharge: 2019-05-12 | Disposition: A | Discharge status: Home / Self Care | Payer: No Typology Code available for payment source | Source: Ambulatory Visit | Attending: Family | Admitting: Family

## 2019-05-12 DIAGNOSIS — Z1231 Encounter for screening mammogram for malignant neoplasm of breast: Secondary | ICD-10-CM | POA: Insufficient documentation

## 2019-05-29 ENCOUNTER — Other Ambulatory Visit: Payer: Self-pay | Admitting: Family

## 2019-05-29 DIAGNOSIS — E119 Type 2 diabetes mellitus without complications: Secondary | ICD-10-CM

## 2019-06-05 ENCOUNTER — Encounter: Payer: Self-pay | Admitting: Family

## 2019-06-05 ENCOUNTER — Ambulatory Visit (INDEPENDENT_AMBULATORY_CARE_PROVIDER_SITE_OTHER): Payer: No Typology Code available for payment source | Admitting: Family

## 2019-06-05 ENCOUNTER — Other Ambulatory Visit: Payer: Self-pay

## 2019-06-05 VITALS — Ht 67.0 in | Wt 197.0 lb

## 2019-06-05 DIAGNOSIS — I10 Essential (primary) hypertension: Secondary | ICD-10-CM | POA: Diagnosis not present

## 2019-06-05 DIAGNOSIS — E785 Hyperlipidemia, unspecified: Secondary | ICD-10-CM | POA: Diagnosis not present

## 2019-06-05 DIAGNOSIS — E119 Type 2 diabetes mellitus without complications: Secondary | ICD-10-CM

## 2019-06-05 NOTE — Progress Notes (Signed)
Virtual Visit via Video Note  I connected with@  on 06/05/19 at  4:00 PM EST by a video enabled telemedicine application and verified that I am speaking with the correct person using two identifiers.  Location patient: home Location provider:work or home office Persons participating in the virtual visit: patient, provider  I discussed the limitations of evaluation and management by telemedicine and the availability of in person appointments. The patient expressed understanding and agreed to proceed.   HPI: Feels well today. No complaints.   HTN- hasnt checked BP. Denies exertional chest pain or pressure, numbness or tingling radiating to left arm or jaw, palpitations, dizziness, frequent headaches, changes in vision, or shortness of breath.    DM- compliant with medication. Lost weight over the past year. Due for eye exam   HLD- LDL 74. Compliant with lipitor.   Has been walking, elliptical for 15 minutes 3 days per week.  No cp, sob. 11,000 steps per day.   Due shingles vaccine.  MM UTD   ROS: See pertinent positives and negatives per HPI.  Past Medical History:  Diagnosis Date  . Chicken pox   . Diabetes mellitus without complication ([place])   . Herpes   . Hypertension     Past Surgical History:  Procedure Laterality Date  . CHOLECYSTECTOMY  2015  . HERNIA REPAIR     54 years old  . TUBAL LIGATION  1991    Family History  Problem Relation Age of Onset  . Hypertension Mother   . Diabetes Mother   . Breast cancer Neg Hx   . Thyroid cancer Neg Hx     SOCIAL HX: never smoker   Current Outpatient Medications:  .  atorvastatin (LIPITOR) 20 MG tablet, Take 1 tablet (20 mg total) by mouth daily., Disp: 90 tablet, Rfl: 3 .  Cholecalciferol 1.25 MG (50000 UT) capsule, Take 1 capsule (50,000 Units total) by mouth once a week., Disp: 13 capsule, Rfl: 1 .  Dulaglutide (TRULICITY) 1.5 UM/3.5TI SOPN, INJECT 1.5 MG INTO THE SKIN ONCE A WEEK., Disp: 2 mL, Rfl: 5 .   JARDIANCE 10 MG TABS tablet, TAKE 1 TABLET BY MOUTH ONCE DAILY IN THE MORNING., Disp: 90 tablet, Rfl: 1 .  lisinopril (PRINIVIL,ZESTRIL) 10 MG tablet, Take 1 tablet (10 mg total) by mouth daily., Disp: 90 tablet, Rfl: 3 .  metFORMIN (GLUCOPHAGE-XR) 500 MG 24 hr tablet, TAKE 2 TABLETS (1,000 MG) BY MOUTH 2 TIMES DAILY., Disp: 120 tablet, Rfl: 1 .  valACYclovir (VALTREX) 500 MG tablet, Take 1 tablet twice daily by mouth for 3 days at first sign of outbreak, Disp: 30 tablet, Rfl: 0  EXAM:  VITALS per patient if applicable: Wt Readings from Last 3 Encounters:  06/05/19 197 lb (89.4 kg)  02/13/19 204 lb 6.4 oz (92.7 kg)  05/05/18 215 lb 6.4 oz (97.7 kg)    GENERAL: alert, oriented, appears well and in no acute distress  HEENT: atraumatic, conjunttiva clear, no obvious abnormalities on inspection of external nose and ears  NECK: normal movements of the head and neck  LUNGS: on inspection no signs of respiratory distress, breathing rate appears normal, no obvious gross SOB, gasping or wheezing  CV: no obvious cyanosis  MS: moves all visible extremities without noticeable abnormality  PSYCH/NEURO: pleasant and cooperative, no obvious depression or anxiety, speech and thought processing grossly intact  ASSESSMENT AND PLAN:  Discussed the following assessment and plan:  Benign essential HTN - Plan: Hemoglobin A1c, Comprehensive metabolic panel  Controlled type  2 diabetes mellitus without complication, without long-term current use of insulin (HCC)  Hyperlipidemia, unspecified hyperlipidemia type Problem List Items Addressed This Visit      Cardiovascular and Mediastinum   Benign essential HTN - Primary    BP Readings from Last 3 Encounters:  02/13/19 118/64  05/05/18 118/64  01/13/18 119/80   Suspect stable. She is also historically at goal, will continue current regimen      Relevant Orders   Hemoglobin A1c   Comprehensive metabolic panel     Endocrine   Diabetes mellitus  type 2, controlled, without complications (HCC)    Uncontrolled.  Pending A1c.  We discussed likely increasing Trulicity for added benefit of possible weight loss. Advised to stop drinking soda. F/u 3 months.         Other   Hyperlipidemia    Stable ,LDL is very close to 70 ( hers 74) .  We will continue current regimen at this time         -we discussed possible serious and likely etiologies, options for evaluation and workup, limitations of telemedicine visit vs in person visit, treatment, treatment risks and precautions. Pt prefers to treat via telemedicine empirically rather then risking or undertaking an in person visit at this moment. Patient agrees to seek prompt in person care if worsening, new symptoms arise, or if is not improving with treatment.   I discussed the assessment and treatment plan with the patient. The patient was provided an opportunity to ask questions and all were answered. The patient agreed with the plan and demonstrated an understanding of the instructions.   The patient was advised to call back or seek an in-person evaluation if the symptoms worsen or if the condition fails to improve as anticipated.   Rennie Plowman, FNP

## 2019-06-05 NOTE — Assessment & Plan Note (Signed)
Uncontrolled.  Pending A1c.  We discussed likely increasing Trulicity for added benefit of possible weight loss. Advised to stop drinking soda. F/u 3 months.

## 2019-06-05 NOTE — Assessment & Plan Note (Signed)
Stable ,LDL is very close to 70 ( hers 74) .  We will continue current regimen at this time

## 2019-06-05 NOTE — Assessment & Plan Note (Addendum)
BP Readings from Last 3 Encounters:  02/13/19 118/64  05/05/18 118/64  01/13/18 119/80   Suspect stable. She is also historically at goal, will continue current regimen

## 2019-06-08 ENCOUNTER — Telehealth: Payer: Self-pay | Admitting: Family

## 2019-06-08 NOTE — Telephone Encounter (Signed)
Left patient a voicemail she needs to schedule a 3 month follow up. Verify insurance information and address.

## 2019-06-24 ENCOUNTER — Ambulatory Visit (INDEPENDENT_AMBULATORY_CARE_PROVIDER_SITE_OTHER): Payer: No Typology Code available for payment source | Admitting: Lab

## 2019-06-24 ENCOUNTER — Other Ambulatory Visit: Payer: Self-pay | Admitting: Family

## 2019-06-24 ENCOUNTER — Other Ambulatory Visit (INDEPENDENT_AMBULATORY_CARE_PROVIDER_SITE_OTHER): Payer: No Typology Code available for payment source

## 2019-06-24 ENCOUNTER — Other Ambulatory Visit: Payer: Self-pay

## 2019-06-24 DIAGNOSIS — Z23 Encounter for immunization: Secondary | ICD-10-CM

## 2019-06-24 DIAGNOSIS — I10 Essential (primary) hypertension: Secondary | ICD-10-CM | POA: Diagnosis not present

## 2019-06-24 DIAGNOSIS — E119 Type 2 diabetes mellitus without complications: Secondary | ICD-10-CM

## 2019-06-24 NOTE — Progress Notes (Signed)
Pt in office today for Shingles injection in R- Deltoid. Pt tolerated well.

## 2019-06-25 LAB — COMPREHENSIVE METABOLIC PANEL
ALT: 15 U/L (ref 0–35)
AST: 16 U/L (ref 0–37)
Albumin: 4.1 g/dL (ref 3.5–5.2)
Alkaline Phosphatase: 81 U/L (ref 39–117)
BUN: 18 mg/dL (ref 6–23)
CO2: 26 mEq/L (ref 19–32)
Calcium: 9.7 mg/dL (ref 8.4–10.5)
Chloride: 103 mEq/L (ref 96–112)
Creatinine, Ser: 0.92 mg/dL (ref 0.40–1.20)
GFR: 77.37 mL/min (ref >60.00)
Glucose, Bld: 122 mg/dL — ABNORMAL HIGH (ref 70–99)
Potassium: 4.2 mEq/L (ref 3.5–5.1)
Sodium: 137 mEq/L (ref 135–145)
Total Bilirubin: 0.4 mg/dL (ref 0.2–1.2)
Total Protein: 7.6 g/dL (ref 6.0–8.3)

## 2019-06-25 LAB — HEMOGLOBIN A1C: Hgb A1c MFr Bld: 7.6 % — ABNORMAL HIGH (ref 4.6–6.5)

## 2019-06-29 ENCOUNTER — Other Ambulatory Visit: Payer: Self-pay | Admitting: Family

## 2019-06-29 DIAGNOSIS — E119 Type 2 diabetes mellitus without complications: Secondary | ICD-10-CM

## 2019-06-29 MED ORDER — TRULICITY 0.75 MG/0.5ML ~~LOC~~ SOAJ: 3.0000 mg | SUBCUTANEOUS | 3 refills | Status: DC

## 2019-07-02 ENCOUNTER — Other Ambulatory Visit: Payer: Self-pay | Admitting: Family

## 2019-07-02 DIAGNOSIS — R197 Diarrhea, unspecified: Secondary | ICD-10-CM

## 2019-07-02 DIAGNOSIS — E119 Type 2 diabetes mellitus without complications: Secondary | ICD-10-CM

## 2019-07-02 MED ORDER — METFORMIN HCL ER 500 MG PO TB24: 1000.0000 mg | ORAL_TABLET | Freq: Every day | ORAL | 1 refills | Status: DC

## 2019-07-22 LAB — HM DIABETES EYE EXAM

## 2019-08-25 ENCOUNTER — Telehealth: Payer: Self-pay | Admitting: Family

## 2019-08-25 NOTE — Telephone Encounter (Signed)
Pt needs clarification on metformin, Pt states that she used to take four a day and now two a day. She wants to know which is correct. Please advise.

## 2019-08-25 NOTE — Telephone Encounter (Signed)
I have read notes & see where patient was taking the metformin 2 tablets 2x daily, but when I reorder not sure why it was changed? I see where trulicty was increased in March. Which dose should patient be taking?

## 2019-08-25 NOTE — Telephone Encounter (Signed)
Call pt Cassandra [name] increased in march to 3mg  qwk Appears metformin 1000mg  qam. Not sure what dose she should be on.  How has she been taking the medication?  How was she taking metformin with a1c drawn in March?    Please order a1c for June and schedule f/u with me in June after.

## 2019-08-26 ENCOUNTER — Other Ambulatory Visit: Payer: Self-pay

## 2019-08-26 ENCOUNTER — Telehealth: Payer: Self-pay

## 2019-08-26 DIAGNOSIS — R197 Diarrhea, unspecified: Secondary | ICD-10-CM

## 2019-08-26 DIAGNOSIS — E119 Type 2 diabetes mellitus without complications: Secondary | ICD-10-CM

## 2019-08-26 MED ORDER — TRULICITY 3 MG/0.5ML ~~LOC~~ SOAJ: SUBCUTANEOUS | 1 refills | Status: DC

## 2019-08-26 MED ORDER — TRULICITY 0.75 MG/0.5ML ~~LOC~~ SOAJ: 3.0000 mg | SUBCUTANEOUS | 3 refills | Status: DC

## 2019-08-26 MED ORDER — METFORMIN HCL ER 500 MG PO TB24: 1000.0000 mg | ORAL_TABLET | Freq: Two times a day (BID) | ORAL | 1 refills | Status: DC

## 2019-08-26 NOTE — Telephone Encounter (Signed)
I have called [facility] pharmacy to clarify & have resent script to correct.

## 2019-08-26 NOTE — Telephone Encounter (Signed)
[facility] pharmacy called and needs clarification on Dulaglutide (TRULICITY) 0.75 MG/0.5ML SOPN

## 2019-08-26 NOTE — Telephone Encounter (Signed)
Patient stated that she had been taking metformin 1000mg  twice daily. So this would be the 4 tablets. I have corrected & resent to pharmacy. Patient is scheduled for a1c 6/14.

## 2019-08-26 NOTE — Telephone Encounter (Signed)
LM once again to call back.  

## 2019-08-26 NOTE — Telephone Encounter (Signed)
LMTCB

## 2019-09-28 ENCOUNTER — Other Ambulatory Visit: Payer: No Typology Code available for payment source

## 2019-10-01 ENCOUNTER — Other Ambulatory Visit: Payer: Self-pay

## 2019-10-01 ENCOUNTER — Other Ambulatory Visit (INDEPENDENT_AMBULATORY_CARE_PROVIDER_SITE_OTHER): Payer: No Typology Code available for payment source

## 2019-10-01 DIAGNOSIS — E119 Type 2 diabetes mellitus without complications: Secondary | ICD-10-CM

## 2019-10-01 LAB — POCT GLYCOSYLATED HEMOGLOBIN (HGB A1C): Hemoglobin A1C: 6.4 % — AB (ref 4.0–5.6)

## 2019-12-01 ENCOUNTER — Other Ambulatory Visit: Payer: Self-pay | Admitting: Family

## 2019-12-01 DIAGNOSIS — E119 Type 2 diabetes mellitus without complications: Secondary | ICD-10-CM

## 2019-12-22 ENCOUNTER — Other Ambulatory Visit: Payer: Self-pay | Admitting: Family

## 2020-01-08 ENCOUNTER — Other Ambulatory Visit: Payer: Self-pay

## 2020-01-12 ENCOUNTER — Other Ambulatory Visit: Payer: Self-pay

## 2020-01-12 ENCOUNTER — Encounter: Payer: Self-pay | Admitting: Family

## 2020-01-12 ENCOUNTER — Ambulatory Visit (INDEPENDENT_AMBULATORY_CARE_PROVIDER_SITE_OTHER): Payer: No Typology Code available for payment source | Admitting: Family

## 2020-01-12 VITALS — BP 138/78 | HR 86 | Temp 97.7°F | Ht 66.0 in | Wt 201.8 lb

## 2020-01-12 DIAGNOSIS — M79671 Pain in right foot: Secondary | ICD-10-CM

## 2020-01-12 DIAGNOSIS — E785 Hyperlipidemia, unspecified: Secondary | ICD-10-CM

## 2020-01-12 DIAGNOSIS — I10 Essential (primary) hypertension: Secondary | ICD-10-CM

## 2020-01-12 DIAGNOSIS — Z0000 Encounter for general adult medical examination without abnormal findings: Secondary | ICD-10-CM | POA: Diagnosis not present

## 2020-01-12 DIAGNOSIS — E049 Nontoxic goiter, unspecified: Secondary | ICD-10-CM | POA: Diagnosis not present

## 2020-01-12 DIAGNOSIS — E119 Type 2 diabetes mellitus without complications: Secondary | ICD-10-CM

## 2020-01-12 LAB — COMPREHENSIVE METABOLIC PANEL
ALT: 14 U/L (ref 0–35)
AST: 15 U/L (ref 0–37)
Albumin: 4 g/dL (ref 3.5–5.2)
Alkaline Phosphatase: 68 U/L (ref 39–117)
BUN: 12 mg/dL (ref 6–23)
CO2: 29 mEq/L (ref 19–32)
Calcium: 9 mg/dL (ref 8.4–10.5)
Chloride: 105 mEq/L (ref 96–112)
Creatinine, Ser: 0.61 mg/dL (ref 0.40–1.20)
GFR: 124.05 mL/min (ref >60.00)
Glucose, Bld: 82 mg/dL (ref 70–99)
Potassium: 3.8 mEq/L (ref 3.5–5.1)
Sodium: 140 mEq/L (ref 135–145)
Total Bilirubin: 0.4 mg/dL (ref 0.2–1.2)
Total Protein: 6.6 g/dL (ref 6.0–8.3)

## 2020-01-12 LAB — CBC WITH DIFFERENTIAL/PLATELET
Basophils Absolute: 0 10*3/uL (ref 0.0–0.1)
Basophils Relative: 0.4 % (ref 0.0–3.0)
Eosinophils Absolute: 0.1 10*3/uL (ref 0.0–0.7)
Eosinophils Relative: 1.2 % (ref 0.0–5.0)
HCT: 38.9 % (ref 36.0–46.0)
Hemoglobin: 12.3 g/dL (ref 12.0–15.0)
Lymphocytes Relative: 40.7 % (ref 12.0–46.0)
Lymphs Abs: 2.2 10*3/uL (ref 0.7–4.0)
MCHC: 31.7 g/dL (ref 30.0–36.0)
MCV: 82.8 fl (ref 78.0–100.0)
Monocytes Absolute: 0.4 10*3/uL (ref 0.1–1.0)
Monocytes Relative: 7.3 % (ref 3.0–12.0)
Neutro Abs: 2.7 10*3/uL (ref 1.4–7.7)
Neutrophils Relative %: 50.4 % (ref 43.0–77.0)
Platelets: 269 10*3/uL (ref 150.0–400.0)
RBC: 4.7 Mil/uL (ref 3.87–5.11)
RDW: 14.7 % (ref 11.5–15.5)
WBC: 5.4 10*3/uL (ref 4.0–10.5)

## 2020-01-12 LAB — LIPID PANEL
Cholesterol: 144 mg/dL (ref 0–200)
HDL: 61.1 mg/dL (ref >39.00)
LDL Cholesterol: 71 mg/dL (ref 0–99)
NonHDL: 82.56
Total CHOL/HDL Ratio: 2
Triglycerides: 58 mg/dL (ref 0.0–149.0)
VLDL: 11.6 mg/dL (ref 0.0–40.0)

## 2020-01-12 LAB — HEMOGLOBIN A1C: Hgb A1c MFr Bld: 7.1 % — ABNORMAL HIGH (ref 4.6–6.5)

## 2020-01-12 LAB — VITAMIN D 25 HYDROXY (VIT D DEFICIENCY, FRACTURES): VITD: 29.26 ng/mL — ABNORMAL LOW (ref 30.00–100.00)

## 2020-01-12 LAB — MICROALBUMIN / CREATININE URINE RATIO
Creatinine,U: 92.8 mg/dL
Microalb Creat Ratio: 0.9 mg/g (ref 0.0–30.0)
Microalb, Ur: 0.8 mg/dL (ref 0.0–1.9)

## 2020-01-12 LAB — TSH: TSH: 1.79 u[IU]/mL (ref 0.35–4.50)

## 2020-01-12 NOTE — Assessment & Plan Note (Addendum)
Presume stable, continue lipitor 20mg .pending lipid panel.

## 2020-01-12 NOTE — Patient Instructions (Addendum)
Trial stop the lipitor.  Then see if bodyaches resolves. It may take lipitor once per week and gradually increase monitoring for symptoms.   It is imperative that you are seen AT least twice per year for labs and monitoring. Monitor blood pressure at home and me 5-6 reading on separate days. Goal is less than 120/80, based on newest guidelines, however we certainly want to be less than 130/80;  if persistently higher, please make sooner follow up appointment so we can recheck you blood pressure and manage/ adjust medications.  Referral to podiatry for plantar fasciitis and ultrasound of thyroid.  Let us know if you dont hear back within a week in regards to an appointment being scheduled.     Health Maintenance for Postmenopausal Women Menopause is a normal process in which your ability to get pregnant comes to an end. This process happens slowly over many months or years, usually between the ages of 41 and 36. Menopause is complete when you have missed your menstrual periods for 12 months. It is important to talk with your health care provider about some of the most common conditions that affect women after menopause (postmenopausal women). These include heart disease, cancer, and bone loss (osteoporosis). Adopting a healthy lifestyle and getting preventive care can help to promote your health and wellness. The actions you take can also lower your chances of developing some of these common conditions. What should I know about menopause? During menopause, you may get a number of symptoms, such as:  Hot flashes. These can be moderate or severe.  Night sweats.  Decrease in sex drive.  Mood swings.  Headaches.  Tiredness.  Irritability.  Memory problems.  Insomnia. Choosing to treat or not to treat these symptoms is a decision that you make with your health care provider. Do I need hormone replacement therapy?  Hormone replacement therapy is effective in treating symptoms that are  caused by menopause, such as hot flashes and night sweats.  Hormone replacement carries certain risks, especially as you become older. If you are thinking about using estrogen or estrogen with progestin, discuss the benefits and risks with your health care provider. What is my risk for heart disease and stroke? The risk of heart disease, heart attack, and stroke increases as you age. One of the causes may be a change in the body's hormones during menopause. This can affect how your body uses dietary fats, triglycerides, and cholesterol. Heart attack and stroke are medical emergencies. There are many things that you can do to help prevent heart disease and stroke. Watch your blood pressure  High blood pressure causes heart disease and increases the risk of stroke. This is more likely to develop in people who have high blood pressure readings, are of African descent, or are overweight.  Have your blood pressure checked: ? Every 3-5 years if you are 38-22 years of age. ? Every year if you are 40 years old or older. Eat a healthy diet   Eat a diet that includes plenty of vegetables, fruits, low-fat dairy products, and lean protein.  Do not eat a lot of foods that are high in solid fats, added sugars, or sodium. Get regular exercise Get regular exercise. This is one of the most important things you can do for your health. Most adults should:  Try to exercise for at least 150 minutes each week. The exercise should increase your heart rate and make you sweat (moderate-intensity exercise).  Try to do strengthening exercises at least  twice each week. Do these in addition to the moderate-intensity exercise.  Spend less time sitting. Even light physical activity can be beneficial. Other tips  Work with your health care provider to achieve or maintain a healthy weight.  Do not use any products that contain nicotine or tobacco, such as cigarettes, e-cigarettes, and chewing tobacco. If you need help  quitting, ask your health care provider.  Know your numbers. Ask your health care provider to check your cholesterol and your blood sugar (glucose). Continue to have your blood tested as directed by your health care provider. Do I need screening for cancer? Depending on your health history and family history, you may need to have cancer screening at different stages of your life. This may include screening for:  Breast cancer.  Cervical cancer.  Lung cancer.  Colorectal cancer. What is my risk for osteoporosis? After menopause, you may be at increased risk for osteoporosis. Osteoporosis is a condition in which bone destruction happens more quickly than new bone creation. To help prevent osteoporosis or the bone fractures that can happen because of osteoporosis, you may take the following actions:  If you are 22-43 years old, get at least 1,000 mg of calcium and at least 600 mg of vitamin D per day.  If you are older than age 63 but younger than age 5, get at least 1,200 mg of calcium and at least 600 mg of vitamin D per day.  If you are older than age 72, get at least 1,200 mg of calcium and at least 800 mg of vitamin D per day. Smoking and drinking excessive alcohol increase the risk of osteoporosis. Eat foods that are rich in calcium and vitamin D, and do weight-bearing exercises several times each week as directed by your health care provider. How does menopause affect my mental health? Depression may occur at any age, but it is more common as you become older. Common symptoms of depression include:  Low or sad mood.  Changes in sleep patterns.  Changes in appetite or eating patterns.  Feeling an overall lack of motivation or enjoyment of activities that you previously enjoyed.  Frequent crying spells. Talk with your health care provider if you think that you are experiencing depression. General instructions See your health care provider for regular wellness exams and  vaccines. This may include:  Scheduling regular health, dental, and eye exams.  Getting and maintaining your vaccines. These include: ? Influenza vaccine. Get this vaccine each year before the flu season begins. ? Pneumonia vaccine. ? Shingles vaccine. ? Tetanus, diphtheria, and pertussis (Tdap) booster vaccine. Your health care provider may also recommend other immunizations. Tell your health care provider if you have ever been abused or do not feel safe at home. Summary  Menopause is a normal process in which your ability to get pregnant comes to an end.  This condition causes hot flashes, night sweats, decreased interest in sex, mood swings, headaches, or lack of sleep.  Treatment for this condition may include hormone replacement therapy.  Take actions to keep yourself healthy, including exercising regularly, eating a healthy diet, watching your weight, and checking your blood pressure and blood sugar levels.  Get screened for cancer and depression. Make sure that you are up to date with all your vaccines. This information is not intended to replace advice given to you by your health care provider. Make sure you discuss any questions you have with your health care provider. Document Revised: 03/26/2018 Document Reviewed: 03/26/2018 Elsevier  Patient Education  [employer].

## 2020-01-12 NOTE — Progress Notes (Signed)
Subjective:    Patient ID: Cassandra [name], female    DOB: 1966/03/10, 54 y.o.   MRN: [id number]  CC: Cassandra [name] is a 54 y.o. female who presents today for physical exam and chronic condition follow up.    HPI: Complains of all over 'body aching' for couple of years,  comes and goes. No numbness, injuries, falls, rash, fever, joint swelling.  Complains of right ventral pain under the foot. Worse at sitting for period of time. Comes and goes. Wore in the morning. Wearing supportive shoes.   HTN- compliant with lisinopril 10mg . Not checking BP at home. Endorses high salt meal last night.  HLD- compliant with lipitor 20mg  DM-  Feels well controlled. compliant with metformin 1000mg  BID, trulicity 3mg  qwk, jardiance 10mg      Colorectal Cancer Screening: UTD , 6 years ago Breast Cancer Screening: Mammogram UTD Cervical Cancer Screening: UTD; follows with GYN, [facility]  Immunizations       Tetanus - utd        Labs: Screening labs today. Exercise: Gets regular exercise.  Alcohol use: none Smoking/tobacco use: Nonsmoker.    HISTORY:  Past Medical History:  Diagnosis Date  . Chicken pox   . Diabetes mellitus without complication (HCC)   . Herpes   . Hypertension     Past Surgical History:  Procedure Laterality Date  . CHOLECYSTECTOMY  2015  . HERNIA REPAIR     53 years old  . TUBAL LIGATION  1991   Family History  Problem Relation Age of Onset  . Hypertension Mother   . Diabetes Mother   . Breast cancer Neg Hx   . Thyroid cancer Neg Hx       ALLERGIES: Glipizide  Current Outpatient Medications on File Prior to Visit  Medication Sig Dispense Refill  . atorvastatin (LIPITOR) 20 MG tablet TAKE 1 TABLET BY MOUTH DAILY. 90 tablet 3  . Dulaglutide (TRULICITY) 3 MG/0.5ML SOPN INJECT 3 MG INTO THE SKIN ONCE A WEEK. 2 mL 3  . JARDIANCE 10 MG TABS tablet TAKE 1 TABLET BY MOUTH ONCE DAILY IN THE MORNING. 90 tablet 1  . lisinopril (ZESTRIL) 10 MG tablet TAKE 1  TABLET BY MOUTH DAILY. 90 tablet 3  . metFORMIN (GLUCOPHAGE-XR) 500 MG 24 hr tablet Take 2 tablets (1,000 mg total) by mouth 2 (two) times daily with a meal. 360 tablet 1   No current facility-administered medications on file prior to visit.    Social History   Tobacco Use  . Smoking status: Never Smoker  . Smokeless tobacco: Never Used  Vaping Use  . Vaping Use: Never used  Substance Use Topics  . Alcohol use: No    Alcohol/week: 0.0 standard drinks  . Drug use: No    Review of Systems  Constitutional: Negative for chills, fever and unexpected weight change.  HENT: Negative for congestion and trouble swallowing.   Respiratory: Negative for cough.   Cardiovascular: Negative for chest pain, palpitations and leg swelling.  Gastrointestinal: Negative for nausea and vomiting.  Musculoskeletal: Positive for arthralgias (right foot) and myalgias. Negative for joint swelling.  Skin: Negative for rash.  Neurological: Negative for headaches.  Hematological: Negative for adenopathy.  Psychiatric/Behavioral: Negative for confusion.      Objective:    BP 138/78   Pulse 86   Temp 97.7 F (36.5 C)   Ht 5\' 6"  (1.676 m)   Wt 201 lb 12.8 oz (91.5 kg)   SpO2 99%   BMI 32.57  kg/m   BP Readings from Last 3 Encounters:  01/12/20 138/78  02/13/19 118/64  05/05/18 118/64   Wt Readings from Last 3 Encounters:  01/12/20 201 lb 12.8 oz (91.5 kg)  06/05/19 197 lb (89.4 kg)  02/13/19 204 lb 6.4 oz (92.7 kg)    Physical Exam Vitals reviewed.  Constitutional:      Appearance: She is well-developed.  Eyes:     Conjunctiva/sclera: Conjunctivae normal.  Neck:     Thyroid: Thyromegaly present. No thyroid mass.  Cardiovascular:     Rate and Rhythm: Normal rate and regular rhythm.     Pulses: Normal pulses.     Heart sounds: Normal heart sounds.  Pulmonary:     Effort: Pulmonary effort is normal.     Breath sounds: Normal breath sounds. No wheezing, rhonchi or rales.  Chest:      Breasts: Breasts are symmetrical.        Right: No inverted nipple, mass, nipple discharge, skin change or tenderness.        Left: No inverted nipple, mass, nipple discharge, skin change or tenderness.  Musculoskeletal:     Right foot: Normal range of motion. No swelling or tenderness.     Comments: Right ventral foot pain with dorsiflexion.  Lymphadenopathy:     Head:     Right side of head: No submental, submandibular, tonsillar, preauricular, posterior auricular or occipital adenopathy.     Left side of head: No submental, submandibular, tonsillar, preauricular, posterior auricular or occipital adenopathy.     Cervical: No cervical adenopathy.     Right cervical: No superficial, deep or posterior cervical adenopathy.    Left cervical: No superficial, deep or posterior cervical adenopathy.  Skin:    General: Skin is warm and dry.  Neurological:     Mental Status: She is alert.  Psychiatric:        Speech: Speech normal.        Behavior: Behavior normal.        Thought Content: Thought content normal.        Assessment & Plan:   Problem List Items Addressed This Visit      Cardiovascular and Mediastinum   Benign essential HTN    Slightly elevated today. Patient will keep BP log and if persistent, she will let me know.continue lisinopril 10mg  qd        Endocrine   Diabetes mellitus type 2, controlled, without complications (HCC)    Stable. Continue metformin 1000mg  BID, trulicity 3mg  qwk, jardiance 10mg          Other   Hyperlipidemia    Presume stable, continue lipitor 20mg .pending lipid panel.       Right foot pain    Suspect plantar fasciitis. Referral to podiatry.       Relevant Orders   Ambulatory referral to Podiatry   Routine physical examination - Primary    CBE performed. deffered pelvic exam in the absence of complaints and pap is UTD. Thyroid enlarged, pending . Advised to continue exercise, focus on attaining healthy bmi< 30.       Relevant  Orders   TSH (Completed)   CBC with Differential/Platelet (Completed)   Comprehensive metabolic panel (Completed)   Hemoglobin A1c (Completed)   Lipid panel (Completed)   VITAMIN D 25 Hydroxy (Vit-D Deficiency, Fractures) (Completed)   Microalbumin / creatinine urine ratio (Completed)    Other Visit Diagnoses    Enlarged thyroid       Relevant Orders   THYROID  I have discontinued Twyla D. Stotler's valACYclovir and Cholecalciferol. I am also having her maintain her atorvastatin, lisinopril, metFORMIN, Jardiance, and Trulicity.   No orders of the defined types were placed in this encounter.   Return precautions given.   Risks, benefits, and alternatives of the medications and treatment plan prescribed today were discussed, and patient expressed understanding.   Education regarding symptom management and diagnosis given to patient on AVS.   Continue to follow with Allegra Grana, FNP for routine health maintenance.   Megen D Purifoy and I agreed with plan.   Rennie Plowman, FNP

## 2020-01-12 NOTE — Assessment & Plan Note (Addendum)
CBE performed. deffered pelvic exam in the absence of complaints and pap is UTD. Thyroid enlarged, pending [place]. Advised to continue exercise, focus on attaining healthy bmi< 30.

## 2020-01-12 NOTE — Assessment & Plan Note (Signed)
Suspect plantar fasciitis. Referral to podiatry.

## 2020-01-12 NOTE — Assessment & Plan Note (Addendum)
Stable. Continue metformin 1000mg  BID, trulicity 3mg  qwk, jardiance 10mg 

## 2020-01-12 NOTE — Assessment & Plan Note (Addendum)
Slightly elevated today. Patient will keep BP log and if persistent, she will let me know.continue lisinopril 10mg  qd

## 2020-01-13 ENCOUNTER — Other Ambulatory Visit: Payer: Self-pay | Admitting: Family

## 2020-01-13 DIAGNOSIS — E119 Type 2 diabetes mellitus without complications: Secondary | ICD-10-CM

## 2020-01-13 MED ORDER — EMPAGLIFLOZIN 25 MG PO TABS: 25.0000 mg | ORAL_TABLET | Freq: Every day | ORAL | 1 refills | Status: DC

## 2020-01-18 ENCOUNTER — Ambulatory Visit
Admission: RE | Admit: 2020-01-18 | Discharge: 2020-01-18 | Disposition: A | Discharge status: Home / Self Care | Payer: No Typology Code available for payment source | Source: Ambulatory Visit | Attending: Family | Admitting: Family

## 2020-01-18 ENCOUNTER — Other Ambulatory Visit: Payer: Self-pay

## 2020-01-18 DIAGNOSIS — E049 Nontoxic goiter, unspecified: Secondary | ICD-10-CM | POA: Insufficient documentation

## 2020-01-20 ENCOUNTER — Other Ambulatory Visit: Payer: Self-pay

## 2020-01-20 ENCOUNTER — Ambulatory Visit (INDEPENDENT_AMBULATORY_CARE_PROVIDER_SITE_OTHER): Payer: No Typology Code available for payment source

## 2020-01-20 ENCOUNTER — Ambulatory Visit (INDEPENDENT_AMBULATORY_CARE_PROVIDER_SITE_OTHER): Payer: No Typology Code available for payment source | Admitting: Podiatry

## 2020-01-20 ENCOUNTER — Encounter: Payer: Self-pay | Admitting: Podiatry

## 2020-01-20 DIAGNOSIS — M722 Plantar fascial fibromatosis: Secondary | ICD-10-CM

## 2020-01-20 DIAGNOSIS — M216X1 Other acquired deformities of right foot: Secondary | ICD-10-CM | POA: Diagnosis not present

## 2020-01-20 DIAGNOSIS — M21861 Other specified acquired deformities of right lower leg: Secondary | ICD-10-CM

## 2020-01-20 MED ORDER — MELOXICAM 15 MG PO TABS: 15.0000 mg | ORAL_TABLET | Freq: Every day | ORAL | 3 refills | Status: DC

## 2020-01-20 NOTE — Patient Instructions (Signed)

## 2020-01-20 NOTE — Progress Notes (Signed)
  Subjective:  Patient ID: Cassandra [name], female    DOB: December 11, 1965,  MRN: [id number]  Chief Complaint  Patient presents with  . Foot Pain    Patient presents today for right heel pain x 2-3 months.  She says its a burning pain and feels like she is walking on a bruise    54 y.o. female presents with the above complaint. History confirmed with patient. Worse when standing up from sitting and in AM. She works at [employer] as a Engineer, manufacturing.  Objective:  Physical Exam: warm, good capillary refill, no trophic changes or ulcerative lesions, normal DP and PT pulses and normal sensory exam.    Right Foot: point tenderness over the heel pad   No images are attached to the encounter.  Radiographs: X-ray of the right foot: plantar calcaneal spur and pes planus Assessment:   1. Plantar fasciitis   2. Gastrocnemius equinus of right lower extremity      Plan:  Patient was evaluated and treated and all questions answered.   -XR reviewed with patient -Educated patient on stretching and icing of the affected limb -Plantar fascial brace dispensed -Injection delivered to the plantar fascia of the right foot. -Rx for meloxicam. Educated on use, risks and benefits of the medication  - Advised to check if she has coverage for orthotics, if she does will cast for CMOs at next visit   After sterile prep with povidone-iodine solution and alcohol, the right heel was injected with 0.5cc 2% xylocaine plain, 0.5cc 0.5% marcaine plain, 5mg  triamcinolone acetonide, and 2mg  dexamethasone was injected along the plantar fascia at the insertion on the plantar calcaneus. The patient tolerated the procedure well without complication.   Return in about 4 weeks (around 02/17/2020) for recheck plantar fasciitis.

## 2020-02-17 ENCOUNTER — Encounter: Payer: Self-pay | Admitting: Podiatry

## 2020-02-17 ENCOUNTER — Ambulatory Visit (INDEPENDENT_AMBULATORY_CARE_PROVIDER_SITE_OTHER): Payer: No Typology Code available for payment source | Admitting: Podiatry

## 2020-02-17 ENCOUNTER — Other Ambulatory Visit: Payer: Self-pay | Admitting: Podiatry

## 2020-02-17 ENCOUNTER — Other Ambulatory Visit: Payer: Self-pay

## 2020-02-17 DIAGNOSIS — M216X1 Other acquired deformities of right foot: Secondary | ICD-10-CM | POA: Diagnosis not present

## 2020-02-17 DIAGNOSIS — M722 Plantar fascial fibromatosis: Secondary | ICD-10-CM

## 2020-02-17 DIAGNOSIS — M21861 Other specified acquired deformities of right lower leg: Secondary | ICD-10-CM

## 2020-02-17 MED ORDER — MELOXICAM 15 MG PO TABS
15.0000 mg | ORAL_TABLET | Freq: Every day | ORAL | 3 refills | Status: DC
Start: 2020-02-17 — End: 2020-04-13

## 2020-02-17 NOTE — Progress Notes (Signed)
  Subjective:  Patient ID: Cassandra [name], female    DOB: December 04, 1965,  MRN: [id number]  Chief Complaint  Patient presents with  . Plantar Fasciitis    "It still bothers me.  At work, I put a insole in my shoe and it helps."    54 y.o. female returns with the above complaint. History confirmed with patient.  She has been working third shift.  She got a insert to put in her shoe and this is helped a lot.  She did not start taking meloxicam did not go to the pharmacy.  She does not think the injection did not help after 1 day.  Objective:  Physical Exam: warm, good capillary refill, no trophic changes or ulcerative lesions, normal DP and PT pulses and normal sensory exam.    Right Foot: point tenderness over the heel pad, slightly improved since last visit  No images are attached to the encounter.  Radiographs: X-ray of the right foot: plantar calcaneal spur and pes planus Assessment:   1. Plantar fasciitis   2. Gastrocnemius equinus of right lower extremity      Plan:  Patient was evaluated and treated and all questions answered.   -Continue support with insert OTC -Recommend she starting meloxicam daily for 4 weeks.  She will pick this up at her pharmacy -She preferred to avoid any further injection today -Continue stretching and icing at home  No follow-ups on file.

## 2020-03-14 ENCOUNTER — Other Ambulatory Visit: Payer: Self-pay | Admitting: Family

## 2020-03-14 DIAGNOSIS — E119 Type 2 diabetes mellitus without complications: Secondary | ICD-10-CM

## 2020-03-14 DIAGNOSIS — R197 Diarrhea, unspecified: Secondary | ICD-10-CM

## 2020-04-13 ENCOUNTER — Ambulatory Visit (INDEPENDENT_AMBULATORY_CARE_PROVIDER_SITE_OTHER): Payer: No Typology Code available for payment source | Admitting: Family

## 2020-04-13 ENCOUNTER — Other Ambulatory Visit: Payer: Self-pay

## 2020-04-13 ENCOUNTER — Other Ambulatory Visit: Payer: Self-pay | Admitting: Family

## 2020-04-13 ENCOUNTER — Encounter: Payer: Self-pay | Admitting: Family

## 2020-04-13 VITALS — BP 112/78 | HR 90 | Temp 98.3°F | Ht 66.0 in | Wt 203.8 lb

## 2020-04-13 DIAGNOSIS — H6121 Impacted cerumen, right ear: Secondary | ICD-10-CM

## 2020-04-13 DIAGNOSIS — E785 Hyperlipidemia, unspecified: Secondary | ICD-10-CM

## 2020-04-13 DIAGNOSIS — E119 Type 2 diabetes mellitus without complications: Secondary | ICD-10-CM | POA: Diagnosis not present

## 2020-04-13 DIAGNOSIS — M79671 Pain in right foot: Secondary | ICD-10-CM

## 2020-04-13 DIAGNOSIS — I10 Essential (primary) hypertension: Secondary | ICD-10-CM

## 2020-04-13 MED ORDER — EMPAGLIFLOZIN 10 MG PO TABS: 10.0000 mg | ORAL_TABLET | Freq: Every day | ORAL | Status: DC

## 2020-04-13 MED ORDER — MELOXICAM 7.5 MG PO TABS: 7.5000 mg | ORAL_TABLET | Freq: Every day | ORAL | 0 refills | Status: DC

## 2020-04-13 MED ORDER — OMEPRAZOLE 20 MG PO CPDR: 20.0000 mg | DELAYED_RELEASE_CAPSULE | Freq: Every day | ORAL | 0 refills | Status: DC

## 2020-04-13 MED ORDER — TRULICITY 3 MG/0.5ML ~~LOC~~ SOAJ
SUBCUTANEOUS | 3 refills | Status: DC
Start: 2020-04-13 — End: 2020-04-13

## 2020-04-13 NOTE — Patient Instructions (Signed)
Trial mobic ( anti inflammatory) with omeprazole for ONE MONTH ONLY.  Please ensure you do not skip meals.  You do eat small, frequent meals to sustain sugar  This is  Dr. Melina Schools  example of a  "Low GI"  Diet:  It will allow you to lose 4 to 8  lbs  per month if you follow it carefully.  Your goal with exercise is a minimum of 30 minutes of aerobic exercise 5 days per week (Walking does not count once it becomes easy!)    All of the foods can be found at grocery stores and in bulk at [employer].  The Atkins protein bars and shakes are available in more varieties at Target, WalMart and [employer]'s Foods.     7 AM Breakfast:  Choose from the following:  Low carbohydrate Protein  Shakes (I recommend the  Premier Protein chocolate shakes,  EAS AdvantEdge "Carb Control" shakes  Or the Atkins shakes all are under 3 net carbs)     a scrambled egg/bacon/cheese burrito made with Mission's "carb balance" whole wheat tortilla  (about 10 net carbs )  Medical laboratory scientific officer (basically a quiche without the pastry crust) that is eaten cold and very convenient way to get your eggs.  8 carbs)  If you make your own protein shakes, avoid bananas and pineapple,  And use low carb greek yogurt or original /unsweetened almond or soy milk    Avoid cereal and bananas, oatmeal and cream of wheat and grits. They are loaded with carbohydrates!   10 AM: high protein snack:  Protein bar by Atkins (the snack size, under 200 cal, usually < 6 net carbs).    A stick of cheese:  Around 1 carb,  100 cal     Dannon Light n Fit [place] Yogurt  (80 cal, 8 carbs)  Other so called "protein bars" and Greek yogurts tend to be loaded with carbohydrates.  Remember, in food advertising, the word "energy" is synonymous for " carbohydrate."  Lunch:   A Sandwich using the bread choices listed, Can use any  Eggs,  lunchmeat, grilled meat or canned tuna), avocado, regular mayo/mustard  and cheese.  A Salad using blue cheese,  ranch,  Goddess or vinagrette,  Avoid taco shells, croutons or "confetti" and no "candied nuts" but regular nuts OK.   No pretzels, nabs  or chips.  Pickles and miniature sweet peppers are a good low carb alternative that provide a "crunch"  The bread is the only source of carbohydrate in a sandwich and  can be decreased by trying some of the attached alternatives to traditional loaf bread   Avoid "Low fat dressings, as well as Reyne Dumas and [employer] dressings They are loaded with sugar!   3 PM/ Mid day  Snack:  Consider  1 ounce of  almonds, walnuts, pistachios, pecans, peanuts,  Macadamia nuts or a nut medley.  Avoid "granola and granola bars "  Mixed nuts are ok in moderation as long as there are no raisins,  cranberries or dried fruit.   KIND bars are OK if you get the low glycemic index variety   Try the prosciutto/mozzarella cheese sticks by F[employer]ci  In deli /backery section   High protein      6 PM  Dinner:     Meat/fowl/fish with a green salad, and either broccoli, cauliflower, green beans, spinach, brussel sprouts or  Lima beans. DO NOT BREAD THE PROTEIN!!      There  is a low carb pasta by Dreamfield's that is acceptable and tastes great: only 5 digestible carbs/serving.( All grocery stores but BJs carry it ) Several ready made meals are available low carb:   Try Michel Angelo's chicken piccata or chicken or eggplant parm over low carb pasta.(Lowes and BJs)   Clifton Custard Sanchez's "Carnitas" (pulled pork, no sauce,  0 carbs) or his beef pot roast to make a dinner burrito (at BJ's)  Pesto over low carb pasta (bj's sells a good quality pesto in the center refrigerated section of the deli   Try satueeing  Roosvelt Harps with mushroooms as a good side   [employer] Giant makes a mashed cauliflower that tastes like mashed potatoes  Whole wheat pasta is still full of digestible carbs and  Not as low in glycemic index as Dreamfield's.   Brown rice is still rice,  So skip the rice and noodles if you  eat [place] or [place] (or at least limit to 1/2 cup)  9 PM snack :   Breyer's "low carb" fudgsicle or  ice cream bar (Carb Smart line), or  Weight Watcher's ice cream bar , or another "no sugar added" ice cream;  a serving of fresh berries/cherries with whipped cream   Cheese or DANNON'S LlGHT N FIT GREEK YOGURT  8 ounces of [place] unsweetened almond/cococunut milk    Treat yourself to a parfait made with whipped cream blueberiies, walnuts and vanilla greek yogurt  Avoid bananas, pineapple, grapes  and watermelon on a regular basis because they are high in sugar.  THINK OF THEM AS DESSERT  Remember that snack Substitutions should be less than 10 NET carbs per serving and meals < 20 carbs. Remember to subtract fiber grams to get the "net carbs."

## 2020-04-13 NOTE — Progress Notes (Signed)
Subjective:    Patient ID: Cassandra [name], female    DOB: [birth year], 54 y.o.   MRN: [id number]  CC: Cassandra [name] is a 54 y.o. female who presents today for follow up.   HPI: Continues to have right ventral pain of foot despite cortizone injection with podiatry. She took one dose of mobic however didn't realize that she was supposed to take daily.   She also complains of right ear pain  X 2 weeks.  No fever, hearing loss,  nasal congestion, HA, vision changes, pain with chewing.  muffled hearing. She has occasional tinnitus which have been going for years and unchanged.   Has been using sweet oil without relief.   HTN- compliant with lisinopril 10mg . No cp.   DM- She has hypoglycemic episodes 66,59 and had skipped lunch. This occurred prior to supper.Compliant with jardiance 10mg ,trulicity 3mg , metformin 2000mg  qd.   HISTORY:  Past Medical History:  Diagnosis Date  . Chicken pox   . Diabetes mellitus without complication (HCC)   . Herpes   . Hypertension    Past Surgical History:  Procedure Laterality Date  . CHOLECYSTECTOMY  2015  . HERNIA REPAIR     54 years old  . TUBAL LIGATION  1991   Family History  Problem Relation Age of Onset  . Hypertension Mother   . Diabetes Mother   . Breast cancer Neg Hx   . Thyroid cancer Neg Hx     Allergies: Glipizide Current Outpatient Medications on File Prior to Visit  Medication Sig Dispense Refill  . atorvastatin (LIPITOR) 20 MG tablet TAKE 1 TABLET BY MOUTH DAILY. 90 tablet 3  . lisinopril (ZESTRIL) 10 MG tablet TAKE 1 TABLET BY MOUTH DAILY. 90 tablet 3  . metFORMIN (GLUCOPHAGE-XR) 500 MG 24 hr tablet TAKE 2 TABLETS (1,000 MG) BY MOUTH 2 TIMES DAILY WITH A MEAL. 360 tablet 1   No current facility-administered medications on file prior to visit.    Social History   Tobacco Use  . Smoking status: Never Smoker  . Smokeless tobacco: Never Used  Vaping Use  . Vaping Use: Never used  Substance Use Topics  .  Alcohol use: No    Alcohol/week: 0.0 standard drinks  . Drug use: No    Review of Systems  Constitutional: Negative for chills and fever.  HENT: Positive for ear pain and hearing loss (muffled). Negative for ear discharge, sinus pain and sore throat.   Respiratory: Negative for cough.   Cardiovascular: Negative for chest pain and palpitations.  Gastrointestinal: Negative for nausea and vomiting.      Objective:    BP 112/78   Pulse 90   Temp 98.3 F (36.8 C)   Ht 5\' 6"  (1.676 m)   Wt 203 lb 12.8 oz (92.4 kg)   SpO2 97%   BMI 32.89 kg/m  BP Readings from Last 3 Encounters:  04/13/20 112/78  01/12/20 138/78  02/13/19 118/64   Wt Readings from Last 3 Encounters:  04/13/20 203 lb 12.8 oz (92.4 kg)  01/12/20 201 lb 12.8 oz (91.5 kg)  06/05/19 197 lb (89.4 kg)    Physical Exam Vitals reviewed.  Constitutional:      Appearance: She is well-developed and well-nourished.  HENT:     Head:     Comments: Bilateral cerumen impactions, resolved with irrigation by CMA in both left and right ear.  Patient tolerated procedure well.  Eyes:     Conjunctiva/sclera: Conjunctivae normal.  Cardiovascular:  Rate and Rhythm: Normal rate and regular rhythm.     Pulses: Normal pulses.     Heart sounds: Normal heart sounds.  Pulmonary:     Effort: Pulmonary effort is normal.     Breath sounds: Normal breath sounds. No wheezing, rhonchi or rales.  Skin:    General: Skin is warm and dry.  Neurological:     Mental Status: She is alert.  Psychiatric:        Mood and Affect: Mood and affect normal.        Speech: Speech normal.        Behavior: Behavior normal.        Thought Content: Thought content normal.        Assessment & Plan:   Problem List Items Addressed This Visit      Cardiovascular and Mediastinum   Benign essential HTN    Controlled. Continue lisinopril 10mg         Endocrine   Diabetes mellitus type 2, controlled, without complications (HCC)    Lab Results   Component Value Date   HGBA1C 7.0 (H) 04/13/2020   . Advised not to skip meals and to eat small, frequent meals with protein. Diet also printed with patient. In setting of hypoglycemic episodes, I think more prudent for nutrition consult for increased education prior to increasing jardiance to 25mg . Offered patient referral with lab result, awaiting response.  Continue jardiance 10mg ,trulicity 3mg , metformin 2000mg  qd.      Relevant Medications   Dulaglutide (TRULICITY) 3 MG/0.5ML SOPN   empagliflozin (JARDIANCE) 10 MG TABS tablet   Other Relevant Orders   Basic metabolic panel (Completed)   Hemoglobin A1c (Completed)     Nervous and Auditory   Right ear impacted cerumen    Resolved.         Other   Hyperlipidemia    Controlled. Continue lipitor 20mg       Right foot pain - Primary    Uncontrolled. Advised mobic for 4 weeks only to see if plantar fasciitis improves.She will take mobic with prilosec. Advised icing, supportive shoes. Close follow up.       Relevant Medications   meloxicam (MOBIC) 7.5 MG tablet   omeprazole (PRILOSEC) 20 MG capsule       I have discontinued Cassandra [name]'s meloxicam. I have also changed her empagliflozin. Additionally, I am having her start on meloxicam and omeprazole. Lastly, I am having her maintain her atorvastatin, lisinopril, metFORMIN, and Trulicity.   Meds ordered this encounter  Medications  . meloxicam (MOBIC) 7.5 MG tablet    Sig: Take 1 tablet (7.5 mg total) by mouth daily.    Dispense:  30 tablet    Refill:  0    Order Specific Question:   Supervising Provider    Answer:   04/15/2020 L [[id number]]  . omeprazole (PRILOSEC) 20 MG capsule    Sig: Take 1 capsule (20 mg total) by mouth daily.    Dispense:  30 capsule    Refill:  0    Order Specific Question:   Supervising Provider    Answer:   L [[id number]]  . Dulaglutide (TRULICITY) 3 MG/0.5ML SOPN    Sig: INJECT 3 MG INTO THE SKIN ONCE A WEEK.    Dispense:  2  mL    Refill:  3    Order Specific Question:   Supervising Provider    Answer:   , TERESA L [[id number]]  . empagliflozin (JARDIANCE) 10 MG TABS tablet  Sig: Take 1 tablet (10 mg total) by mouth daily before breakfast.    Dispense:  90 tablet    Order Specific Question:   Supervising Provider    Answer:   Cassandra [name] [[id number]]    Return precautions given.   Risks, benefits, and alternatives of the medications and treatment plan prescribed today were discussed, and patient expressed understanding.   Education regarding symptom management and diagnosis given to patient on AVS.  Continue to follow with Allegra Grana, FNP for routine health maintenance.   Dayne D Burroughs and I agreed with plan.   Rennie Plowman, FNP

## 2020-04-13 NOTE — Assessment & Plan Note (Addendum)
Lab Results  Component Value Date   HGBA1C 7.0 (H) 04/13/2020   . Advised not to skip meals and to eat small, frequent meals with protein. Diet also printed with patient. In setting of hypoglycemic episodes, I think more prudent for nutrition consult for increased education prior to increasing jardiance to 25mg . Offered patient referral with lab result, awaiting response.  Continue jardiance 10mg ,trulicity 3mg , metformin 2000mg  qd.

## 2020-04-14 LAB — BASIC METABOLIC PANEL
BUN: 14 mg/dL (ref 6–23)
CO2: 28 mEq/L (ref 19–32)
Calcium: 9.4 mg/dL (ref 8.4–10.5)
Chloride: 104 mEq/L (ref 96–112)
Creatinine, Ser: 0.64 mg/dL (ref 0.40–1.20)
GFR: 100.87 mL/min (ref >60.00)
Glucose, Bld: 98 mg/dL (ref 70–99)
Potassium: 3.7 mEq/L (ref 3.5–5.1)
Sodium: 137 mEq/L (ref 135–145)

## 2020-04-14 LAB — HEMOGLOBIN A1C: Hgb A1c MFr Bld: 7 % — ABNORMAL HIGH (ref 4.6–6.5)

## 2020-04-17 DIAGNOSIS — H6121 Impacted cerumen, right ear: Secondary | ICD-10-CM | POA: Insufficient documentation

## 2020-04-17 NOTE — Assessment & Plan Note (Addendum)
Uncontrolled. Advised mobic for 4 weeks only to see if plantar fasciitis improves.She will take mobic with prilosec. Advised icing, supportive shoes. Close follow up.

## 2020-04-17 NOTE — Assessment & Plan Note (Signed)
Controlled. Continue lisinopril 10mg 

## 2020-04-17 NOTE — Assessment & Plan Note (Signed)
Resolved

## 2020-04-17 NOTE — Assessment & Plan Note (Signed)
Controlled. Continue lipitor 20mg 

## 2020-05-02 ENCOUNTER — Encounter: Payer: Self-pay | Admitting: Emergency Medicine

## 2020-05-02 ENCOUNTER — Emergency Department
Admission: EM | Admit: 2020-05-02 | Discharge: 2020-05-02 | Disposition: A | Discharge status: Home / Self Care | Payer: PRIVATE HEALTH INSURANCE | Attending: Emergency Medicine | Admitting: Emergency Medicine

## 2020-05-02 ENCOUNTER — Emergency Department: Payer: PRIVATE HEALTH INSURANCE

## 2020-05-02 ENCOUNTER — Other Ambulatory Visit: Payer: Self-pay

## 2020-05-02 DIAGNOSIS — I10 Essential (primary) hypertension: Secondary | ICD-10-CM | POA: Insufficient documentation

## 2020-05-02 DIAGNOSIS — E1169 Type 2 diabetes mellitus with other specified complication: Secondary | ICD-10-CM | POA: Insufficient documentation

## 2020-05-02 DIAGNOSIS — E785 Hyperlipidemia, unspecified: Secondary | ICD-10-CM | POA: Insufficient documentation

## 2020-05-02 DIAGNOSIS — S99911A Unspecified injury of right ankle, initial encounter: Secondary | ICD-10-CM | POA: Diagnosis present

## 2020-05-02 DIAGNOSIS — W000XXA Fall on same level due to ice and snow, initial encounter: Secondary | ICD-10-CM | POA: Diagnosis not present

## 2020-05-02 DIAGNOSIS — S93401A Sprain of unspecified ligament of right ankle, initial encounter: Secondary | ICD-10-CM | POA: Insufficient documentation

## 2020-05-02 DIAGNOSIS — Z79899 Other long term (current) drug therapy: Secondary | ICD-10-CM | POA: Diagnosis not present

## 2020-05-02 DIAGNOSIS — Z7984 Long term (current) use of oral hypoglycemic drugs: Secondary | ICD-10-CM | POA: Insufficient documentation

## 2020-05-02 DIAGNOSIS — Y990 Civilian activity done for income or pay: Secondary | ICD-10-CM | POA: Diagnosis not present

## 2020-05-02 NOTE — ED Provider Notes (Signed)
[facility] Emergency Department Provider Note   ____________________________________________   Event Date/Time   First MD Initiated Contact with Patient 05/02/20 0813     (approximate)  I have reviewed the triage vital signs and the nursing notes.   HISTORY  Chief Complaint Ankle Pain   HPI Cassandra [name] is a 55 y.o. female presents to the ED with complaint of right ankle pain.  Patient states that she came to work and got out of her car when she slipped on ice and snow.  She has continued to have ankle pain since that time.  She denies any head injury or loss of consciousness.  She rates her pain as 5 out of 10.       Past Medical History:  Diagnosis Date  . Chicken pox   . Diabetes mellitus without complication (HCC)   . Herpes   . Hypertension     Patient Active Problem List   Diagnosis Date Noted  . Right ear impacted cerumen 04/17/2020  . Right foot pain 01/12/2020  . Hyperlipidemia 05/05/2018  . Menorrhagia with regular cycle 12/25/2017  . Suprapubic pain 12/15/2015  . GERD (gastroesophageal reflux disease) 11/01/2015  . Diarrhea 05/19/2015  . Routine physical examination 02/20/2015  . Diabetes mellitus type 2, controlled, without complications (HCC) 02/20/2015  . Benign essential HTN 02/20/2015  . Herpes simplex 02/20/2015    Past Surgical History:  Procedure Laterality Date  . CHOLECYSTECTOMY  2015  . HERNIA REPAIR     55 years old  . TUBAL LIGATION  1991    Prior to Admission medications   Medication Sig Start Date End Date Taking? Authorizing Provider  atorvastatin (LIPITOR) 20 MG tablet TAKE 1 TABLET BY MOUTH DAILY. 06/24/19   Allegra Grana, FNP  Dulaglutide (TRULICITY) 3 MG/0.5ML SOPN INJECT 3 MG INTO THE SKIN ONCE A WEEK. 04/13/20   Allegra Grana, FNP  empagliflozin (JARDIANCE) 10 MG TABS tablet Take 1 tablet (10 mg total) by mouth daily before breakfast. 04/13/20   Allegra Grana, FNP  lisinopril  (ZESTRIL) 10 MG tablet TAKE 1 TABLET BY MOUTH DAILY. 06/24/19   Allegra Grana, FNP  meloxicam (MOBIC) 7.5 MG tablet Take 1 tablet (7.5 mg total) by mouth daily. 04/13/20   Allegra Grana, FNP  metFORMIN (GLUCOPHAGE-XR) 500 MG 24 hr tablet TAKE 2 TABLETS (1,000 MG) BY MOUTH 2 TIMES DAILY WITH A MEAL. 03/14/20   Allegra Grana, FNP  omeprazole (PRILOSEC) 20 MG capsule Take 1 capsule (20 mg total) by mouth daily. 04/13/20   Allegra Grana, FNP    Allergies Glipizide  Family History  Problem Relation Age of Onset  . Hypertension Mother   . Diabetes Mother   . Breast cancer Neg Hx   . Thyroid cancer Neg Hx     Social History Social History   Tobacco Use  . Smoking status: Never Smoker  . Smokeless tobacco: Never Used  Vaping Use  . Vaping Use: Never used  Substance Use Topics  . Alcohol use: No    Alcohol/week: 0.0 standard drinks  . Drug use: No    Review of Systems Constitutional: No fever/chills Eyes: No visual changes. ENT: No trauma. Cardiovascular: Denies chest pain. Respiratory: Denies shortness of breath. Gastrointestinal: No abdominal pain.  No nausea, no vomiting.  Genitourinary: Negative for dysuria. Musculoskeletal: Positive for right ankle pain. Skin: Negative for rash.  Negative for skin injury. Neurological: Negative for headaches, focal weakness or numbness. ____________________________________________  PHYSICAL EXAM:  VITAL SIGNS: ED Triage Vitals  Enc Vitals Group     BP 05/02/20 0754 (!) 146/71     Pulse Rate 05/02/20 0754 87     Resp 05/02/20 0754 20     Temp 05/02/20 0754 98.4 F (36.9 C)     Temp Source 05/02/20 0754 Oral     SpO2 05/02/20 0754 100 %     Weight 05/02/20 0752 204 lb (92.5 kg)     Height 05/02/20 0752 5\' 7"  (1.702 m)     Head Circumference --      Peak Flow --      Pain Score 05/02/20 0752 5     Pain Loc --      Pain Edu? --      Excl. in GC? --     Constitutional: Alert and oriented. Well appearing  and in no acute distress. Eyes: Conjunctivae are normal.  Head: Atraumatic. Nose: No trauma. Neck: No stridor.   Cardiovascular: Normal rate, regular rhythm. Grossly normal heart sounds.  Good peripheral circulation. Respiratory: Normal respiratory effort.  No retractions. Lungs CTAB. Gastrointestinal: Soft and nontender. No distention.  Musculoskeletal: Moderate tenderness on palpation of the lateral malleolus and soft tissue surrounding this area.  Range of motion is slightly restricted secondary to discomfort.  Skin is intact without discoloration.  Pulses present.  Motor and sensory function intact distally to the injury.  No injury noted proximal to the ankle area. Neurologic:  Normal speech and language. No gross focal neurologic deficits are appreciated.  Skin:  Skin is warm, dry and intact.  No abrasions or ecchymosis. Psychiatric: Mood and affect are normal. Speech and behavior are normal.  ____________________________________________   LABS (all labs ordered are listed, but only abnormal results are displayed)  Labs Reviewed - No data to display ____________________________________________  RADIOLOGY I, 05/04/20, personally viewed and evaluated these images (plain radiographs) as part of my medical decision making, as well as reviewing the written report by the radiologist.   Official radiology report(s): DG Ankle Complete Right  Result Date: 05/02/2020 CLINICAL DATA:  55 year old female status post fall with lateral pain. Unable to weightbear. EXAM: RIGHT ANKLE - COMPLETE 3+ VIEW COMPARISON:  Right foot series 01/20/2020. FINDINGS: Bone mineralization is within normal limits. There is no evidence of fracture, dislocation, or joint effusion. There is no evidence of arthropathy or other focal bone abnormality. No discrete soft tissue injury. IMPRESSION: Negative. Electronically Signed   By: 03/21/2020 M.D.   On: 05/02/2020 08:26     ____________________________________________   PROCEDURES  Procedure(s) performed (including Critical Care):  Procedures  Aircast/ankle stirrup splint was applied by provider. ____________________________________________   INITIAL IMPRESSION / ASSESSMENT AND PLAN / ED COURSE  As part of my medical decision making, I reviewed the following data within the electronic MEDICAL RECORD NUMBER Notes from prior ED visits and [place] Controlled Substance Database  55 year old female presents to the ED with a Workmen's Comp. injury.  Patient states that she got out of her car to work today and slipped in the snow and ice hurting her right ankle.  No head injury or loss of consciousness and patient denies any other injury other than her ankle.  Physical exam shows tenderness to the lateral malleolus right ankle.  No deformity or skin discoloration noted.  X-rays were negative.  Patient was made aware.  An Aircast/ankle brace was applied to the patient's ankle by provider.  Patient was given instructions to ice and  elevate.  Tylenol or ibuprofen as needed for pain.  She is to follow-up with Dr. Signa Kell if any continued problems with her ankle.   ____________________________________________   FINAL CLINICAL IMPRESSION(S) / ED DIAGNOSES  Final diagnoses:  Sprain of right ankle, unspecified ligament, initial encounter     ED Discharge Orders    None      *Please note:  Shellee D Weygandt was evaluated in Emergency Department on 05/02/2020 for the symptoms described in the history of present illness. She was evaluated in the context of the global COVID-19 pandemic, which necessitated consideration that the patient might be at risk for infection with the SARS-CoV-2 virus that causes COVID-19. Institutional protocols and algorithms that pertain to the evaluation of patients at risk for COVID-19 are in a state of rapid change based on information released by regulatory bodies including the CDC and  federal and state organizations. These policies and algorithms were followed during the patient's care in the ED.  Some ED evaluations and interventions may be delayed as a result of limited staffing during and the pandemic.*   Note:  This document was prepared using Dragon voice recognition software and may include unintentional dictation errors.    Tommi Rumps, PA-C 05/02/20 1447    Minna Antis, MD 05/02/20 1501

## 2020-05-02 NOTE — Discharge Instructions (Signed)
Follow-up with your primary care provider if any continued problems or concerns.  Also if any worsening or not improving you will need to follow-up with Dr. Signa Kell who is on-call for orthopedics at [facility].  He has contact information is listed on your discharge papers.  Wear the ankle brace for added support and protection.  Ice and elevate today to reduce swelling and help with pain.  Ibuprofen can be taken every 4-6 hours as needed for pain.  Be sure and eat before taking this medication.  This will help with inflammation.  Tylenol is strictly for pain control.

## 2020-05-02 NOTE — ED Triage Notes (Signed)
Pt reports came to work got out of the car and slipped on the snow and ice hurting her right ankle.

## 2020-05-04 ENCOUNTER — Ambulatory Visit: Payer: No Typology Code available for payment source | Admitting: Podiatry

## 2020-05-12 ENCOUNTER — Other Ambulatory Visit: Payer: Self-pay | Admitting: Physician Assistant

## 2020-05-24 ENCOUNTER — Ambulatory Visit: Payer: No Typology Code available for payment source | Attending: Internal Medicine

## 2020-05-24 ENCOUNTER — Other Ambulatory Visit: Payer: Self-pay | Admitting: Internal Medicine

## 2020-05-24 DIAGNOSIS — Z23 Encounter for immunization: Secondary | ICD-10-CM

## 2020-05-24 NOTE — Progress Notes (Signed)
   Covid-19 Vaccination Clinic  Name:  ADRIONNA DELCID    MRN: [id number] DOB: 12/18/1965  05/24/2020  Ms. Bachand was observed post Covid-19 immunization for 15 minutes without incident. She was provided with Vaccine Information Sheet and instruction to access the V-Safe system.   Ms. Shinall was instructed to call 911 with any severe reactions post vaccine: Marland Kitchen Difficulty breathing  . Swelling of face and throat  . A fast heartbeat  . A bad rash all over body  . Dizziness and weakness   Immunizations Administered    Name Date Dose VIS Date Route   PFIZER Comrnaty(Gray TOP) Covid-19 Vaccine 05/24/2020  2:03 PM 0.3 mL 03/24/2020 Intramuscular   Manufacturer: [employer], [employer]   Lot: [id number]   NDC: [phone]

## 2020-06-21 ENCOUNTER — Other Ambulatory Visit: Payer: Self-pay | Admitting: Orthopaedic Surgery

## 2020-06-21 DIAGNOSIS — M25571 Pain in right ankle and joints of right foot: Secondary | ICD-10-CM

## 2020-06-22 ENCOUNTER — Other Ambulatory Visit: Payer: Self-pay | Admitting: Family

## 2020-06-22 DIAGNOSIS — E119 Type 2 diabetes mellitus without complications: Secondary | ICD-10-CM

## 2020-06-25 ENCOUNTER — Ambulatory Visit
Admission: RE | Admit: 2020-06-25 | Discharge: 2020-06-25 | Disposition: A | Discharge status: Home / Self Care | Payer: PRIVATE HEALTH INSURANCE | Source: Ambulatory Visit | Attending: Orthopaedic Surgery | Admitting: Orthopaedic Surgery

## 2020-06-25 ENCOUNTER — Other Ambulatory Visit: Payer: Self-pay

## 2020-06-25 DIAGNOSIS — M25571 Pain in right ankle and joints of right foot: Secondary | ICD-10-CM | POA: Insufficient documentation

## 2020-06-27 ENCOUNTER — Other Ambulatory Visit: Payer: Self-pay | Admitting: Orthopaedic Surgery

## 2020-07-19 ENCOUNTER — Other Ambulatory Visit: Payer: Self-pay

## 2020-07-21 ENCOUNTER — Other Ambulatory Visit: Payer: Self-pay

## 2020-07-21 MED FILL — Lisinopril Tab 10 MG: ORAL | 90 days supply | Qty: 90 | Fill #0 | Status: AC

## 2020-08-01 ENCOUNTER — Other Ambulatory Visit: Payer: Self-pay

## 2020-08-01 ENCOUNTER — Encounter: Payer: Self-pay | Admitting: Physical Therapy

## 2020-08-01 ENCOUNTER — Ambulatory Visit: Payer: PRIVATE HEALTH INSURANCE | Attending: Orthopaedic Surgery | Admitting: Physical Therapy

## 2020-08-01 DIAGNOSIS — M25671 Stiffness of right ankle, not elsewhere classified: Secondary | ICD-10-CM | POA: Diagnosis present

## 2020-08-01 DIAGNOSIS — M25571 Pain in right ankle and joints of right foot: Secondary | ICD-10-CM | POA: Insufficient documentation

## 2020-08-01 DIAGNOSIS — M6281 Muscle weakness (generalized): Secondary | ICD-10-CM | POA: Diagnosis present

## 2020-08-01 NOTE — Therapy (Signed)
[facility] PHYSICAL AND SPOR[facility] MEDICINE [address] [place], [place], [address] Phone: [phone]   Fax:  [phone]  Physical Therapy Evaluation  Patient Details  Name: Cassandra [name] MRN: [id number] Date of Birth: [birth year] Referring Provider (PT): Nicki Guadalajarahris Adair, MD   Encounter Date: 08/01/2020   PT End of Session - 08/01/20 1943    Visit Number 1    Number of Visits 24    Date for PT Re-Evaluation 10/24/20    Authorization Type [facility] (Work Comp) reporting period from 08/01/2020    Authorization Time Period Worker's Comp Quarry managerauth by Venida JarvisPatty Harper, RN. Evaluation plus 8 visits. [facility] [id number]    Authorization - Visit Number 1    Authorization - Number of Visits 9    Progress Note Due on Visit 9    PT Start Time 1735    PT Stop Time 1820    PT Time Calculation (min) 45 min    Activity Tolerance Patient tolerated treatment well    Behavior During Therapy WFL for tasks assessed/performed           Past Medical History:  Diagnosis Date  . Chicken pox   . Diabetes mellitus without complication (HCC)   . Herpes   . Hypertension     Past Surgical History:  Procedure Laterality Date  . CHOLECYSTECTOMY  2015  . HERNIA REPAIR     55 years old  . TUBAL LIGATION  1991    There were no vitals filed for this visit.    Subjective Assessment - 08/01/20 1743    Subjective Patient states condition started when she slipped and fell on some ice in the parking lot at work on May 02, 2020. She injured her R ankle and now it keeps swelling a lot and it burns. Also has some aching pain but that does not seem to be a major issue. It doesn't swell or burn as much if she doesn't stand on it for a long time. 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 She is currently on light duty (supposed to sit down 10-15 min every hour). She wears a brace that she brought in her pocket book (lace up  stability brace with crossing non-flexible straps). She keeps the brace on at work and when she knows she is going to be up a lot. She doesn't wear it in the house because she usually has her foot propped up. She took medication for swelling/inflammation (meloxicam). She thinks it may have had some effect but ultimately did not work due to standing a lot. She states her ankle has stayed the same since it started. She ices it when it gets really swollen and regularly elevates it. She was given gabapentin, but that did nothing. She has not had any further treatment than what is mentioned above. She already wears compression socks prior to injury. Does have diabetes and current A1C is 7.1. Denies prior injury to the R ankle/foot except a superficial laceration that needed stitches in the medial aspect of her foot in childhood.  She had an MRI and she is unsure what the results mean. She was told her ankle will heal but it will take a while, for example 3-4 months but it is going on 4 months now. 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 She is moving back to 1st  shift role this week.These moves were unrelated to her injury per patient. Denies back pain.    Pertinent History Patient is a 55 y.o. female who presents to outpatient physical therapy with a referral for medical diagnosis right ankle pain. This patient's chief complaints consist of work related injury 05/02/2020 leading to R ankle pain and swelling leading to the following functional deficits: difficulty with usual activities such as working (currenlty on light duty due to injury), prolonged weight bearing (walking/standing), stairs, walking, housework, sleeping, community activities that require standing such as shopping, church activities. Relevant past medical history and comorbidities include diabetes, high blood pressure, GERD, cholecystectomy, hernia repair (no longer  bothers her). Patient denies hx of cancer, stroke, seizures, lung problem, major cardiac events, unexplained weight loss, changes in bowel or bladder problems, new onset stumbling or dropping things.    Limitations Standing;Walking;House hold activities;Other (comment)   difficulty with usual activities such as working (currenlty on light duty due to injury), prolonged weight bearing (walking/standing), stairs, walking, housework, sleeping, community activities that require standing such as shopping, church activities.   Diagnostic tests MRI report of R ankle 06/26/2020: "IMPRESSION:  1. Bone marrow edema in the posterior lateral tibial plafond  adjacent talus with adjacent soft tissue edema which may reflect  osseous contusion secondary to direct trauma versus secondary to  posterior ankle impingement syndrome.  2. Anterior tibiofibular ligament is thickened and irregular  consistent with prior injury."    Patient Stated Goals get better    Currently in Pain? Yes    Pain Score 4    W (burning): 7/10;  B 3-4/10 (achy)   Pain Location Ankle    Pain Orientation Right   surrounding R lateral malleolus, buring at the anterior portion   Pain Descriptors / Indicators Aching;Burning    Pain Type Chronic pain    Pain Radiating Towards denies numbness/tingling    Pain Onset More than a month ago    Pain Frequency Constant    Aggravating Factors  prolonged weight bearing    Pain Relieving Factors ice, elevation, rest, brace    Effect of Pain on Daily Activities Functional Limitations: difficulty with usual activities such as working (currenlty on light duty due to injury), prolonged weight bearing (walking/standing), stairs, walking, housework, sleeping, community activities that require standing such as shopping, church activities.              [facility] PT Assessment - 08/01/20 0001      Assessment   Medical Diagnosis right ankle pain    Referring Provider (PT) Nicki Guadalajara, MD    Onset Date/Surgical  Date 04/29/20    Hand Dominance Right    Next MD Visit 6 weeks    Prior Therapy none for current condition prior to this episode of care      Precautions   Precautions Other (comment)   10-15 min seated break every hour at work   Required Braces or Orthoses --   lace up, crossing strap stability brace     Restrictions   Weight Bearing Restrictions No      Balance Screen   Has the patient fallen in the past 6 months Yes    How many times? 1   injury on ice   Has the patient had a decrease in activity level because of a fear of falling?  Yes    Is the patient reluctant to leave their home because of a fear of falling?  Yes      Home  Environment   Living Environment --   no concerns about getting around home environment safey     Prior Function   Level of Independence Independent    Vocation Full time employment    Vocation Requirements prolonged walking, standing, lifting, office work, Tree surgeon, (dependes on shift).    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 Increased edema surrounding R lateral malleoulus, anterior > posterior. Holds ankle in slight inversion compared to left.  . Tremor: none . Muscle bulk: no gross abnormalities or assymetry noted.  . Skin: appears WFL . Bed mobility: supine <> sit WFL . Transfers: sit <> stand I with mild guarding R LE (off weighting that side).  . Gait: mild antalgic gait favoring R LE . Stairs: not tested  ANTHROPOMETRIC Figure 8:  R = 56 cm, L = 54 cm   PERIPHERAL JOINT MOTION (in degrees) Active Range of Motion (AROM) *Indicates pain 08/01/20 Date Date  Joint/Motion R/L R/L R/L  Ankle/Foot     Dorsiflexion (knee ext) 5/5 / /  Plantarflexion 30*/45 / /  Everison 0*/20 / /  Inversion 25/40 / /   Passive Range of Motion (PROM) *Indicates pain 08/01/20 Date Date  Joint/Motion R/L R/L R/L   Ankle/Foot     Dorsiflexion (knee ext) 15/15 / /  Great toe extension 90/90 / /   MUSCLE PERFORMANCE (MMT):  *Indicates pain 08/01/20 Date Date  Joint/Motion R/L R/L R/L  Ankle/Foot     Dorsiflexion  5*/5 / /  Great toe extension 4+/4+ / /  Eversion 2*/4+ / /  Plantarflexion 4*/4 / /  Inversion 4*/4+ / /  Pronation 4-*/4+ / /  Great toe flexion 4/5 / /   SPECIAL TESTS: Anterior drawer positive on R for pain at posterior lateral malleolous.   ACCESSORY MOTION:  - No excessive motion noted at talocrual or subtalar joint - Painful to distraction of talocrual and subtalar joint.   PALPATION: - TTP at right anterior, distal, and posterior malleoulus, most at posterior region where pressing caused pain at anterior malleolous.   EDUCATION/COGNITION: Patient is alert and oriented X 4.   Objective measurements completed on examination: See above findings.     TREATMENT:  Therapeutic exercise: to centralize symptoms and improve ROM, strength, muscular endurance, and activity tolerance required for successful completion of functional activities.  - seated alphabet - Education on diagnosis, prognosis, POC, anatomy and physiology of current condition.    Pt required multimodal cuing for proper technique and to facilitate improved neuromuscular control, strength, range of motion, and functional ability resulting in improved performance and form.  HOME EXERCISE PROGRAM  Access Code: [email or web address] URL: [email or web address] Date: 08/01/2020 Prepared by: Norton Blizzard  Exercises Seated Ankle Alphabet - 1-2 x daily     PT Education - 08/01/20 1943    Education Details Exercise purpose/form. Self management techniques. Education on diagnosis, prognosis, POC, anatomy and physiology of current condition Education on HEP including handout    Person(s) Educated Patient    Methods Explanation;Demonstration;Tactile cues;Verbal cues;Handout    Comprehension Verbalized  understanding;Returned demonstration;Verbal cues required;Tactile cues required;Need further instruction            PT Short Term Goals - 08/01/20 1945      PT SHORT TERM GOAL #1   Title Be independent with initial home exercise program  for self-management of symptoms.    Baseline Initial HEP provided at IE (08/01/2020);    Time 2    Period Weeks    Status New    Target Date 08/15/20             PT Long Term Goals - 08/01/20 1946      PT LONG TERM GOAL #1   Title Be independent with a long-term home exercise program for self-management of symptoms.    Baseline initial HEP provided at IE (08/01/2020);    Time 12    Period Weeks    Status New   TARGET DATE FOR ALL LONG TERM GOALS: 10/24/2020     PT LONG TERM GOAL #2   Title Demonstrate improved FOTO score equal or greater than 66 by visit # 14 to demonstrate improvement in overall condition and self-reported functional ability.    Baseline 60 (08/01/2020);    Time 12    Period Weeks    Status New      PT LONG TERM GOAL #3   Title Patient will demonstrate R ankle A/PROM equal or greater than L ankle A/PROM with no increase in pain to improve abliity to complete work activities without pain or restrictions.    Baseline R ankle lacking and painful - see objective exam (08/01/2020);    Time 12    Period Weeks    Status New      PT LONG TERM GOAL #4   Title Improve R ankle strength equal or greater to L ankle strength without increased pain to improve abliity to complete work activities without pain or restrictions.    Baseline painful and weak - see objective exam (08/01/2020);    Time 12    Period Weeks    Status New      PT LONG TERM GOAL #5   Title Complete community, work and/or recreational activities without limitation due to current condition    Baseline Functional Limitations: difficulty with usual activities such as working (currenlty on light duty due to injury), prolonged weight bearing (walking/standing), stairs,  walking, housework, sleeping, community activities that require standing such as shopping, church activities (08/01/2020);    Time 12    Period Weeks    Status New                  Plan - 08/01/20 2002    Clinical Impression Statement Patient is a 55 y.o. female referred to outpatient physical therapy with a medical diagnosis of right ankle pain who presents with signs and symptoms consistent with sub-acute right ankle pain following fall in work parking lot on 05/02/2020. Examination reveals tenderness at the lateral R ankle, with sensitization to pain decreasing strength and ROM consistent with lateral ankle sprain. Imaging report suggests possible bone bruise and evidence of prior injury to ATFL which may be prolonging recovery time vs a simple ankle sprain. Diabetes may also prolong recovery time, especially in limb in dependent position and far from the heart. Patient presents with significant pain, ROM, joint stiffness, proprioception, edema, fear of falling, muscle performance (strength/power/endurance) and activity tolerance impairments that are limiting ability to complete usual activities such as working (currenlty on light duty due to injury), prolonged weight bearing (walking/standing), stairs, walking, housework, sleeping, community activities that require standing such as shopping, church activities without difficulty. Patient will benefit from skilled physical therapy intervention to address current body structure impairments and activity limitations to improve function and work towards goals set in current POC  in order to return to prior level of function or maximal functional improvement.    Personal Factors and Comorbidities Age;Comorbidity 3+    Comorbidities diabetes, high blood pressure, GERD, cholecystectomy    Examination-Activity Limitations Stairs;Stand;Locomotion Level;[facility] [employer]    Examination-Participation Restrictions Interpersonal  Relationship;Occupation;Yard Work;Community Activity;Volunteer;Shop;Cleaning   difficulty with usual activities such as working (currenlty on [employer] duty due to injury), prolonged weight bearing (walking/standing), stairs, walking, housework, sleeping, community activities that require standing such as shopping, church activities.   Stability/Clinical Decision Making Evolving/Moderate complexity    Clinical Decision Making Moderate    Rehab Potential Good    PT Frequency 2x / week    PT Duration 12 weeks   as needed   PT Treatment/Interventions ADLs/Self Care Home Management;Aquatic Therapy;Cryotherapy;Moist Heat;Electrical Stimulation;Gait training;Stair training;Functional mobility training;Therapeutic activities;Therapeutic exercise;Balance training;Neuromuscular re-education;Manual techniques;Dry needling;Passive range of motion;Spinal Manipulations;Joint Manipulations;Patient/family education    PT Next Visit Plan update HEP, tolerated ankle strengthening, proprioception    PT Home Exercise Plan Medbridge  Access Code: [email or web address]    Consulted and Agree with Plan of Care Patient           Patient will benefit from skilled therapeutic intervention in order to improve the following deficits and impairments:  Abnormal gait,Improper body mechanics,Pain,Decreased coordination,Decreased mobility,Decreased activity tolerance,Decreased endurance,Decreased range of motion,Decreased strength,Hypomobility,Impaired perceived functional ability,Impaired flexibility,Increased edema,Difficulty walking,Decreased balance  Visit Diagnosis: Right ankle pain, unspecified chronicity  Muscle weakness (generalized)  Stiffness of right ankle, not elsewhere classified     Problem List Patient Active Problem List   Diagnosis Date Noted  . Right ear impacted cerumen 04/17/2020  . Right foot pain 01/12/2020  . Hyperlipidemia 05/05/2018  . Menorrhagia with regular cycle 12/25/2017  . Suprapubic pain 12/15/2015   . GERD (gastroesophageal reflux disease) 11/01/2015  . Diarrhea 05/19/2015  . Routine physical examination 02/20/2015  . Diabetes mellitus type 2, controlled, without complications (HCC) 02/20/2015  . Benign essential HTN 02/20/2015  . Herpes simplex 02/20/2015   Luretha Murphy. Ilsa Iha, PT, DPT 08/01/20, 8:05 PM  [facility] PHYSICAL AND SPORTS MEDICINE [address], [place], [address] Phone: [phone]   Fax:  [phone]  Name: Cassandra [name] MRN: [id number] Date of Birth: 1965-10-02

## 2020-08-03 ENCOUNTER — Ambulatory Visit: Payer: PRIVATE HEALTH INSURANCE | Admitting: Physical Therapy

## 2020-08-03 ENCOUNTER — Ambulatory Visit: Payer: No Typology Code available for payment source | Attending: Orthopaedic Surgery | Admitting: Physical Therapy

## 2020-08-03 ENCOUNTER — Encounter: Payer: Self-pay | Admitting: Physical Therapy

## 2020-08-03 ENCOUNTER — Ambulatory Visit: Payer: No Typology Code available for payment source | Admitting: Physical Therapy

## 2020-08-03 ENCOUNTER — Other Ambulatory Visit: Payer: Self-pay

## 2020-08-03 DIAGNOSIS — M6281 Muscle weakness (generalized): Secondary | ICD-10-CM | POA: Diagnosis present

## 2020-08-03 DIAGNOSIS — M25671 Stiffness of right ankle, not elsewhere classified: Secondary | ICD-10-CM | POA: Insufficient documentation

## 2020-08-03 DIAGNOSIS — M25571 Pain in right ankle and joints of right foot: Secondary | ICD-10-CM | POA: Insufficient documentation

## 2020-08-03 NOTE — Therapy (Signed)
[facility] PHYS[facility]ICINE [address], [place], [address] Phone: [phone]   Fax:  [phone]  Physical Therapy Treatment  Patient Details  Name: Cassandra [name] MRN: [id number] Date of Birth: 05-Mar-1966 Referring Provider (PT): Nicki Guadalajara, MD   Encounter Date: 08/03/2020   PT End of Session - 08/03/20 1954    Visit Number 2    Number of Visits 24    Date for PT Re-Evaluation 10/24/20    Authorization Type [facility] [facility] (Work Comp) reporting period from 08/01/2020    Authorization Time Period Worker's Comp Quarry manager by Venida Jarvis, RN. Evaluation plus 8 visits. [facility] [id number]    Authorization - Visit Number 2    Authorization - Number of Visits 9    Progress Note Due on Visit 9    PT Start Time 1645    PT Stop Time 1725    PT Time Calculation (min) 40 min    Activity Tolerance Patient tolerated treatment well    Behavior During Therapy WFL for tasks assessed/performed           Past Medical History:  Diagnosis Date  . Chicken pox   . Diabetes mellitus without complication (HCC)   . Herpes   . Hypertension     Past Surgical History:  Procedure Laterality Date  . CHOLECYSTECTOMY  2015  . HERNIA REPAIR     55 years old  . TUBAL LIGATION  1991    There were no vitals filed for this visit.   Subjective Assessment - 08/03/20 1645    Subjective Patient reports she has been doing her HEP and it is going pretty well. States her foot is burning a little bit after being on her feet all day. She felt okay following last session. She is still baking at work. States she felt like her alphabet exercises helped her ankle feel better.    Pertinent History Patient is a 55 y.o. female who presents to outpatient physical therapy with a referral for medical diagnosis right ankle pain. This patient's chief complaints consist of work related injury 05/02/2020 leading to R ankle pain and swelling leading to the  following functional deficits: difficulty with usual activities such as working (currenlty on light duty due to injury), prolonged weight bearing (walking/standing), stairs, walking, housework, sleeping, community activities that require standing such as shopping, church activities. Relevant past medical history and comorbidities include diabetes, high blood pressure, GERD, cholecystectomy, hernia repair (no longer bothers her). Patient denies hx of cancer, stroke, seizures, lung problem, major cardiac events, unexplained weight loss, changes in bowel or bladder problems, new onset stumbling or dropping things.    Limitations Standing;Walking;House hold activities;Other (comment)   difficulty with usual activities such as working (currenlty on light duty due to injury), prolonged weight bearing (walking/standing), stairs, walking, housework, sleeping, community activities that require standing such as shopping, church activities.   Diagnostic tests MRI report of R ankle 06/26/2020: "IMPRESSION:  1. Bone marrow edema in the posterior lateral tibial plafond  adjacent talus with adjacent soft tissue edema which may reflect  osseous contusion secondary to direct trauma versus secondary to  posterior ankle impingement syndrome.  2. Anterior tibiofibular ligament is thickened and irregular  consistent with prior injury."    Patient Stated Goals get better    Currently in Pain? Yes    Pain Score 5     Pain Onset More than a month ago  TREATMENT:  - not sensitive to latex  Therapeutic exercise:to centralize symptoms and improve ROM, strength, muscular endurance, and activity tolerance required for successful completion of functional activities.  - seated alphabet - Seated left ankle BAPS board, level 2, plantarflexion/dorsiflexion, inversion/eversion, circles clockwise, circles counter clockwise, 2 min each plus time for instruction and transitions. Pt required cuing to keep knee still and  instruction on how to perform exercise.  - seated L heel raise to toe raise, 1x20 each side, 2 second holds - seated figure 4 L ankle inversion against yellow theraband, 1x20, 2 second hold - seated L ankle eversion against yellow theraband, 1x20, 2 second hold - seated L toe splays, 2 second hold, 1x20 - Toe Yoga: great toe extension with small toes flexion pressure into floor, repeated 2 sec holds; small toes extension with great toe flexion pressure into floor, repeated 2 sec holds. Ball of foot and heel maintains contact with floor. To improve intrinsic foot muscle activation and strength in order to better support arch and intrinsic foot structures. 1x20 each way.  - single leg stance with UE support as needed, 3x 30 seconds each side.  - Education on HEP including handout    Pt required multimodal cuing for proper technique and to facilitate improved neuromuscular control, strength, range of motion, and functional ability resulting in improved performance and form.  HOME EXERCISE PROGRAM Access Code: [email or web address] URL: [email or web address] Date: 08/03/2020 Prepared by: Norton Blizzard  Exercises Seated Ankle Alphabet - 1-2 x daily Seated Heel Raise - 1 x daily - 1 sets - 20 reps - 2 seconds hold Seated Toe Raise - 1 x daily - 1 sets - 20 reps - 2 seconds hold Seated Figure 4 Ankle Inversion with Resistance - 1 x daily - 1 sets - 20 reps - 2 seconds hold Seated Ankle Eversion with Resistance - 1 x daily - 1 sets - 20 reps - 2 seconds hold Toe Spreading - 1 x daily - 1 sets - 20 reps - 2 seconds hold Seated Great Toe Extension - 1 x daily - 1 sets - 20 reps - 2 seconds hold Seated Lesser Toes Extension - 1 x daily - 1 sets - 20 reps - 2 seconds hold Single Leg Stance - 1 x daily - 2 reps - 30 seconds hold     PT Education - 08/03/20 1954    Education Details Exercise purpose/form. Self management techniques    Person(s) Educated Patient    Methods  Explanation;Demonstration;Tactile cues;Verbal cues;Handout    Comprehension Verbalized understanding;Returned demonstration;Verbal cues required;Tactile cues required;Need further instruction            PT Short Term Goals - 08/01/20 1945      PT SHORT TERM GOAL #1   Title Be independent with initial home exercise program for self-management of symptoms.    Baseline Initial HEP provided at IE (08/01/2020);    Time 2    Period Weeks    Status New    Target Date 08/15/20             PT Long Term Goals - 08/01/20 1946      PT LONG TERM GOAL #1   Title Be independent with a long-term home exercise program for self-management of symptoms.    Baseline initial HEP provided at IE (08/01/2020);    Time 12    Period Weeks    Status New   TARGET DATE FOR ALL LONG TERM GOALS: 10/24/2020     PT  LONG TERM GOAL #2   Title Demonstrate improved FOTO score equal or greater than 66 by visit # 14 to demonstrate improvement in overall condition and self-reported functional ability.    Baseline 60 (08/01/2020);    Time 12    Period Weeks    Status New      PT LONG TERM GOAL #3   Title Patient will demonstrate R ankle A/PROM equal or greater than L ankle A/PROM with no increase in pain to improve abliity to complete work activities without pain or restrictions.    Baseline R ankle lacking and painful - see objective exam (08/01/2020);    Time 12    Period Weeks    Status New      PT LONG TERM GOAL #4   Title Improve R ankle strength equal or greater to L ankle strength without increased pain to improve abliity to complete work activities without pain or restrictions.    Baseline painful and weak - see objective exam (08/01/2020);    Time 12    Period Weeks    Status New      PT LONG TERM GOAL #5   Title Complete community, work and/or recreational activities without limitation due to current condition    Baseline Functional Limitations: difficulty with usual activities such as working  (currenlty on light duty due to injury), prolonged weight bearing (walking/standing), stairs, walking, housework, sleeping, community activities that require standing such as shopping, church activities (08/01/2020);    Time 12    Period Weeks    Status New                 Plan - 08/03/20 1957    Clinical Impression Statement Patient tolerated treatment well overall with mild increase in discomfort by end of session. Educated patient about acceptable discomfort and prolonged irritation. Updated HEP to include gentle foot and ankle strengthening and proprioceptive exercises. Continues to be most tender to ankle eversion and weight bearing. Has difficulty with single leg weight bearing on R LE due to discomfort and proprioceptive deficits. Patient participating well and gives good effort. Patient would benefit from continued management of limiting condition by skilled physical therapist to address remaining impairments and functional limitations to work towards stated goals and return to PLOF or maximal functional independence.    Personal Factors and Comorbidities Age;Comorbidity 3+    Comorbidities diabetes, high blood pressure, GERD, cholecystectomy    Examination-Activity Limitations Stairs;Stand;Locomotion Level;Caring for [employer];Sleep    Examination-Participation Restrictions Interpersonal Relationship;Occupation;Yard Work;Community Activity;Volunteer;Shop;Cleaning   difficulty with usual activities such as working (currenlty on [employer] duty due to injury), prolonged weight bearing (walking/standing), stairs, walking, housework, sleeping, community activities that require standing such as shopping, church activities.   Stability/Clinical Decision Making Evolving/Moderate complexity    Rehab Potential Good    PT Frequency 2x / week    PT Duration 12 weeks   as needed   PT Treatment/Interventions ADLs/Self Care Home Management;Aquatic Therapy;Cryotherapy;Moist Heat;Electrical Stimulation;Gait  training;Stair training;Functional mobility training;Therapeutic activities;Therapeutic exercise;Balance training;Neuromuscular re-education;Manual techniques;Dry needling;Passive range of motion;Spinal Manipulations;Joint Manipulations;Patient/family education    PT Next Visit Plan tolerated ankle strengthening, proprioception    PT Home Exercise Plan Medbridge  Access Code: [email or web address]    Consulted and Agree with Plan of Care Patient           Patient will benefit from skilled therapeutic intervention in order to improve the following deficits and impairments:  Abnormal gait,Improper body mechanics,Pain,Decreased coordination,Decreased mobility,Decreased activity tolerance,Decreased endurance,Decreased range of motion,Decreased strength,Hypomobility,Impaired perceived  functional ability,Impaired flexibility,Increased edema,Difficulty walking,Decreased balance  Visit Diagnosis: Right ankle pain, unspecified chronicity  Muscle weakness (generalized)  Stiffness of right ankle, not elsewhere classified     Problem List Patient Active Problem List   Diagnosis Date Noted  . Right ear impacted cerumen 04/17/2020  . Right foot pain 01/12/2020  . Hyperlipidemia 05/05/2018  . Menorrhagia with regular cycle 12/25/2017  . Suprapubic pain 12/15/2015  . GERD (gastroesophageal reflux disease) 11/01/2015  . Diarrhea 05/19/2015  . Routine physical examination 02/20/2015  . Diabetes mellitus type 2, controlled, without complications (HCC) 02/20/2015  . Benign essential HTN 02/20/2015  . Herpes simplex 02/20/2015    Luretha Murphy. Ilsa Iha, PT, DPT 08/03/20, 7:58 PM  [facility] PHYSICAL AND SPORTS MEDICINE [address], [place], [address] Phone: [phone]   Fax:  [phone]  Name: Cassandra [name] MRN: [id number] Date of Birth: [birth year]

## 2020-08-04 ENCOUNTER — Other Ambulatory Visit: Payer: Self-pay | Admitting: Family

## 2020-08-04 DIAGNOSIS — E119 Type 2 diabetes mellitus without complications: Secondary | ICD-10-CM

## 2020-08-05 ENCOUNTER — Other Ambulatory Visit: Payer: Self-pay

## 2020-08-05 ENCOUNTER — Other Ambulatory Visit: Payer: Self-pay | Admitting: Family

## 2020-08-05 DIAGNOSIS — E119 Type 2 diabetes mellitus without complications: Secondary | ICD-10-CM

## 2020-08-05 MED FILL — Dulaglutide Soln Auto-injector 3 MG/0.5ML: SUBCUTANEOUS | 28 days supply | Qty: 2 | Fill #0 | Status: AC

## 2020-08-08 ENCOUNTER — Other Ambulatory Visit: Payer: Self-pay

## 2020-08-08 ENCOUNTER — Ambulatory Visit: Payer: PRIVATE HEALTH INSURANCE | Admitting: Physical Therapy

## 2020-08-09 ENCOUNTER — Other Ambulatory Visit: Payer: Self-pay

## 2020-08-09 ENCOUNTER — Ambulatory Visit: Payer: PRIVATE HEALTH INSURANCE

## 2020-08-09 DIAGNOSIS — M25571 Pain in right ankle and joints of right foot: Secondary | ICD-10-CM | POA: Diagnosis not present

## 2020-08-09 DIAGNOSIS — M6281 Muscle weakness (generalized): Secondary | ICD-10-CM

## 2020-08-09 DIAGNOSIS — M25671 Stiffness of right ankle, not elsewhere classified: Secondary | ICD-10-CM

## 2020-08-09 NOTE — Therapy (Signed)
[facility] [address] [place], [address] Phone: [phone]   Fax:  [phone]  Physical Therapy Treatment  Patient Details  Name: Cassandra [name] MRN: [id number] Date of Birth: 23-Aug-1965 Referring Provider (PT): Nicki Guadalajara, MD   Encounter Date: 08/09/2020   PT End of Session - 08/09/20 1738    Visit Number 3    Number of Visits 24    Date for PT Re-Evaluation 10/24/20    Authorization Type [facility] [facility] (Work Comp) reporting period from 08/01/2020    Authorization Time Period Worker's Comp Quarry manager by Venida Jarvis, RN. Evaluation plus 8 visits. [facility] [id number]    PT Start Time 1530    PT Stop Time 1610    PT Time Calculation (min) 40 min    Activity Tolerance Patient tolerated treatment well;No increased pain    Behavior During Therapy WFL for tasks assessed/performed           Past Medical History:  Diagnosis Date  . Chicken pox   . Diabetes mellitus without complication (HCC)   . Herpes   . Hypertension     Past Surgical History:  Procedure Laterality Date  . CHOLECYSTECTOMY  2015  . HERNIA REPAIR     55 years old  . TUBAL LIGATION  1991    There were no vitals filed for this visit.   Subjective Assessment - 08/09/20 1734    Subjective Pt doing well today, symptoms unchanged. She is compliant with HEP without adverse affect.    Pertinent History Patient is a 55 y.o. female who presents to outpatient physical therapy with a referral for medical diagnosis right ankle pain. This patient's chief complaints consist of work related injury 05/02/2020 leading to R ankle pain and swelling leading to the following functional deficits: difficulty with usual activities such as working (currenlty on light duty due to injury), prolonged weight bearing (walking/standing), stairs, walking, housework, sleeping, community activities that require standing such as shopping, church activities.  Relevant past medical history and comorbidities include diabetes, high blood pressure, GERD, cholecystectomy, hernia repair (no longer bothers her). Patient denies hx of cancer, stroke, seizures, lung problem, major cardiac events, unexplained weight loss, changes in bowel or bladder problems, new onset stumbling or dropping things.    Currently in Pain? Yes    Pain Score 3     Pain Location Ankle    Pain Orientation --   Right anterior capsule of talocrural joint.   Pain Descriptors / Indicators Burning           INTERVENTION THIS DATE:  -AA/ROM Right ankle Sagittal plane heel slides x 2 minutes  -Right ankle circles on slider 60sec CW, 60sec CCW  -Yellow TB loop ankle eversion with ball betwixt hindfeet 15x3secH  -Standing heel raises (in sync) BUE support on tall plinth 2x15 -wall leaning ankle DF (in sync) 2x15 bilat  -Semitandem stance airex foam 2x30sec bilat (full step through with linear straddle)  -Narrow stance airex pad with trunk rotation 1x10 bilat    PT Short Term Goals - 08/01/20 1945      PT SHORT TERM GOAL #1   Title Be independent with initial home exercise program for self-management of symptoms.    Baseline Initial HEP provided at IE (08/01/2020);    Time 2    Period Weeks    Status New    Target Date 08/15/20             PT  Long Term Goals - 08/01/20 1946      PT LONG TERM GOAL #1   Title Be independent with a long-term home exercise program for self-management of symptoms.    Baseline initial HEP provided at IE (08/01/2020);    Time 12    Period Weeks    Status New   TARGET DATE FOR ALL LONG TERM GOALS: 10/24/2020     PT LONG TERM GOAL #2   Title Demonstrate improved FOTO score equal or greater than 66 by visit # 14 to demonstrate improvement in overall condition and self-reported functional ability.    Baseline 60 (08/01/2020);    Time 12    Period Weeks    Status New      PT LONG TERM GOAL #3   Title Patient will demonstrate R ankle A/PROM  equal or greater than L ankle A/PROM with no increase in pain to improve abliity to complete work activities without pain or restrictions.    Baseline R ankle lacking and painful - see objective exam (08/01/2020);    Time 12    Period Weeks    Status New      PT LONG TERM GOAL #4   Title Improve R ankle strength equal or greater to L ankle strength without increased pain to improve abliity to complete work activities without pain or restrictions.    Baseline painful and weak - see objective exam (08/01/2020);    Time 12    Period Weeks    Status New      PT LONG TERM GOAL #5   Title Complete community, work and/or recreational activities without limitation due to current condition    Baseline Functional Limitations: difficulty with usual activities such as working (currenlty on light duty due to injury), prolonged weight bearing (walking/standing), stairs, walking, housework, sleeping, community activities that require standing such as shopping, church activities (08/01/2020);    Time 12    Period Weeks    Status New                 Plan - 08/09/20 1741    Clinical Impression Statement Continued with current plan of care as laid out in evaluation and recent prior sessions. Pt remains motivated to advance progress toward goals. Symptoms remain largely unchanged. Right lateral ankle remains fairly edematous, albeit pt here after workday. Rest breaks provided as needed. Author maintains all interventions within appropriate level of intensity as not to purposefully exacerbate pain. Pt able to progress to more closed chain activity and strengthening today. Pt does require varying levels of assistance and cuing for completion of exercises for correct form and sometimes due to pain/weakness. No updates to HEP this date.    Personal Factors and Comorbidities Age;Comorbidity 3+    Comorbidities diabetes, high blood pressure, GERD, cholecystectomy    Examination-Activity Limitations  Stairs;Stand;Locomotion Level;Caring for [employer]    Examination-Participation Restrictions Interpersonal Relationship;Occupation;Yard Work;Community Activity;Volunteer;Shop;Cleaning    Stability/Clinical Decision Making Stable/Uncomplicated    Clinical Decision Making Moderate    Rehab Potential Good    PT Frequency 2x / week    PT Duration 12 weeks    PT Treatment/Interventions ADLs/Self Care Home Management;Aquatic Therapy;Cryotherapy;Moist Heat;Electrical Stimulation;Gait training;Stair training;Functional mobility training;Therapeutic activities;Therapeutic exercise;Balance training;Neuromuscular re-education;Manual techniques;Dry needling;Passive range of motion;Spinal Manipulations;Joint Manipulations;Patient/family education    PT Next Visit Plan tolerated ankle strengthening, proprioception    PT Home Exercise Plan Medbridge  Access Code: [email or web address]    Consulted and Agree with Plan of Care Patient  Patient will benefit from skilled therapeutic intervention in order to improve the following deficits and impairments:  Abnormal gait,Improper body mechanics,Pain,Decreased coordination,Decreased mobility,Decreased activity tolerance,Decreased endurance,Decreased range of motion,Decreased strength,Hypomobility,Impaired perceived functional ability,Impaired flexibility,Increased edema,Difficulty walking,Decreased balance  Visit Diagnosis: Right ankle pain, unspecified chronicity  Muscle weakness (generalized)  Stiffness of right ankle, not elsewhere classified     Problem List Patient Active Problem List   Diagnosis Date Noted  . Right ear impacted cerumen 04/17/2020  . Right foot pain 01/12/2020  . Hyperlipidemia 05/05/2018  . Menorrhagia with regular cycle 12/25/2017  . Suprapubic pain 12/15/2015  . GERD (gastroesophageal reflux disease) 11/01/2015  . Diarrhea 05/19/2015  . Routine physical examination 02/20/2015  . Diabetes mellitus type 2, controlled, without  complications (HCC) 02/20/2015  . Benign essential HTN 02/20/2015  . Herpes simplex 02/20/2015   6:03 PM, 08/09/20 Rosamaria Lints, PT, DPT Physical Therapist - [place] [phone] (Office)   Roy Snuffer C 08/09/2020, 5:47 PM  [facility] PHYSICAL AND SPORTS MEDICINE [address] [place], [address] Phone: [phone]   Fax:  [phone]  Name: Cassandra [name] MRN: [id number] Date of Birth: Aug 26, 1965

## 2020-08-10 ENCOUNTER — Encounter: Payer: No Typology Code available for payment source | Admitting: Physical Therapy

## 2020-08-10 ENCOUNTER — Other Ambulatory Visit: Payer: Self-pay

## 2020-08-10 ENCOUNTER — Other Ambulatory Visit: Payer: Self-pay | Admitting: Family

## 2020-08-10 DIAGNOSIS — E119 Type 2 diabetes mellitus without complications: Secondary | ICD-10-CM

## 2020-08-15 ENCOUNTER — Encounter: Payer: No Typology Code available for payment source | Admitting: Physical Therapy

## 2020-08-16 ENCOUNTER — Telehealth: Payer: Self-pay | Admitting: Physical Therapy

## 2020-08-16 ENCOUNTER — Ambulatory Visit: Payer: PRIVATE HEALTH INSURANCE | Attending: Orthopaedic Surgery | Admitting: Physical Therapy

## 2020-08-16 DIAGNOSIS — M25671 Stiffness of right ankle, not elsewhere classified: Secondary | ICD-10-CM | POA: Insufficient documentation

## 2020-08-16 DIAGNOSIS — M6281 Muscle weakness (generalized): Secondary | ICD-10-CM | POA: Insufficient documentation

## 2020-08-16 DIAGNOSIS — M25571 Pain in right ankle and joints of right foot: Secondary | ICD-10-CM | POA: Insufficient documentation

## 2020-08-16 NOTE — Telephone Encounter (Signed)
Called patient when she did not come to her 5:30pm appointment today. Patient answered and was very apologetic. Thought her appointment was tomorrow but checked her paper and realized it was today. Wanted to reschedule to 4pm or after in the remaining days this week, but nothing was currently available. Confirmed next appointment Tuesday 08/23/2020 at 4pm.   [place] Dec R. Ilsa Iha, PT, DPT 08/16/20, 5:57 PM

## 2020-08-17 ENCOUNTER — Encounter: Payer: No Typology Code available for payment source | Admitting: Physical Therapy

## 2020-08-22 ENCOUNTER — Encounter: Payer: No Typology Code available for payment source | Admitting: Physical Therapy

## 2020-08-23 ENCOUNTER — Encounter: Payer: Self-pay | Admitting: Physical Therapy

## 2020-08-23 ENCOUNTER — Other Ambulatory Visit: Payer: Self-pay

## 2020-08-23 ENCOUNTER — Ambulatory Visit: Payer: PRIVATE HEALTH INSURANCE | Admitting: Physical Therapy

## 2020-08-23 DIAGNOSIS — M6281 Muscle weakness (generalized): Secondary | ICD-10-CM | POA: Diagnosis present

## 2020-08-23 DIAGNOSIS — M25571 Pain in right ankle and joints of right foot: Secondary | ICD-10-CM

## 2020-08-23 DIAGNOSIS — M25671 Stiffness of right ankle, not elsewhere classified: Secondary | ICD-10-CM | POA: Diagnosis present

## 2020-08-23 NOTE — Therapy (Signed)
[facility] PHYSICAL AND SPORTS MEDICINE [address] [place], [place], [address] Phone: [phone]   Fax:  [phone]  Physical Therapy Treatment  Patient Details  Name: Cassandra [name] MRN: [id number] Date of Birth: 1966/03/22 Referring Provider (PT): Nicki Guadalajarahris Adair, MD   Encounter Date: 08/23/2020   PT End of Session - 08/23/20 1914    Visit Number 4    Number of Visits 24    Date for PT Re-Evaluation 10/24/20    Authorization Type [employer] (Work Comp) reporting period from 08/01/2020    Authorization Time Period Worker's Comp Quarry managerauth by Venida JarvisPatty Harper, RN. Evaluation plus 8 visits. [facility] [id number]    Authorization - Visit Number 4    Authorization - Number of Visits 9    Progress Note Due on Visit 9    PT Start Time 1607    PT Stop Time 1645    PT Time Calculation (min) 38 min    Activity Tolerance Patient tolerated treatment well;Patient limited by pain    Behavior [facility] tasks assessed/performed           Past Medical History:  Diagnosis Date  . Chicken pox   . Diabetes mellitus without complication (HCC)   . Herpes   . Hypertension     Past Surgical History:  Procedure Laterality Date  . CHOLECYSTECTOMY  2015  . HERNIA REPAIR     55 years old  . TUBAL LIGATION  1991    There were no vitals filed for this visit.   Subjective Assessment - 08/23/20 1607    Subjective Patient reports her pain is 2-3/10 buring in her right ankle distal to the lateral malleolous. She has been doing her HEP which she feels helps with the swelling (which is improved) but the pain has remained the same with a burning sensation. She just got off of work and came to PT.    Pertinent History Patient is a 55 y.o. female who presents to outpatient physical therapy with a referral for medical diagnosis right ankle pain. This patient's chief complaints consist of work related injury 05/02/2020 leading to R ankle pain and  swelling leading to the following functional deficits: difficulty with usual activities such as working (currenlty on light duty due to injury), prolonged weight bearing (walking/standing), stairs, walking, housework, sleeping, community activities that require standing such as shopping, church activities. Relevant past medical history and comorbidities include diabetes, high blood pressure, GERD, cholecystectomy, hernia repair (no longer bothers her). Patient denies hx of cancer, stroke, seizures, lung problem, major cardiac events, unexplained weight loss, changes in bowel or bladder problems, new onset stumbling or dropping things.    Currently in Pain? Yes    Pain Score 3             TREATMENT: - not sensitive to latex  Manual therapy: to reduce pain and tissue tension, improve range of motion, neuromodulation, in order to promote improved ability to complete functional activities. LONG SITTING - R ankle joint mobilizations, grade II-IV (as tolerated): AP to distal fibula, PA/AP to proximal fibula, gentle distraction (discontinued painful). - R foot inter-tarsal/metatarsal mobilization grade I-II at cuboid lateral metatarsals.  - R ankle/foot PROM plantar flexion (concordant pain),  - STM to right anterior and lateral lower leg compartments  SUPINE -  R sciatic nerve glide with fibular nerve bias test and x 10 reps (not positive to sensitizing maneuver).   Therapeutic exercise:to centralize symptoms and improve ROM, strength,  muscular endurance, and activity tolerance required for successful completion of functional activities. - standing R ankle inversion with tibial ER with yellow theraband wrapped around leg. 1x20 - standing R great toe flexion against yellow theraband, 1x20 - standing R lesser toes flexion against yellow theraband, 1x20 - standing B heel raises with inversion activation resisting yellow theraband tied around heels, 1x20 (difficult, band uncomfortable contacting  lateral R ankle) - standing B heel raise with R foot eversion activation with yellow band pulling laterally at R ankle, 1x20 - Education on HEP including handout   Pt required multimodal cuing for proper technique and to facilitate improved neuromuscular control, strength, range of motion, and functional ability resulting in improved performance and form.  HOME EXERCISE PROGRAM Access Code: [email or web address] URL: [email or web address] Date: 08/23/2020 Prepared by: Norton Blizzard  Exercises Seated Figure 4 Ankle Inversion with Resistance - 1 x daily - 1 sets - 20 reps - 2 seconds hold Seated Ankle Eversion with Resistance - 1 x daily - 1 sets - 20 reps - 2 seconds hold Single Leg Stance - 1 x daily - 2 reps - 30 seconds hold  [email or web address] CP [id number] Home Exercise Program [[id number]]  Foot supination control with elastic band wrap -  Repeat 20 Times, Hold 1 Second(s), Complete 2 Sets, Perform 1 Times a Day  Heel raise with band around heels -  Repeat 20 Times, Hold 1 Second(s), Complete 2 Sets, Perform 1 Times a Day  GREAT TOE FLEXION WITH BAND -  Repeat 20 Times, Hold 1 Second(s), Complete 2 Sets, Perform 1 Times a Day  TOE FLEXION WITH BAND: TOES 2-5  -  Repeat 20 Times, Hold 1 Second(s), Complete 2 Sets, Perform 1 Times a Day  heel raise with peroneal emphasis -  Repeat 20 Times, Hold 1 Second(s), Complete 2 Sets, Perform 1 Times a Day     PT Education - 08/23/20 1914    Education Details Exercise purpose/form. Self management techniques    Person(s) Educated Patient    Methods Explanation;Demonstration;Tactile cues;Verbal cues    Comprehension Verbalized understanding;Returned demonstration;Verbal cues required;Tactile cues required;Need further instruction            PT Short Term Goals - 08/01/20 1945      PT SHORT TERM GOAL #1   Title Be independent with initial home exercise program for self-management of symptoms.    Baseline Initial HEP provided at IE  (08/01/2020);    Time 2    Period Weeks    Status New    Target Date 08/15/20             PT Long Term Goals - 08/01/20 1946      PT LONG TERM GOAL #1   Title Be independent with a long-term home exercise program for self-management of symptoms.    Baseline initial HEP provided at IE (08/01/2020);    Time 12    Period Weeks    Status New   TARGET DATE FOR ALL LONG TERM GOALS: 10/24/2020     PT LONG TERM GOAL #2   Title Demonstrate improved FOTO score equal or greater than 66 by visit # 14 to demonstrate improvement in overall condition and self-reported functional ability.    Baseline 60 (08/01/2020);    Time 12    Period Weeks    Status New      PT LONG TERM GOAL #3   Title Patient will demonstrate R ankle A/PROM equal or greater than L ankle A/PROM with no increase  in pain to improve abliity to complete work activities without pain or restrictions.    Baseline R ankle lacking and painful - see objective exam (08/01/2020);    Time 12    Period Weeks    Status New      PT LONG TERM GOAL #4   Title Improve R ankle strength equal or greater to L ankle strength without increased pain to improve abliity to complete work activities without pain or restrictions.    Baseline painful and weak - see objective exam (08/01/2020);    Time 12    Period Weeks    Status New      PT LONG TERM GOAL #5   Title Complete community, work and/or recreational activities without limitation due to current condition    Baseline Functional Limitations: difficulty with usual activities such as working (currenlty on light duty due to injury), prolonged weight bearing (walking/standing), stairs, walking, housework, sleeping, community activities that require standing such as shopping, church activities (08/01/2020);    Time 12    Period Weeks    Status New                 Plan - 08/23/20 1926    Clinical Impression Statement Patient continues to present with swelling surrounding the lateral  malleolus and dorsal surface of foot. Increased concordant burring pain with plantar flexion and inversion with poor tolerance to this motion at end range. Continued to progress foot and ankle strengthening. Patient would benefit from continued management of limiting condition by skilled physical therapist to address remaining impairments and functional limitations to work towards stated goals and return to PLOF or maximal functional independence.    Personal Factors and Comorbidities Age;Comorbidity 3+    Comorbidities diabetes, high blood pressure, GERD, cholecystectomy    Examination-Activity Limitations Stairs;Stand;Locomotion Level;Caring for [employer]    Examination-Participation Restrictions Interpersonal Relationship;Occupation;Yard Work;Community Activity;Volunteer;Shop;Cleaning    Stability/Clinical Decision Making Stable/Uncomplicated    Rehab Potential Good    PT Frequency 2x / week    PT Duration 12 weeks    PT Treatment/Interventions ADLs/Self Care Home Management;Aquatic Therapy;Cryotherapy;Moist Heat;Electrical Stimulation;Gait training;Stair training;Functional mobility training;Therapeutic activities;Therapeutic exercise;Balance training;Neuromuscular re-education;Manual techniques;Dry needling;Passive range of motion;Spinal Manipulations;Joint Manipulations;Patient/family education    PT Next Visit Plan tolerated ankle strengthening, proprioception    PT Home Exercise Plan Medbridge  Access Code: [email or web address]    Consulted and Agree with Plan of Care Patient           Patient will benefit from skilled therapeutic intervention in order to improve the following deficits and impairments:  Abnormal gait,Improper body mechanics,Pain,Decreased coordination,Decreased mobility,Decreased activity tolerance,Decreased endurance,Decreased range of motion,Decreased strength,Hypomobility,Impaired perceived functional ability,Impaired flexibility,Increased edema,Difficulty walking,Decreased  balance  Visit Diagnosis: Right ankle pain, unspecified chronicity  Muscle weakness (generalized)  Stiffness of right ankle, not elsewhere classified     Problem List Patient Active Problem List   Diagnosis Date Noted  . Right ear impacted cerumen 04/17/2020  . Right foot pain 01/12/2020  . Hyperlipidemia 05/05/2018  . Menorrhagia with regular cycle 12/25/2017  . Suprapubic pain 12/15/2015  . GERD (gastroesophageal reflux disease) 11/01/2015  . Diarrhea 05/19/2015  . Routine physical examination 02/20/2015  . Diabetes mellitus type 2, controlled, without complications (HCC) 02/20/2015  . Benign essential HTN 02/20/2015  . Herpes simplex 02/20/2015    Luretha Murphy. Ilsa Iha, PT, DPT 08/23/20, 7:26 PM  [facility] PHYSICAL AND SPORTS MEDICINE [address] [place], [address] Phone: [phone]   Fax:  [phone]  Name: Cassandra [name] MRN: [id number] Date of Birth: Jan 16, 1966

## 2020-08-24 ENCOUNTER — Encounter: Payer: No Typology Code available for payment source | Admitting: Physical Therapy

## 2020-08-25 ENCOUNTER — Ambulatory Visit: Payer: PRIVATE HEALTH INSURANCE | Admitting: Physical Therapy

## 2020-08-25 ENCOUNTER — Encounter: Payer: Self-pay | Admitting: Physical Therapy

## 2020-08-25 ENCOUNTER — Other Ambulatory Visit: Payer: Self-pay

## 2020-08-25 DIAGNOSIS — M25571 Pain in right ankle and joints of right foot: Secondary | ICD-10-CM | POA: Diagnosis not present

## 2020-08-25 DIAGNOSIS — M6281 Muscle weakness (generalized): Secondary | ICD-10-CM

## 2020-08-25 DIAGNOSIS — M25671 Stiffness of right ankle, not elsewhere classified: Secondary | ICD-10-CM

## 2020-08-25 NOTE — Therapy (Signed)
[facility] MEDIC[facility] [address] [place], [address] Phone: [phone]   Fax:  [phone]  Physical Therapy Treatment  Patient Details  Name: Cassandra [name] MRN: [id number] Date of Birth: 05/03/1965 Referring Provider (PT): Nicki Guadalajara, MD   Encounter Date: 08/25/2020   PT End of Session - 08/25/20 1722    Visit Number 5    Number of Visits 24    Date for PT Re-Evaluation 10/24/20    Authorization Type [employer] (Work Comp) reporting period from 08/01/2020    Authorization Time Period Worker's Comp Quarry manager by Venida Jarvis, RN. Evaluation plus 8 visits. [facility] [id number]    Authorization - Visit Number 5    Authorization - Number of Visits 9    Progress Note Due on Visit 9    PT Start Time 1645    PT Stop Time 1725    PT Time Calculation (min) 40 min    Activity Tolerance Patient tolerated treatment well;Patient limited by pain    Behavior During Therapy [facility] for tasks assessed/performed           Past Medical History:  Diagnosis Date  . Chicken pox   . Diabetes mellitus without complication (HCC)   . Herpes   . Hypertension     Past Surgical History:  Procedure Laterality Date  . CHOLECYSTECTOMY  2015  . HERNIA REPAIR     55 years old  . TUBAL LIGATION  1991    There were no vitals filed for this visit.   Subjective Assessment - 08/25/20 1713    Subjective Patient reports her R ankle is buring upon arrival and rates her pain 4/10. States she did not do her HEP yesterday because it hurt so much. States she was sore for about 3 hours after last treatment session.    Pertinent History Patient is a 55 y.o. female who presents to outpatient physical therapy with a referral for medical diagnosis right ankle pain. This patient's chief complaints consist of work related injury 05/02/2020 leading to R ankle pain and swelling leading to the following functional deficits: difficulty with usual  activities such as working (currenlty on light duty due to injury), prolonged weight bearing (walking/standing), stairs, walking, housework, sleeping, community activities that require standing such as shopping, church activities. Relevant past medical history and comorbidities include diabetes, high blood pressure, GERD, cholecystectomy, hernia repair (no longer bothers her). Patient denies hx of cancer, stroke, seizures, lung problem, major cardiac events, unexplained weight loss, changes in bowel or bladder problems, new onset stumbling or dropping things.    Pain Score 4           OBJECTIVE FOTO = 57 (08/25/20)   TREATMENT: - not sensitive to latex  Therapeutic exercise:to centralize symptoms and improve ROM, strength, muscular endurance, and activity tolerance required for successful completion of functional activities. -Seated left ankle BAPS board, level 3, plantarflexion/dorsiflexion, inversion/eversion, circles clockwise, circles counter clockwise, x20 reps each plus time for instruction and transitions. Pt required cuing to keep knee still and instruction on how to perform exercise - standing left ankle BAPS board, level 3, CW and CCW x 10 each direction with B UE support.  -leaning towards wall ankle DF (in sync) 2x15 bilat  -Semitandem stance airex foam 2x30sec bilat (full step through with linear straddle)  -Narrow stance airex pad with trunk rotation holding 3kg med ball 1x20 bilat  Pt required multimodal cuing for proper technique and to facilitate improved  neuromuscular control, strength, range of motion, and functional ability resulting in improved performance and form.  HOME EXERCISE PROGRAM Access Code: [email or web address] URL: [email or web address] Date: 08/23/2020 Prepared by: Norton Blizzard  Exercises Seated Figure 4 Ankle Inversion with Resistance - 1 x daily - 1 sets - 20 reps - 2 seconds hold Seated Ankle Eversion with Resistance - 1 x daily - 1 sets - 20  reps - 2 seconds hold Single Leg Stance - 1 x daily - 2 reps - 30 seconds hold  [email or web address] CP [id number] Home Exercise Program [[id number]]  Foot supination control with elastic band wrap -  Repeat 20 Times, Hold 1 Second(s), Complete 2 Sets, Perform 1 Times a Day  Heel raise (seated or standing as tolerated)  -  Repeat 20 Times, Hold 1 Second(s), Complete 2 Sets, Perform 1 Times a Day  GREAT TOE FLEXION WITH BAND -  Repeat 20 Times, Hold 1 Second(s), Complete 2 Sets, Perform 1 Times a Day  TOE FLEXION WITH BAND: TOES 2-5  -  Repeat 20 Times, Hold 1 Second(s), Complete 2 Sets, Perform 1 Times a Day     PT Education - 08/25/20 1722    Education Details Exercise purpose/form. Self management techniques    Person(s) Educated Patient    Methods Explanation;Demonstration;Tactile cues;Verbal cues            PT Short Term Goals - 08/01/20 1945      PT SHORT TERM GOAL #1   Title Be independent with initial home exercise program for self-management of symptoms.    Baseline Initial HEP provided at IE (08/01/2020);    Time 2    Period Weeks    Status New    Target Date 08/15/20             PT Long Term Goals - 08/01/20 1946      PT LONG TERM GOAL #1   Title Be independent with a long-term home exercise program for self-management of symptoms.    Baseline initial HEP provided at IE (08/01/2020);    Time 12    Period Weeks    Status New   TARGET DATE FOR ALL LONG TERM GOALS: 10/24/2020     PT LONG TERM GOAL #2   Title Demonstrate improved FOTO score equal or greater than 66 by visit # 14 to demonstrate improvement in overall condition and self-reported functional ability.    Baseline 60 (08/01/2020);    Time 12    Period Weeks    Status New      PT LONG TERM GOAL #3   Title Patient will demonstrate R ankle A/PROM equal or greater than L ankle A/PROM with no increase in pain to improve abliity to complete work activities without pain or restrictions.    Baseline R ankle  lacking and painful - see objective exam (08/01/2020);    Time 12    Period Weeks    Status New      PT LONG TERM GOAL #4   Title Improve R ankle strength equal or greater to L ankle strength without increased pain to improve abliity to complete work activities without pain or restrictions.    Baseline painful and weak - see objective exam (08/01/2020);    Time 12    Period Weeks    Status New      PT LONG TERM GOAL #5   Title Complete community, work and/or recreational activities without limitation due to current condition    Baseline Functional Limitations: difficulty with  usual activities such as working (currenlty on [employer] duty due to injury), prolonged weight bearing (walking/standing), stairs, walking, housework, sleeping, community activities that require standing such as shopping, church activities (08/01/2020);    Time 12    Period Weeks    Status New                 Plan - 08/25/20 1721    Clinical Impression Statement Patient continues to have significant pain and swelling. Updated HEP to avoid exercise patient found so uncomfortable. Continued to work on motor control, proprioception, and ROM today. Able to move to level 3 on BAPS board but requires prolonged time to complete each rep properly. FOTO is slightly worse this session than at initial eval. Patient would benefit from continued management of limiting condition by skilled physical therapist to address remaining impairments and functional limitations to work towards stated goals and return to PLOF or maximal functional independence    Personal Factors and Comorbidities Age;Comorbidity 3+    Comorbidities diabetes, high blood pressure, GERD, cholecystectomy    Examination-Activity Limitations Stairs;Stand;Locomotion Level;Caring for [employer]    Examination-Participation Restrictions Interpersonal Relationship;Occupation;Yard Work;Community Activity;Volunteer;Shop;Cleaning    Stability/Clinical Decision Making  Stable/Uncomplicated    Rehab Potential Good    PT Frequency 2x / week    PT Duration 12 weeks    PT Treatment/Interventions ADLs/Self Care Home Management;Aquatic Therapy;Cryotherapy;Moist Heat;Electrical Stimulation;Gait training;Stair training;Functional mobility training;Therapeutic activities;Therapeutic exercise;Balance training;Neuromuscular re-education;Manual techniques;Dry needling;Passive range of motion;Spinal Manipulations;Joint Manipulations;Patient/family education    PT Next Visit Plan tolerated ankle strengthening, proprioception    PT Home Exercise Plan Medbridge  Access Code: [email or web address]    Consulted and Agree with Plan of Care Patient           Patient will benefit from skilled therapeutic intervention in order to improve the following deficits and impairments:  Abnormal gait,Improper body mechanics,Pain,Decreased coordination,Decreased mobility,Decreased activity tolerance,Decreased endurance,Decreased range of motion,Decreased strength,Hypomobility,Impaired perceived functional ability,Impaired flexibility,Increased edema,Difficulty walking,Decreased balance  Visit Diagnosis: Right ankle pain, unspecified chronicity  Muscle weakness (generalized)  Stiffness of right ankle, not elsewhere classified     Problem List Patient Active Problem List   Diagnosis Date Noted  . Right ear impacted cerumen 04/17/2020  . Right foot pain 01/12/2020  . Hyperlipidemia 05/05/2018  . Menorrhagia with regular cycle 12/25/2017  . Suprapubic pain 12/15/2015  . GERD (gastroesophageal reflux disease) 11/01/2015  . Diarrhea 05/19/2015  . Routine physical examination 02/20/2015  . Diabetes mellitus type 2, controlled, without complications (HCC) 02/20/2015  . Benign essential HTN 02/20/2015  . Herpes simplex 02/20/2015    Luretha Murphy. Ilsa Iha, PT, DPT 08/25/20, 5:27 PM  [facility] PHYSICAL AND SPORTS MEDICINE [address], [place],  [address] Phone: [phone]   Fax:  [phone]  Name: Cassandra [name] MRN: [id number] Date of Birth: 1966/03/21

## 2020-08-26 ENCOUNTER — Ambulatory Visit (INDEPENDENT_AMBULATORY_CARE_PROVIDER_SITE_OTHER): Payer: No Typology Code available for payment source | Admitting: Family

## 2020-08-26 ENCOUNTER — Encounter: Payer: Self-pay | Admitting: Family

## 2020-08-26 VITALS — BP 118/62 | HR 91 | Temp 97.6°F | Ht 67.0 in | Wt 203.8 lb

## 2020-08-26 DIAGNOSIS — Z1231 Encounter for screening mammogram for malignant neoplasm of breast: Secondary | ICD-10-CM

## 2020-08-26 DIAGNOSIS — E785 Hyperlipidemia, unspecified: Secondary | ICD-10-CM

## 2020-08-26 DIAGNOSIS — E119 Type 2 diabetes mellitus without complications: Secondary | ICD-10-CM

## 2020-08-26 DIAGNOSIS — I10 Essential (primary) hypertension: Secondary | ICD-10-CM | POA: Diagnosis not present

## 2020-08-26 LAB — POCT GLYCOSYLATED HEMOGLOBIN (HGB A1C): Hemoglobin A1C: 6.8 % — AB (ref 4.0–5.6)

## 2020-08-26 NOTE — Assessment & Plan Note (Addendum)
Lab Results  Component Value Date   HGBA1C 6.8 (A) 08/26/2020   Controlled.  Continue jardiance 10mg ,trulicity 3mg , metformin 2000mg  qd.Due for exam and she will schedule.

## 2020-08-26 NOTE — Assessment & Plan Note (Signed)
Controlled. Continue lisinopril 10mg 

## 2020-08-26 NOTE — Assessment & Plan Note (Signed)
Controlled. Continue lipitor 20mg 

## 2020-08-26 NOTE — Progress Notes (Signed)
Subjective:    Patient ID: Cassandra [name], female    DOB: 04-02-1966, 55 y.o.   MRN: [id number]  CC: Cassandra [name] is a 55 y.o. female who presents today for follow up.   HPI: Feels well today  No new complaints  HTN- compliant with lisinopril. No cp, sob  DM- compliant with jardiance 10mg ,trulicity 3mg , metformin 2000mg  qd. No feet numbness.   Continues PT and following with podiatry for right ankle pain.     HISTORY:  Past Medical History:  Diagnosis Date  . Chicken pox   . Diabetes mellitus without complication (HCC)   . Herpes   . Hypertension    Past Surgical History:  Procedure Laterality Date  . CHOLECYSTECTOMY  2015  . HERNIA REPAIR     55 years old  . TUBAL LIGATION  1991   Family History  Problem Relation Age of Onset  . Hypertension Mother   . Diabetes Mother   . Breast cancer Neg Hx   . Thyroid cancer Neg Hx     Allergies: Glipizide Current Outpatient Medications on File Prior to Visit  Medication Sig Dispense Refill  . atorvastatin (LIPITOR) 20 MG tablet TAKE 1 TABLET BY MOUTH DAILY. 90 tablet 3  . Dulaglutide (TRULICITY) 3 MG/0.5ML SOPN INJECT 3 MG INTO THE SKIN ONCE A WEEK. 2 mL 3  . empagliflozin (JARDIANCE) 10 MG TABS tablet TAKE 1 TABLET BY MOUTH ONCE DAILY IN THE MORNING. 90 tablet 1  . lisinopril (ZESTRIL) 10 MG tablet TAKE 1 TABLET BY MOUTH DAILY. 90 tablet 3  . metFORMIN (GLUCOPHAGE-XR) 500 MG 24 hr tablet TAKE 2 TABLETS BY MOUTH 2 TIMES DAILY WITH A MEAL 360 tablet 1  . omeprazole (PRILOSEC) 20 MG capsule TAKE 1 CAPSULE BY MOUTH DAILY. 30 capsule 0  . gabapentin (NEURONTIN) 300 MG capsule TAKE 1 AT BEDTIME FOR 3 DAYS. THEN TAKE TWICE A DAY FOR 3 DAYS. THEN CAN INCREASE TO THREE TIMES A DAY IF TOLERATED. (Patient not taking: Reported on 08/26/2020) 60 capsule 0  . meloxicam (MOBIC) 15 MG tablet TAKE 1 TABLET BY MOUTH ONCE DAILY WITH A MEAL (Patient not taking: Reported on 08/26/2020) 6 tablet 0  . meloxicam (MOBIC) 7.5 MG tablet TAKE 1  TABLET BY MOUTH DAILY. (Patient not taking: Reported on 08/26/2020) 30 tablet 0   No current facility-administered medications on file prior to visit.    Social History   Tobacco Use  . Smoking status: Never Smoker  . Smokeless tobacco: Never Used  Vaping Use  . Vaping Use: Never used  Substance Use Topics  . Alcohol use: No    Alcohol/week: 0.0 standard drinks  . Drug use: No    Review of Systems    Objective:    BP 118/62 (BP Location: Left Arm, Patient Position: Sitting, Cuff Size: Large)   Pulse 91   Temp 97.6 F (36.4 C) (Oral)   Ht 5\' 7"  (1.702 m)   Wt 203 lb 12.8 oz (92.4 kg)   SpO2 98%   BMI 31.92 kg/m  BP Readings from Last 3 Encounters:  08/26/20 118/62  05/02/20 (!) 146/71  04/13/20 112/78   Wt Readings from Last 3 Encounters:  08/26/20 203 lb 12.8 oz (92.4 kg)  05/02/20 204 lb (92.5 kg)  04/13/20 203 lb 12.8 oz (92.4 kg)    Physical Exam     Assessment & Plan:   Problem List Items Addressed This Visit      Cardiovascular and Mediastinum   Benign  essential HTN    Controlled. Continue lisinopril 10mg         Endocrine   Diabetes mellitus type 2, controlled, without complications (HCC) - Primary    Lab Results  Component Value Date   HGBA1C 6.8 (A) 08/26/2020   Controlled.  Continue jardiance 10mg ,trulicity 3mg , metformin 2000mg  qd.Due for exam and she will schedule.        Relevant Orders   POCT HgB A1C (Completed)   Comprehensive metabolic panel     Other   Hyperlipidemia    Controlled. Continue lipitor 20mg .        Other Visit Diagnoses    Encounter for screening mammogram for malignant neoplasm of breast       Relevant Orders   MM 3D SCREEN BREAST BILATERAL       I have discontinued Brie D. Markowicz's COVID-19 mRNA Vac-TriS 08/28/2020). I am also having her maintain her gabapentin, lisinopril, empagliflozin, atorvastatin, meloxicam, omeprazole, meloxicam, metFORMIN, and Trulicity.   No orders of the defined types were  placed in this encounter.   Return precautions given.   Risks, benefits, and alternatives of the medications and treatment plan prescribed today were discussed, and patient expressed understanding.   Education regarding symptom management and diagnosis given to patient on AVS.  Continue to follow with , FNP for routine health maintenance.   Leyani D Edison and I agreed with plan.   , FNP

## 2020-08-26 NOTE — Patient Instructions (Addendum)
Tetanus vaccine due ( Tdap)  We will schedule your mammogram Let us know if you dont hear back within a week in regards to an appointment being scheduled.   Please make a follow up with GYN for pap smear  And your eye exam  Let me know if you or your sister need anything at all.

## 2020-08-27 LAB — COMPREHENSIVE METABOLIC PANEL
AG Ratio: 1.4 (calc) (ref 1.0–2.5)
ALT: 14 U/L (ref 6–29)
AST: 13 U/L (ref 10–35)
Albumin: 3.8 g/dL (ref 3.6–5.1)
Alkaline phosphatase (APISO): 69 U/L (ref 37–153)
BUN: 14 mg/dL (ref 7–25)
CO2: 28 mmol/L (ref 20–32)
Calcium: 9.2 mg/dL (ref 8.6–10.4)
Chloride: 104 mmol/L (ref 98–110)
Creat: 0.8 mg/dL (ref 0.50–1.05)
Globulin: 2.7 g/dL (calc) (ref 1.9–3.7)
Glucose, Bld: 184 mg/dL — ABNORMAL HIGH (ref 65–99)
Potassium: 3.9 mmol/L (ref 3.5–5.3)
Sodium: 140 mmol/L (ref 135–146)
Total Bilirubin: 0.3 mg/dL (ref 0.2–1.2)
Total Protein: 6.5 g/dL (ref 6.1–8.1)

## 2020-08-29 ENCOUNTER — Encounter: Payer: No Typology Code available for payment source | Admitting: Physical Therapy

## 2020-08-29 ENCOUNTER — Other Ambulatory Visit: Payer: Self-pay

## 2020-08-29 ENCOUNTER — Ambulatory Visit: Payer: PRIVATE HEALTH INSURANCE | Admitting: Physical Therapy

## 2020-08-29 ENCOUNTER — Encounter: Payer: Self-pay | Admitting: Physical Therapy

## 2020-08-29 DIAGNOSIS — M25671 Stiffness of right ankle, not elsewhere classified: Secondary | ICD-10-CM

## 2020-08-29 DIAGNOSIS — M25571 Pain in right ankle and joints of right foot: Secondary | ICD-10-CM

## 2020-08-29 DIAGNOSIS — M6281 Muscle weakness (generalized): Secondary | ICD-10-CM

## 2020-08-29 NOTE — Progress Notes (Signed)
LMTCB to  let patient know that I scheduled mammogram 5/26 @ 2:20p.

## 2020-08-29 NOTE — Therapy (Signed)
[facility] 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 Evaluation plus 8 visits. [facility] [id number]    Authorization - Visit Number 6    Authorization - Number of Visits 9    Progress Note Due on Visit 9    PT Start Time 1909    PT Stop Time 1947    PT Time Calculation (min) 38 min    Activity Tolerance Patient tolerated treatment well;Patient limited by pain    Behavior During Therapy [facility] for tasks assessed/performed           Past Medical History:  Diagnosis Date  . Chicken pox   . Diabetes mellitus without complication (HCC)   . Herpes   . Hypertension     Past Surgical History:  Procedure Laterality Date  . CHOLECYSTECTOMY  2015  . HERNIA REPAIR     55 years old  . TUBAL LIGATION  1991    There were no vitals filed for this visit.   Subjective Assessment - 08/29/20 1913    Subjective Patient report her right foot pain is 3/10 upon arrival. She worked 4 hours this morning and is going back for another 6 hour shift tonight. States she felt okay after last session. HEP is going okay. She was able to complete them after modifications at last PT session.    Pertinent History Patient is a 55 y.o. female who presents to outpatient physical therapy with a referral for medical diagnosis right ankle pain. This patient's chief complaints consist of work related injury 05/02/2020 leading to R ankle pain and swelling leading to  the following functional deficits: difficulty with usual activities such as working (currenlty on light duty due to injury), prolonged weight bearing (walking/standing), stairs, walking, housework, sleeping, community activities that require standing such as shopping, church activities. Relevant past medical history and comorbidities include diabetes, high blood pressure, GERD, cholecystectomy, hernia repair (no longer bothers her). Patient denies hx of cancer, stroke, seizures, lung problem, major cardiac events, unexplained weight loss, changes in bowel or bladder problems, new onset stumbling or dropping things.    Diagnostic tests MRI report of R ankle 06/26/2020: "IMPRESSION:  1. Bone marrow edema in the posterior lateral tibial plafond  adjacent talus with adjacent soft tissue edema which may reflect  osseous contusion secondary to direct trauma versus secondary to  posterior ankle impingement syndrome.  2. Anterior tibiofibular ligament is thickened and irregular  consistent with prior injury."    Currently in Pain? Yes    Pain Score 3     Pain Location Ankle    Pain Orientation Right              OBJECTIVE FOTO = 57 (08/25/20) TREATMENT: - not sensitive to latex  Therapeutic exercise:to centralize symptoms and improve ROM, strength, muscular endurance, and activity tolerance required for successful completion of functional activities. -Seated left ankle BAPS board, level 3, plantarflexion/dorsiflexion, inversion/eversion, circles clockwise,  circles counter clockwise, x20 reps each plus time for instruction and transitions. Pt required cuing to keep knee still and instruction on how to perform exercise -leaning towards wall ankle DF (in sync) 2x15 bilat  -Tandem stance airex foam 2x30sec bilat  - side stepping with yellow theraband looped around great toes (working eversion and hip abduction), 1x10 feet each direction.  -Narrow stance airex pad with trunk rotation holding 3kg med  ball 1x20 bilat  Pt required multimodal cuing for proper technique and to facilitate improved neuromuscular control, strength, range of motion, and functional ability resulting in improved performance and form.  HOME EXERCISE PROGRAM Access Code: [email or web address] URL: [email or web address] Date: 08/23/2020 Prepared by: Norton Blizzard  Exercises Seated Figure 4 Ankle Inversion with Resistance - 1 x daily - 1 sets - 20 reps - 2 seconds hold Seated Ankle Eversion with Resistance - 1 x daily - 1 sets - 20 reps - 2 seconds hold Single Leg Stance - 1 x daily - 2 reps - 30 seconds hold  [email or web address] CP [id number] Home Exercise Program [[id number]]  Foot supination control with elastic band wrap - Repeat 20 Times, Hold 1 Second(s), Complete 2 Sets, Perform 1 Times a Day  Heel raise (seated or standing as tolerated)  - Repeat 20 Times, Hold 1 Second(s), Complete 2 Sets, Perform 1 Times a Day  GREAT TOE FLEXION WITH BAND - Repeat 20 Times, Hold 1 Second(s), Complete 2 Sets, Perform 1 Times a Day  TOE FLEXION WITH BAND: TOES 2-5 - Repeat 20 Times, Hold 1 Second(s), Complete 2 Sets, Perform 1 Times a Day    PT Education - 08/29/20 1914    Education Details Exercise purpose/form. Self management techniques    Person(s) Educated Patient    Methods Explanation;Demonstration;Tactile cues;Verbal cues    Comprehension Verbalized understanding;Returned demonstration;Verbal cues required;Tactile cues required;Need further instruction            PT Short Term Goals - 08/01/20 1945      PT SHORT TERM GOAL #1   Title Be independent with initial home exercise program for self-management of symptoms.    Baseline Initial HEP provided at IE (08/01/2020);    Time 2    Period Weeks    Status New    Target Date 08/15/20             PT Long Term Goals - 08/01/20 1946      PT LONG TERM GOAL #1   Title Be independent with a long-term home exercise program for self-management of  symptoms.    Baseline initial HEP provided at IE (08/01/2020);    Time 12    Period Weeks    Status New   TARGET DATE FOR ALL LONG TERM GOALS: 10/24/2020     PT LONG TERM GOAL #2   Title Demonstrate improved FOTO score equal or greater than 66 by visit # 14 to demonstrate improvement in overall condition and self-reported functional ability.    Baseline 60 (08/01/2020);    Time 12    Period Weeks    Status New      PT LONG TERM GOAL #3   Title Patient will demonstrate R ankle A/PROM equal or greater than L ankle A/PROM with no increase in pain to improve abliity to complete work activities without pain or restrictions.    Baseline R ankle lacking and painful - see objective exam (08/01/2020);    Time 12    Period Weeks    Status New  PT LONG TERM GOAL #4   Title Improve R ankle strength equal or greater to L ankle strength without increased pain to improve abliity to complete work activities without pain or restrictions.    Baseline painful and weak - see objective exam (08/01/2020);    Time 12    Period Weeks    Status New      PT LONG TERM GOAL #5   Title Complete community, work and/or recreational activities without limitation due to current condition    Baseline Functional Limitations: difficulty with usual activities such as working (currenlty on light duty due to injury), prolonged weight bearing (walking/standing), stairs, walking, housework, sleeping, community activities that require standing such as shopping, church activities (08/01/2020);    Time 12    Period Weeks    Status New                 Plan - 08/29/20 2001    Clinical Impression Statement Patient tolerated treatment well overall but did report some discomfort with side stepping. Continues to report sensation her ankle needs to pop with continued swelling and burning. Patient would benefit from advancing proprioception exercises next session.  Patient would benefit from continued management of limiting  condition by skilled physical therapist to address remaining impairments and functional limitations to work towards stated goals and return to PLOF or maximal functional independence.    Personal Factors and Comorbidities Age;Comorbidity 3+    Comorbidities diabetes, high blood pressure, GERD, cholecystectomy    Examination-Activity Limitations Stairs;Stand;Locomotion Level;[facility] [employer]    Examination-Participation Restrictions Interpersonal Relationship;Occupation;Yard Work;Community Activity;Volunteer;Shop;Cleaning    Stability/Clinical Decision Making Stable/Uncomplicated    Rehab Potential Good    PT Frequency 2x / week    PT Duration 12 weeks    PT Treatment/Interventions ADLs/Self Care Home Management;Aquatic Therapy;Cryotherapy;Moist Heat;Electrical Stimulation;Gait training;Stair training;Functional mobility training;Therapeutic activities;Therapeutic exercise;Balance training;Neuromuscular re-education;Manual techniques;Dry needling;Passive range of motion;Spinal Manipulations;Joint Manipulations;Patient/family education    PT Next Visit Plan tolerated ankle strengthening, proprioception    PT Home Exercise Plan Medbridge  Access Code: [email or web address]    Consulted and Agree with Plan of Care Patient           Patient will benefit from skilled therapeutic intervention in order to improve the following deficits and impairments:  Abnormal gait,Improper body mechanics,Pain,Decreased coordination,Decreased mobility,Decreased activity tolerance,Decreased endurance,Decreased range of motion,Decreased strength,Hypomobility,Impaired perceived functional ability,Impaired flexibility,Increased edema,Difficulty walking,Decreased balance  Visit Diagnosis: Right ankle pain, unspecified chronicity  Muscle weakness (generalized)  Stiffness of right ankle, not elsewhere classified     Problem List Patient Active Problem List   Diagnosis Date Noted  . Right ear impacted cerumen 04/17/2020   . Right foot pain 01/12/2020  . Hyperlipidemia 05/05/2018  . Menorrhagia with regular cycle 12/25/2017  . Suprapubic pain 12/15/2015  . GERD (gastroesophageal reflux disease) 11/01/2015  . Diarrhea 05/19/2015  . Routine physical examination 02/20/2015  . Diabetes mellitus type 2, controlled, without complications (HCC) 02/20/2015  . Benign essential HTN 02/20/2015  . Herpes simplex 02/20/2015    Luretha Murphy. Ilsa Iha, PT, DPT 08/29/20, 8:02 PM  [facility] SPOR[facility]ICINE [address] [place], [address] Phone: [phone]   Fax:  [phone]  Name: REY FORS MRN: [id number] Date of Birth: Aug 15, 1965

## 2020-08-31 ENCOUNTER — Other Ambulatory Visit: Payer: Self-pay

## 2020-08-31 ENCOUNTER — Encounter: Payer: Self-pay | Admitting: Physical Therapy

## 2020-08-31 ENCOUNTER — Ambulatory Visit: Payer: PRIVATE HEALTH INSURANCE | Admitting: Physical Therapy

## 2020-08-31 DIAGNOSIS — M25671 Stiffness of right ankle, not elsewhere classified: Secondary | ICD-10-CM

## 2020-08-31 DIAGNOSIS — M25571 Pain in right ankle and joints of right foot: Secondary | ICD-10-CM

## 2020-08-31 DIAGNOSIS — M6281 Muscle weakness (generalized): Secondary | ICD-10-CM

## 2020-08-31 NOTE — Therapy (Signed)
[facility] [address], [place], [address] Phone: [phone]   Fax:  [phone]  Physical Therapy Treatment  Patient Details  Name: Cassandra [name] MRN: [id number] Date of Birth: 05/27/1965 Referring Provider (PT): Nicki Guadalajara, MD   Encounter Date: 08/31/2020   PT End of Session - 08/31/20 1657    Visit Number 7    Number of Visits 24    Date for PT Re-Evaluation 10/24/20    Authorization Type [facility] [facility] (Work Comp) reporting period from 08/01/2020    Authorization Time Period Worker's Comp Quarry manager by Venida Jarvis, RN. Evaluation plus 8 visits. [facility] [id number]    Authorization - Visit Number 7    Authorization - Number of Visits 9    Progress Note Due on Visit 9    PT Start Time 1655    PT Stop Time 1733    PT Time Calculation (min) 38 min    Activity Tolerance Patient tolerated treatment well;Patient limited by pain    Behavior During Therapy [facility] for tasks assessed/performed           Past Medical History:  Diagnosis Date  . Chicken pox   . Diabetes mellitus without complication (HCC)   . Herpes   . Hypertension     Past Surgical History:  Procedure Laterality Date  . CHOLECYSTECTOMY  2015  . HERNIA REPAIR     55 years old  . TUBAL LIGATION  1991    There were no vitals filed for this visit.   Subjective Assessment - 08/31/20 1656    Subjective Patient reports she is having a lot of burning today and her pain is worse over the right foot. Rates it 5/10 upon arrival and tried to sit down a lot at work. Was of yesterday and does not know why her ankle is feeling worse today.    Pertinent History Patient is a 55 y.o. female who presents to outpatient physical therapy with a referral for medical diagnosis right ankle pain. This patient's chief complaints consist of work related injury 05/02/2020 leading to R ankle pain and swelling leading to the following functional deficits:  difficulty with usual activities such as working (currenlty on light duty due to injury), prolonged weight bearing (walking/standing), stairs, walking, housework, sleeping, community activities that require standing such as shopping, church activities. Relevant past medical history and comorbidities include diabetes, high blood pressure, GERD, cholecystectomy, hernia repair (no longer bothers her). Patient denies hx of cancer, stroke, seizures, lung problem, major cardiac events, unexplained weight loss, changes in bowel or bladder problems, new onset stumbling or dropping things.    Diagnostic tests MRI report of R ankle 06/26/2020: "IMPRESSION:  1. Bone marrow edema in the posterior lateral tibial plafond  adjacent talus with adjacent soft tissue edema which may reflect  osseous contusion secondary to direct trauma versus secondary to  posterior ankle impingement syndrome.  2. Anterior tibiofibular ligament is thickened and irregular  consistent with prior injury."    Currently in Pain? Yes    Pain Score 5               OBJECTIVE FOTO = 57 (08/25/20)  TREATMENT: - not sensitive to latex  Therapeutic exercise:to centralize symptoms and improve ROM, strength, muscular endurance, and activity tolerance required for successful completion of functional activities. -Seated right ankle BAPS board, level3, circles clockwise, circles counter clockwise,x20 repseach.  -Tandem stance airex foam 2x30sec each side - forward and backwards  rocks on [employer] with touchdown UE support as needed, bilateral stance. 1x20 each direction. - side stepping with yellow theraband looped around great toes (working eversion and hip abduction), 1x10 feet each direction.  - standing single leg cone taps with fingers, 1x5 each direction with UE support when standing on R LE. Discontinued due to being too difficult standing on right side.  - SLS with lateral toe slide on furniture slider, UE support as needed,  1x10 each side. (difficulty PF right ankle when it is on slider).  - SLS with circles with other foot on furnature slider, UE support as needed, 2x10 each side.  - tandem walking forwards/backwards on 5 foot aeromat, 1x5 each direction.  -Narrow stance airex pad with trunk rotationholding 3kg med ball1x20 each way.  Pt required multimodal cuing for proper technique and to facilitate improved neuromuscular control, strength, range of motion, and functional ability resulting in improved performance and form.  HOME EXERCISE PROGRAM Access Code: [email or web address] URL: [email or web address] Date: 08/23/2020 Prepared by: Norton Blizzard  Exercises Seated Figure 4 Ankle Inversion with Resistance - 1 x daily - 1 sets - 20 reps - 2 seconds hold Seated Ankle Eversion with Resistance - 1 x daily - 1 sets - 20 reps - 2 seconds hold Single Leg Stance - 1 x daily - 2 reps - 30 seconds hold  [email or web address] CP [id number] Home Exercise Program [[id number]]  Foot supination control with elastic band wrap - Repeat 20 Times, Hold 1 Second(s), Complete 2 Sets, Perform 1 Times a Day  Heel raise(seated or standing as tolerated)- Repeat 20 Times, Hold 1 Second(s), Complete 2 Sets, Perform 1 Times a Day  GREAT TOE FLEXION WITH BAND - Repeat 20 Times, Hold 1 Second(s), Complete 2 Sets, Perform 1 Times a Day  TOE FLEXION WITH BAND: TOES 2-5 - Repeat 20 Times, Hold 1 Second(s), Complete 2 Sets, Perform 1 Times a Day     PT Education - 08/31/20 1932    Education Details Exercise purpose/form. Self management techniques    Person(s) Educated Patient    Methods Explanation;Demonstration;Tactile cues;Verbal cues    Comprehension Verbalized understanding;Returned demonstration;Verbal cues required;Tactile cues required;Need further instruction            PT Short Term Goals - 08/01/20 1945      PT SHORT TERM GOAL #1   Title Be independent with initial home exercise program for self-management  of symptoms.    Baseline Initial HEP provided at IE (08/01/2020);    Time 2    Period Weeks    Status New    Target Date 08/15/20             PT Long Term Goals - 08/01/20 1946      PT LONG TERM GOAL #1   Title Be independent with a long-term home exercise program for self-management of symptoms.    Baseline initial HEP provided at IE (08/01/2020);    Time 12    Period Weeks    Status New   TARGET DATE FOR ALL LONG TERM GOALS: 10/24/2020     PT LONG TERM GOAL #2   Title Demonstrate improved FOTO score equal or greater than 66 by visit # 14 to demonstrate improvement in overall condition and self-reported functional ability.    Baseline 60 (08/01/2020);    Time 12    Period Weeks    Status New      PT LONG TERM GOAL #3   Title Patient will demonstrate R ankle A/PROM  equal or greater than L ankle A/PROM with no increase in pain to improve abliity to complete work activities without pain or restrictions.    Baseline R ankle lacking and painful - see objective exam (08/01/2020);    Time 12    Period Weeks    Status New      PT LONG TERM GOAL #4   Title Improve R ankle strength equal or greater to L ankle strength without increased pain to improve abliity to complete work activities without pain or restrictions.    Baseline painful and weak - see objective exam (08/01/2020);    Time 12    Period Weeks    Status New      PT LONG TERM GOAL #5   Title Complete community, work and/or recreational activities without limitation due to current condition    Baseline Functional Limitations: difficulty with usual activities such as working (currenlty on light duty due to injury), prolonged weight bearing (walking/standing), stairs, walking, housework, sleeping, community activities that require standing such as shopping, church activities (08/01/2020);    Time 12    Period Weeks    Status New                 Plan - 08/31/20 1935    Clinical Impression Statement Patient presented  with elevated pain today. Continues to describe as burning pain at lateral R ankle with "crook" feeling like it needs to pop. Modified exercises as needed so as not to purposefully worsen pain. Patient was able to transition to more standing exercises and actually founding weight bearing on the R LE less painful/difficult than AROM into plantar flexion of the R ankle. Encouraged patient to continue practicing single leg stance balance at home. Does demonstrate decreased proprioception and balance on R LE compared to L LE. Patient's pain is continuing to be elevated beyond what is expected at [facility] point. Patient would benefit from continued management of limiting condition by skilled physical therapist to address remaining impairments and functional limitations to work towards stated goals and return to PLOF or maximal functional independence.    Personal Factors and Comorbidities Age;Comorbidity 3+    Comorbidities diabetes, high blood pressure, GERD, cholecystectomy    Examination-Activity Limitations Stairs;Stand;Locomotion Level;Caring for [employer]    Examination-Participation Restrictions Interpersonal Relationship;Occupation;Yard Work;Community Activity;Volunteer;Shop;Cleaning    Stability/Clinical Decision Making Stable/Uncomplicated    Rehab Potential Good    PT Frequency 2x / week    PT Duration 12 weeks    PT Treatment/Interventions ADLs/Self Care Home Management;Aquatic Therapy;Cryotherapy;Moist Heat;Electrical Stimulation;Gait training;Stair training;Functional mobility training;Therapeutic activities;Therapeutic exercise;Balance training;Neuromuscular re-education;Manual techniques;Dry needling;Passive range of motion;Spinal Manipulations;Joint Manipulations;Patient/family education    PT Next Visit Plan tolerated ankle strengthening, proprioception    PT Home Exercise Plan Medbridge  Access Code: [email or web address]    Consulted and Agree with Plan of Care Patient           Patient will  benefit from skilled therapeutic intervention in order to improve the following deficits and impairments:  Abnormal gait,Improper body mechanics,Pain,Decreased coordination,Decreased mobility,Decreased activity tolerance,Decreased endurance,Decreased range of motion,Decreased strength,Hypomobility,Impaired perceived functional ability,Impaired flexibility,Increased edema,Difficulty walking,Decreased balance  Visit Diagnosis: Right ankle pain, unspecified chronicity  Muscle weakness (generalized)  Stiffness of right ankle, not elsewhere classified     Problem List Patient Active Problem List   Diagnosis Date Noted  . Right ear impacted cerumen 04/17/2020  . Right foot pain 01/12/2020  . Hyperlipidemia 05/05/2018  . Menorrhagia with regular cycle 12/25/2017  . Suprapubic pain 12/15/2015  . GERD (  gastroesophageal reflux disease) 11/01/2015  . Diarrhea 05/19/2015  . Routine physical examination 02/20/2015  . Diabetes mellitus type 2, controlled, without complications (HCC) 02/20/2015  . Benign essential HTN 02/20/2015  . Herpes simplex 02/20/2015   Luretha MurphySara R. Ilsa IhaSnyder, PT, DPT 08/31/20, 7:36 PM  [facility] [address] [place], [place], [address] Phone: [phone]   Fax:  [phone]  Name: Burnis KingfisherCassandra D Matar MRN: [id number] Date of Birth: 1965/10/05

## 2020-08-31 NOTE — Progress Notes (Signed)
I called back & LM with date, time & [place] number in case she needed to cancel. Have not been able to reach patient.

## 2020-09-01 ENCOUNTER — Encounter: Payer: No Typology Code available for payment source | Admitting: Physical Therapy

## 2020-09-05 ENCOUNTER — Ambulatory Visit: Payer: PRIVATE HEALTH INSURANCE | Admitting: Physical Therapy

## 2020-09-05 ENCOUNTER — Encounter: Payer: No Typology Code available for payment source | Admitting: Physical Therapy

## 2020-09-05 ENCOUNTER — Other Ambulatory Visit: Payer: Self-pay

## 2020-09-05 DIAGNOSIS — M25571 Pain in right ankle and joints of right foot: Secondary | ICD-10-CM | POA: Diagnosis not present

## 2020-09-05 DIAGNOSIS — M6281 Muscle weakness (generalized): Secondary | ICD-10-CM

## 2020-09-05 DIAGNOSIS — M25671 Stiffness of right ankle, not elsewhere classified: Secondary | ICD-10-CM

## 2020-09-05 NOTE — Therapy (Signed)
[facility] [address], [place], [address] Phone: [phone]   Fax:  [phone]  Physical Therapy Treatment  Patient Details  Name: Cassandra [name] MRN: [id number] Date of Birth: 17-Sep-1965 Referring Provider (PT): Nicki Guadalajara, MD   Encounter Date: 09/05/2020   PT End of Session - 09/05/20 1837    Visit Number 8    Number of Visits 24    Date for PT Re-Evaluation 10/24/20    Authorization Type [facility] [facility] (Work Comp) reporting period from 08/01/2020    Authorization Time Period Worker's Comp Quarry manager by Venida Jarvis, RN. Evaluation plus 8 visits. [facility] [id number]    Authorization - Visit Number 8    Authorization - Number of Visits 9    Progress Note Due on Visit 9    PT Start Time 1735    PT Stop Time 1815    PT Time Calculation (min) 40 min    Activity Tolerance Patient tolerated treatment well;Patient limited by pain    Behavior During Therapy [facility] for tasks assessed/performed           Past Medical History:  Diagnosis Date  . Chicken pox   . Diabetes mellitus without complication (HCC)   . Herpes   . Hypertension     Past Surgical History:  Procedure Laterality Date  . CHOLECYSTECTOMY  2015  . HERNIA REPAIR     55 years old  . TUBAL LIGATION  1991    There were no vitals filed for this visit.   Subjective Assessment - 09/05/20 1735    Subjective Patient reports her right ankle is less swollen today and pain is 3/10 buring with a "crook" feeling at the R anteriolateral ankle. States she felt okay after last session.    Pertinent History Patient is a 55 y.o. female who presents to outpatient physical therapy with a referral for medical diagnosis right ankle pain. This patient's chief complaints consist of work related injury 05/02/2020 leading to R ankle pain and swelling leading to the following functional deficits: difficulty with usual activities such as working (currenlty on  light duty due to injury), prolonged weight bearing (walking/standing), stairs, walking, housework, sleeping, community activities that require standing such as shopping, church activities. Relevant past medical history and comorbidities include diabetes, high blood pressure, GERD, cholecystectomy, hernia repair (no longer bothers her). Patient denies hx of cancer, stroke, seizures, lung problem, major cardiac events, unexplained weight loss, changes in bowel or bladder problems, new onset stumbling or dropping things.    Diagnostic tests MRI report of R ankle 06/26/2020: "IMPRESSION:  1. Bone marrow edema in the posterior lateral tibial plafond  adjacent talus with adjacent soft tissue edema which may reflect  osseous contusion secondary to direct trauma versus secondary to  posterior ankle impingement syndrome.  2. Anterior tibiofibular ligament is thickened and irregular  consistent with prior injury."    Currently in Pain? Yes    Pain Score 3     Pain Location Ankle    Pain Orientation Right    Pain Descriptors / Indicators Burning           OBJECTIVE FOTO = 57 (08/25/20)  TREATMENT: - not sensitive to latex  Therapeutic exercise:to centralize symptoms and improve ROM, strength, muscular endurance, and activity tolerance required for successful completion of functional activities. - seated flat foot slide on sagittal plane with foot on furnature slider, 1x20 R foot  - seated dorsiflexion from max tolerated dorsiflexion,  2x20 (pateint feels this is stretching).  - standing dorsiflexion with buttocks leaning on wall 1x5 R ankle (pateint able to perform and states she does not feel it is addressing the correct region, discontinued).  - standing lateral foot slide on slider, 3x20 each side with touch down UE support as needed.  - standing reverse lunge with R foot back on slider, 1x5 (pateint reports it does not affect the region of pain, discontinued) - standing forward slide with R foot  on slider with L knee fleixon and UE support, 1x20 (pateint feels stretch in ankle but prefers lateral slide) - Education on HEP including handout   Manual therapy: to reduce pain and tissue tension, improve range of motion, neuromodulation, in order to promote improved ability to complete functional activities. - seated R ankle distraction at subtalar and talocrual joint, 2x30 seconds each location (well tolerated in dorsiflexion).  - seated R subtalar lateral to medial and medial to lateral glide, grade III-IV, 1 x30 seconds each way.  - seated right ankle AP glide at talocrual joint, grade II-IV, 1x30 seconds.  - long sitting generalized distraction through R ankle, grade II-V, with no cavitation.  - long sitting R ankle distraction with grip over dorsal aspect of foot, grade III-IV, x 20 seconds, significant increase in concordant pain and burning.  - Testing with tuning fork over bones of R ankle and foot compared to left (pt reports mildly decreased sensation at R compared to L, results do not implicate fracture).   Pt required multimodal cuing for proper technique and to facilitate improved neuromuscular control, strength, range of motion, and functional ability resulting in improved performance and form.  HOME EXERCISE PROGRAM Access Code: [email or web address] URL: [email or web address] Date: 08/23/2020 Prepared by: Norton Blizzard  Exercises Seated Figure 4 Ankle Inversion with Resistance - 1 x daily - 1 sets - 20 reps - 2 seconds hold Seated Ankle Eversion with Resistance - 1 x daily - 1 sets - 20 reps - 2 seconds hold Single Leg Stance - 1 x daily - 2 reps - 30 seconds hold  [email or web address] CP [id number] Home Exercise Program [[id number]]  SEATED HEEL SLIDES WITH TOWEL -  Repeat 20 Times, Complete 2 Sets, Perform 3 Times a Week  TOES RAISES - DORSIFLEXION - UNILATERAL -  Repeat 20 Times, Complete 2 Sets, Perform 3 Times a Week  Slider Disc Lunge - Lateral -  Repeat 20 Times,  Complete 2 Sets, Perform 3 Times a Week    PT Education - 09/05/20 1837    Education Details Exercise purpose/form. Self management techniques    Person(s) Educated Patient    Methods Explanation;Demonstration;Tactile cues;Verbal cues;Handout    Comprehension Verbalized understanding;Returned demonstration;Verbal cues required;Tactile cues required;Need further instruction            PT Short Term Goals - 09/05/20 1837      PT SHORT TERM GOAL #1   Title Be independent with initial home exercise program for self-management of symptoms.    Baseline Initial HEP provided at IE (08/01/2020);    Time 2    Period Weeks    Status Achieved    Target Date 08/15/20             PT Long Term Goals - 08/01/20 1946      PT LONG TERM GOAL #1   Title Be independent with a long-term home exercise program for self-management of symptoms.    Baseline initial HEP provided at IE (08/01/2020);    Time 12  Period Weeks    Status New   TARGET DATE FOR ALL LONG TERM GOALS: 10/24/2020     PT LONG TERM GOAL #2   Title Demonstrate improved FOTO score equal or greater than 66 by visit # 14 to demonstrate improvement in overall condition and self-reported functional ability.    Baseline 60 (08/01/2020);    Time 12    Period Weeks    Status New      PT LONG TERM GOAL #3   Title Patient will demonstrate R ankle A/PROM equal or greater than L ankle A/PROM with no increase in pain to improve abliity to complete work activities without pain or restrictions.    Baseline R ankle lacking and painful - see objective exam (08/01/2020);    Time 12    Period Weeks    Status New      PT LONG TERM GOAL #4   Title Improve R ankle strength equal or greater to L ankle strength without increased pain to improve abliity to complete work activities without pain or restrictions.    Baseline painful and weak - see objective exam (08/01/2020);    Time 12    Period Weeks    Status New      PT LONG TERM GOAL #5    Title Complete community, work and/or recreational activities without limitation due to current condition    Baseline Functional Limitations: difficulty with usual activities such as working (currenlty on light duty due to injury), prolonged weight bearing (walking/standing), stairs, walking, housework, sleeping, community activities that require standing such as shopping, church activities (08/01/2020);    Time 12    Period Weeks    Status New                 Plan - 09/05/20 1837    Clinical Impression Statement Patient with decreased swelling and pain this session compared to last. Included exercises to improve ROM and patient really liked exercises sliding foot on floor to stretch limited ROM gently to tolerance. Continues to report it feels like it needs to pop but all manual techniques to assist with this unsuccessful. Continues to be exceptionally tender to right plantar flexion and pressure over dorsal aspect of foot. No suggestion of fracture with tuning fork screen. Patient would benefit from continued management of limiting condition by skilled physical therapist to address remaining impairments and functional limitations to work towards stated goals and return to PLOF or maximal functional independence.    Personal Factors and Comorbidities Age;Comorbidity 3+    Comorbidities diabetes, high blood pressure, GERD, cholecystectomy    Examination-Activity Limitations Stairs;Stand;Locomotion Level;Caring for [employer];Sleep    Examination-Participation Restrictions Interpersonal Relationship;Occupation;Yard Work;Community Activity;Volunteer;Shop;Cleaning    Stability/Clinical Decision Making Stable/Uncomplicated    Rehab Potential Good    PT Frequency 2x / week    PT Duration 12 weeks    PT Treatment/Interventions ADLs/Self Care Home Management;Aquatic Therapy;Cryotherapy;Moist Heat;Electrical Stimulation;Gait training;Stair training;Functional mobility training;Therapeutic  activities;Therapeutic exercise;Balance training;Neuromuscular re-education;Manual techniques;Dry needling;Passive range of motion;Spinal Manipulations;Joint Manipulations;Patient/family education    PT Next Visit Plan tolerated ankle strengthening, proprioception    PT Home Exercise Plan Medbridge  Access Code: [email or web address]    Consulted and Agree with Plan of Care Patient           Patient will benefit from skilled therapeutic intervention in order to improve the following deficits and impairments:  Abnormal gait,Improper body mechanics,Pain,Decreased coordination,Decreased mobility,Decreased activity tolerance,Decreased endurance,Decreased range of motion,Decreased strength,Hypomobility,Impaired perceived functional ability,Impaired flexibility,Increased edema,Difficulty walking,Decreased balance  Visit  Diagnosis: Right ankle pain, unspecified chronicity  Muscle weakness (generalized)  Stiffness of right ankle, not elsewhere classified     Problem List Patient Active Problem List   Diagnosis Date Noted  . Right ear impacted cerumen 04/17/2020  . Right foot pain 01/12/2020  . Hyperlipidemia 05/05/2018  . Menorrhagia with regular cycle 12/25/2017  . Suprapubic pain 12/15/2015  . GERD (gastroesophageal reflux disease) 11/01/2015  . Diarrhea 05/19/2015  . Routine physical examination 02/20/2015  . Diabetes mellitus type 2, controlled, without complications (HCC) 02/20/2015  . Benign essential HTN 02/20/2015  . Herpes simplex 02/20/2015   Luretha Murphy. Ilsa Iha, PT, DPT 09/05/20, 6:38 PM  [facility] [address] [place], [address] Phone: [phone]   Fax:  [phone]  Name: JULIONA VALES MRN: [id number] Date of Birth: May 21, 1965

## 2020-09-07 ENCOUNTER — Ambulatory Visit: Payer: PRIVATE HEALTH INSURANCE | Admitting: Physical Therapy

## 2020-09-08 ENCOUNTER — Ambulatory Visit
Admission: RE | Admit: 2020-09-08 | Discharge: 2020-09-08 | Disposition: A | Discharge status: Home / Self Care | Payer: No Typology Code available for payment source | Source: Ambulatory Visit | Attending: Family | Admitting: Family

## 2020-09-08 ENCOUNTER — Other Ambulatory Visit: Payer: Self-pay

## 2020-09-08 ENCOUNTER — Encounter: Payer: No Typology Code available for payment source | Admitting: Physical Therapy

## 2020-09-08 DIAGNOSIS — Z1231 Encounter for screening mammogram for malignant neoplasm of breast: Secondary | ICD-10-CM | POA: Insufficient documentation

## 2020-09-08 MED FILL — Dulaglutide Soln Auto-injector 3 MG/0.5ML: SUBCUTANEOUS | 28 days supply | Qty: 2 | Fill #0 | Status: AC

## 2020-09-09 ENCOUNTER — Other Ambulatory Visit: Payer: Self-pay

## 2020-09-13 ENCOUNTER — Other Ambulatory Visit: Payer: Self-pay | Admitting: Family

## 2020-09-13 ENCOUNTER — Encounter: Payer: Self-pay | Admitting: Physical Therapy

## 2020-09-13 ENCOUNTER — Other Ambulatory Visit: Payer: Self-pay

## 2020-09-13 ENCOUNTER — Ambulatory Visit: Payer: PRIVATE HEALTH INSURANCE | Admitting: Physical Therapy

## 2020-09-13 DIAGNOSIS — M25571 Pain in right ankle and joints of right foot: Secondary | ICD-10-CM

## 2020-09-13 DIAGNOSIS — R928 Other abnormal and inconclusive findings on diagnostic imaging of breast: Secondary | ICD-10-CM

## 2020-09-13 DIAGNOSIS — M25671 Stiffness of right ankle, not elsewhere classified: Secondary | ICD-10-CM

## 2020-09-13 DIAGNOSIS — N6310 Unspecified lump in the right breast, unspecified quadrant: Secondary | ICD-10-CM

## 2020-09-13 DIAGNOSIS — M6281 Muscle weakness (generalized): Secondary | ICD-10-CM

## 2020-09-13 NOTE — Therapy (Signed)
[facility] [address] [place], [address] Phone: [phone]   Fax:  [phone]  Physical Therapy Treatment / Progress Note Dates of reporting: 08/01/2020 - 09/13/2020  Patient Details  Name: Cassandra [name] MRN: [id number] Date of Birth: 1965-11-04 Referring Provider (PT): Radene Journey, MD   Encounter Date: 09/13/2020   PT End of Session - 09/13/20 1936    Visit Number 9    Number of Visits 24    Date for PT Re-Evaluation 10/24/20    Authorization Type [employer] (Work Comp) reporting period from 08/01/2020    Authorization Time Period Worker's Comp Information systems manager by Sydnee Levans, RN. Evaluation plus 8 visits. [place] [id number]    Authorization - Visit Number 9    Authorization - Number of Visits 9    Progress Note Due on Visit 9    PT Start Time 1815    PT Stop Time 1900    PT Time Calculation (min) 45 min    Activity Tolerance Patient tolerated treatment well;Patient limited by pain    Behavior During Therapy [facility] for tasks assessed/performed           Past Medical History:  Diagnosis Date  . Chicken pox   . Diabetes mellitus without complication ([place])   . Herpes   . Hypertension     Past Surgical History:  Procedure Laterality Date  . CHOLECYSTECTOMY  2015  . HERNIA REPAIR     55 years old  . TUBAL LIGATION  1991    There were no vitals filed for this visit.   Subjective Assessment - 09/13/20 1815    Subjective Patient reports her pain continues to be similar and is nagging/burning. Current pain is 3/10. Pain was about 4-5/10 after her last PT session. She is performing her HEP as prescribed and likes the new ones. She saw her referring physician since last treatment session who said he thought her pain was related to a nerve and wanted to do a nerve conduction study. He was quite concerned about weakness in eversion. Patient has not heard from adjuster yet about if this will be  scheduled. This is her last authorized PT session but she states Dr. Lucia Gaskins wanted her to continue PT. She has felt that PT was not particiularly helpful at first but lately has felt it may be making a difference because it was painful. Is unable to think of examples presently of anything that has improved. Patient states she is to see Dr. Lucia Gaskins in about a month.    Pertinent History Patient is a 55 y.o. female who presents to outpatient physical therapy with a referral for medical diagnosis right ankle pain. This patient's chief complaints consist of work related injury 05/02/2020 leading to R ankle pain and swelling leading to the following functional deficits: difficulty with usual activities such as working (currenlty on light duty due to injury), prolonged weight bearing (walking/standing), stairs, walking, housework, sleeping, community activities that require standing such as shopping, church activities. Relevant past medical history and comorbidities include diabetes, high blood pressure, GERD, cholecystectomy, hernia repair (no longer bothers her). Patient denies hx of cancer, stroke, seizures, lung problem, major cardiac events, unexplained weight loss, changes in bowel or bladder problems, new onset stumbling or dropping things.    Diagnostic tests MRI report of R ankle 06/26/2020: "IMPRESSION:  1. Bone marrow edema in the posterior lateral tibial plafond  adjacent talus with adjacent soft tissue edema which may reflect  osseous contusion secondary to direct trauma versus secondary to  posterior ankle impingement syndrome.  2. Anterior tibiofibular ligament is thickened and irregular  consistent with prior injury."    Patient Stated Goals get better    Currently in Pain? Yes    Pain Score 3     Pain Location --   over R ATFL   Pain Orientation Right    Pain Descriptors / Indicators Burning    Pain Type Chronic pain    Pain Onset More than a month ago    Pain Frequency Constant    Effect of Pain  on Daily Activities Functional Limitations: difficulty with usual activities such as working (currenlty on light duty due to injury), prolonged weight bearing (walking/standing), stairs, walking, housework, sleeping, community activities that require standing such as shopping, church activities            [place] = 36 (09/13/2020)  OBSERVATION/INSPECTION  Increased edema surrounding R lateral malleoulus, anterior > posterior. Holds ankle in slight inversion compared to left. Edema noted in dorsal aspect of forefoot on R compared to left.  Tremor: none  Muscle bulk: decreased muscle mass noted right lower leg compared to left.   Skin: appears WFL  Bed mobility: supine <> sit WFL  Transfers: sit <> stand I with mild guarding R LE (off weighting that side).   Gait: mild antalgic gait favoring R LE  ANTHROPOMETRIC Figure 8:  R = 54 cm, L = 54 cm   PERIPHERAL JOINT MOTION (in degrees) Active Range of Motion (AROM) *Indicates pain 08/01/20 09/13/20 Date  Joint/Motion R/L R/L R/L  Ankle/Foot     Dorsiflexion (knee ext) 5/5 10/ /  Plantarflexion 30*/45 22*/ /  [place] 0*/20 0/ /  Inversion 25/40 20/ /   Passive Range of Motion (PROM) *Indicates pain 08/01/20 09/13/20 Date  Joint/Motion R/L R/L R/L  Ankle/Foot     Dorsiflexion (knee ext) 15/15 15/ /  Plantarflexion  25*/   Eversion  /10*   Inversion  /35   Great toe extension 90/90 / /   MUSCLE PERFORMANCE (MMT):  *Indicates pain 08/01/20 09/13/20 Date  Joint/Motion R/L R/L R/L  Ankle/Foot     Dorsiflexion  5*/5 4+/5 /  Great toe extension 4+/4+ 4+/4+ /  Eversion 2*/4+ 3-/ /  Plantarflexion 4*/4 4/4+ /  Inversion 4*/4+ 3/4+ /  Pronation 4-*/4+ 3+/4+ /  Great toe flexion 4/5 4+/4+ /    ACCESSORY MOTION:   No excessive motion noted at talocrual or subtalar joint  Painful to distraction of talocrual and subtalar joint.   PALPATION:  Most TTP with concordant burn over ATFL. Also pain at  right anterior, distal posterior malleolus.   EDUCATION/COGNITION: Patient is alert and oriented X 4.      TREATMENT: - not sensitive to latex  Therapeutic exercise:to centralize symptoms and improve ROM, strength, muscular endurance, and activity tolerance required for successful completion of functional activities. - testing to assess progress (see above) - seated R ankle eversion slides with towel on slick surface, 3xlength of towel (increased burning).  - Education on HEP including handout   Pt required multimodal cuing for proper technique and to facilitate improved neuromuscular control, strength, range of motion, and functional ability resulting in improved performance and form.  HOME EXERCISE PROGRAM Access Code: [email or web address] URL: [email or web address] Date: 08/23/2020 Prepared by: Rosita Kea  Exercises Seated Figure 4 Ankle Inversion with Resistance - 1 x daily - 1 sets - 20 reps - 2 seconds hold Seated  Ankle Eversion with Resistance - 1 x daily - 1 sets - 20 reps - 2 seconds hold Single Leg Stance - 1 x daily - 2 reps - 30 seconds hold  [email or web address] CP [id number] Home Exercise Program [[id number]]  SEATED HEEL SLIDES WITH TOWEL -  Repeat 20 Times, Complete 2 Sets, Perform 3 Times a Week  TOES RAISES - DORSIFLEXION - UNILATERAL -  Repeat 20 Times, Complete 2 Sets, Perform 3 Times a Week  Slider Disc Lunge - Lateral -  Repeat 20 Times, Complete 2 Sets, Perform 3 Times a Week  TOWEL SLIDES - EVERSION -  Repeat 3 Times, Hold 1 Second(s), Complete 1 Set, Perform 1 Times a Day    PT Education - 09/13/20 1845    Education Details Exercise purpose/form. Self management techniques    Person(s) Educated Patient    Methods Explanation;Demonstration;Tactile cues;Verbal cues    Comprehension Verbalized understanding;Returned demonstration;Verbal cues required;Tactile cues required;Need further instruction            PT Short Term Goals - 09/05/20 1837       PT SHORT TERM GOAL #1   Title Be independent with initial home exercise program for self-management of symptoms.    Baseline Initial HEP provided at IE (08/01/2020);    Time 2    Period Weeks    Status Achieved    Target Date 08/15/20             PT Long Term Goals - 09/13/20 1926      PT LONG TERM GOAL #1   Title Be independent with a long-term home exercise program for self-management of symptoms.    Baseline initial HEP provided at IE (08/01/2020); Currently participating as tolerated in appropriate HEP (09/13/2020);    Time 12    Period Weeks    Status Partially Met   TARGET DATE FOR ALL LONG TERM GOALS: 10/24/2020     PT LONG TERM GOAL #2   Title Demonstrate improved FOTO score equal or greater than 66 by visit # 14 to demonstrate improvement in overall condition and self-reported functional ability.    Baseline 60 (08/01/2020); 57 (08/25/2020); 36 (09/13/2020);    Time 12    Period Weeks    Status New      PT LONG TERM GOAL #3   Title Patient will demonstrate R ankle A/PROM equal or greater than L ankle A/PROM with no increase in pain to improve abliity to complete work activities without pain or restrictions.    Baseline R ankle lacking and painful - see objective exam (08/01/2020); continues to be limited and painful (09/13/2020);    Time 12    Period Weeks    Status On-going      PT LONG TERM GOAL #4   Title Improve R ankle strength equal or greater to L ankle strength without increased pain to improve abliity to complete work activities without pain or restrictions.    Baseline painful and weak - see objective exam (08/01/2020); minimal change (09/13/2020);    Time 12    Period Weeks    Status On-going      PT LONG TERM GOAL #5   Title Complete community, work and/or recreational activities without limitation due to current condition    Baseline Functional Limitations: difficulty with usual activities such as working (currenlty on light duty due to injury), prolonged  weight bearing (walking/standing), stairs, walking, housework, sleeping, community activities that require standing such as shopping, church activities (08/01/2020); Patient reports limitations are  the same (09/13/2020);    Time 12    Period Weeks    Status On-going                 Plan - 09/13/20 1934    Clinical Impression Statement Patient has attended 9 physical therapy sessions this episode of care and has not made good progress towards goals except she continues to demonstrate good participation in short term and long term HEP. Patient continues to have burning pain over the ATFL region, limited ROM especially in ankle PF, inversion, and eversion, and significant strength loss in eversion. Per patient MD has recommended nerve conduction testing which PT is in agreement with. Patient has not improved as expected and would benefit from further medical assessment to determine if other routes of care are needed. Patient has demonstrated limited ability to respond to PT thus far but would benefit from continued PT as further investigation is completed in order to decrease deconditioning and further loss of function and to maximize return to prior level of function. She has been provided with HEP that she can currently tolerate well.    Personal Factors and Comorbidities Age;Comorbidity 3+    Comorbidities diabetes, high blood pressure, GERD, cholecystectomy    Examination-Activity Limitations Stairs;Stand;Locomotion Level;Caring for [employer]    Examination-Participation Restrictions Interpersonal Relationship;Occupation;Yard Work;Community Activity;Volunteer;Shop;Cleaning    Stability/Clinical Decision Making Stable/Uncomplicated    Rehab Potential Good    PT Frequency 2x / week    PT Duration 12 weeks    PT Treatment/Interventions ADLs/Self Care Home Management;Aquatic Therapy;Cryotherapy;Moist Heat;Electrical Stimulation;Gait training;Stair training;Functional mobility  training;Therapeutic activities;Therapeutic exercise;Balance training;Neuromuscular re-education;Manual techniques;Dry needling;Passive range of motion;Spinal Manipulations;Joint Manipulations;Patient/family education    PT Next Visit Plan If more visits authorized by work comp, plan to continue tolerated ankle strengthening, ROM, proprioception    PT Home Exercise Plan Medbridge  Access Code: [email or web address]    Recommended Other Services nerve conduction testing and further medical assessment due to lack of significant improvement thus far    Consulted and Agree with Plan of Care Patient           Patient will benefit from skilled therapeutic intervention in order to improve the following deficits and impairments:  Abnormal gait,Improper body mechanics,Pain,Decreased coordination,Decreased mobility,Decreased activity tolerance,Decreased endurance,Decreased range of motion,Decreased strength,Hypomobility,Impaired perceived functional ability,Impaired flexibility,Increased edema,Difficulty walking,Decreased balance  Visit Diagnosis: Right ankle pain, unspecified chronicity  Muscle weakness (generalized)  Stiffness of right ankle, not elsewhere classified     Problem List Patient Active Problem List   Diagnosis Date Noted  . Right ear impacted cerumen 04/17/2020  . Right foot pain 01/12/2020  . Hyperlipidemia 05/05/2018  . Menorrhagia with regular cycle 12/25/2017  . Suprapubic pain 12/15/2015  . GERD (gastroesophageal reflux disease) 11/01/2015  . Diarrhea 05/19/2015  . Routine physical examination 02/20/2015  . Diabetes mellitus type 2, controlled, without complications ([place]) 77/93/9030  . Benign essential HTN 02/20/2015  . Herpes simplex 02/20/2015    Everlean Alstrom. Graylon Good, PT, DPT 09/13/20, 7:37 PM  [facility] [address], [place], [address] Phone: [phone]   Fax:  [phone]  Name: Cassandra [name] MRN: [id number] Date of Birth: Aug 25, 1965

## 2020-09-15 ENCOUNTER — Telehealth: Payer: Self-pay | Admitting: Physical Therapy

## 2020-09-15 ENCOUNTER — Ambulatory Visit: Payer: PRIVATE HEALTH INSURANCE | Attending: Orthopaedic Surgery | Admitting: Physical Therapy

## 2020-09-15 DIAGNOSIS — M25671 Stiffness of right ankle, not elsewhere classified: Secondary | ICD-10-CM | POA: Insufficient documentation

## 2020-09-15 DIAGNOSIS — M25571 Pain in right ankle and joints of right foot: Secondary | ICD-10-CM | POA: Insufficient documentation

## 2020-09-15 DIAGNOSIS — M6281 Muscle weakness (generalized): Secondary | ICD-10-CM | POA: Insufficient documentation

## 2020-09-15 NOTE — Telephone Encounter (Addendum)
Called patient after she did not show up for her 4pm appointment today. Patient answered and said she did not hear anything or get a my messages from [place] since her last PT appointment where this PT told her not to come to today's appointment unless she heard from [place] to come due to not having insurance authorization for more appointments at that time. Office staff, Kendal Hymen, obtained 8 more visits to be used prior to next MD visit of insurance authorization earlier in the week and left VM for patient at both phone numbers in her chart that this PT confirmed last PT session. Patient apparently did not get the messages. Updated patient on authorized visits and confirmed next PT session scheduled Tuesday 09/20/2020 at 4:00pm. Offered opening at 6:15pm today but patient unable to come due to already having picked up her child relative.   Luretha Murphy. Ilsa Iha, PT, DPT 09/15/20, 4:22 PM

## 2020-09-20 ENCOUNTER — Ambulatory Visit: Payer: No Typology Code available for payment source | Attending: Orthopaedic Surgery | Admitting: Physical Therapy

## 2020-09-20 ENCOUNTER — Other Ambulatory Visit: Payer: Self-pay

## 2020-09-20 ENCOUNTER — Encounter: Payer: Self-pay | Admitting: Physical Therapy

## 2020-09-20 DIAGNOSIS — M25671 Stiffness of right ankle, not elsewhere classified: Secondary | ICD-10-CM | POA: Insufficient documentation

## 2020-09-20 DIAGNOSIS — M25571 Pain in right ankle and joints of right foot: Secondary | ICD-10-CM | POA: Insufficient documentation

## 2020-09-20 DIAGNOSIS — M6281 Muscle weakness (generalized): Secondary | ICD-10-CM | POA: Insufficient documentation

## 2020-09-20 NOTE — Therapy (Signed)
[facility] [address] [place], [address] Phone: [phone]   Fax:  [phone]  Physical Therapy Treatment / Progress Note Dates of reporting: 08/01/2020 - 09/20/2020  Patient Details  Name: Cassandra [name] MRN: [id number] Date of Birth: 1965/07/03 Referring Provider (PT): Radene Journey, MD   Encounter Date: 09/20/2020   PT End of Session - 09/20/20 1629    Visit Number 10    Number of Visits 24    Date for PT Re-Evaluation 10/24/20    Authorization Type [employer] (Work Comp) reporting period from 08/01/2020    Authorization Time Period Worker's Comp Information systems manager by Sydnee Levans, RN. Evaluation plus 8 visits. [place] [id number]. Sydnee Levans, RN auth 8 additional visits on 6/1    Authorization - Visit Number 10    Authorization - Number of Visits 17    Progress Note Due on Visit 10    PT Start Time 7416    PT Stop Time 3845    PT Time Calculation (min) 40 min    Activity Tolerance Patient tolerated treatment well;Patient limited by pain    Behavior During Therapy [facility] for tasks assessed/performed           Past Medical History:  Diagnosis Date  . Chicken pox   . Diabetes mellitus without complication ([place])   . Herpes   . Hypertension     Past Surgical History:  Procedure Laterality Date  . CHOLECYSTECTOMY  2015  . HERNIA REPAIR     55 years old  . TUBAL LIGATION  1991    There were no vitals filed for this visit.   Subjective Assessment - 09/20/20 1605    Subjective Patient states she woke up this morning and her foot was "perfect" without burning or swelling. Then her pain/swelling returned in about an hour into work. States she was off of it most of the weekend. It seemed a bit stronger this morning too. Her coworkers noticed she seemed to have more pep in her step when she started work today. Is going to the nerve conduction test tomorrow morning. Currently her R ankle is burning and  pain is rated 4/10 over the right ATFL region. HEP is going pretty good.    Pertinent History Patient is a 55 y.o. female who presents to outpatient physical therapy with a referral for medical diagnosis right ankle pain. This patient's chief complaints consist of work related injury 05/02/2020 leading to R ankle pain and swelling leading to the following functional deficits: difficulty with usual activities such as working (currenlty on light duty due to injury), prolonged weight bearing (walking/standing), stairs, walking, housework, sleeping, community activities that require standing such as shopping, church activities. Relevant past medical history and comorbidities include diabetes, high blood pressure, GERD, cholecystectomy, hernia repair (no longer bothers her). Patient denies hx of cancer, stroke, seizures, lung problem, major cardiac events, unexplained weight loss, changes in bowel or bladder problems, new onset stumbling or dropping things.    Diagnostic tests MRI report of R ankle 06/26/2020: "IMPRESSION:  1. Bone marrow edema in the posterior lateral tibial plafond  adjacent talus with adjacent soft tissue edema which may reflect  osseous contusion secondary to direct trauma versus secondary to  posterior ankle impingement syndrome.  2. Anterior tibiofibular ligament is thickened and irregular  consistent with prior injury."    Patient Stated Goals get better    Currently in Pain? Yes    Pain Score 4  Pain Onset More than a month ago             OBJECTIVE  FOTO = 36 (09/13/2020)  (see last note on 09/13/2020 for detailed measurements)   TREATMENT: - not sensitive to latex  Therapeutic exercise:to centralize symptoms and improve ROM, strength, muscular endurance, and activity tolerance required for successful completion of functional activities. - testing to assess progress (see above) - seated R ankle eversion slides with towel on slick surface, 3xlength of towel (increased  burning).  - SLS with circles with other foot on furnature slider, UE support as needed, 3x10 each side.  - standing ankle pump with heel resting on edge of a2z pad attempting to touch ball of foot to floor and lift up again. (balancing on right foot on a2z pad while left foot works), UE support while R foot moves. 3x10.  - seated B ankle circle 1x10 each direction with knees extended and ball held between knees to help isolate movement at ankles (patient reports this is the most uncomfortable exercise this session due to increased burning).   Pt required multimodal cuing for proper technique and to facilitate improved neuromuscular control, strength, range of motion, and functional ability resulting in improved performance and form.  HOME EXERCISE PROGRAM Access Code: [email or web address] URL: [email or web address] Date: 08/23/2020 Prepared by: Rosita Kea  Exercises Seated Figure 4 Ankle Inversion with Resistance - 1 x daily - 1 sets - 20 reps - 2 seconds hold Seated Ankle Eversion with Resistance - 1 x daily - 1 sets - 20 reps - 2 seconds hold Single Leg Stance - 1 x daily - 2 reps - 30 seconds hold  [email or web address] CP [id number] Home Exercise Program [NBP3BEK]  SEATED HEEL SLIDES WITH TOWEL -  Repeat 20 Times, Complete 2 Sets, Perform 3 Times a Week  TOES RAISES - DORSIFLEXION - UNILATERAL -  Repeat 20 Times, Complete 2 Sets, Perform 3 Times a Week  Slider Disc Lunge - Lateral -  Repeat 20 Times, Complete 2 Sets, Perform 3 Times a Week  TOWEL SLIDES - EVERSION -  Repeat 3 Times, Hold 1 Second(s), Complete 1 Set, Perform 1 Times a Day    PT Education - 09/20/20 1629    Education Details Exercise purpose/form. Self management techniques    Person(s) Educated Patient    Methods Explanation;Demonstration;Tactile cues;Verbal cues    Comprehension Verbalized understanding;Returned demonstration;Verbal cues required;Tactile cues required;Need further instruction             PT Short Term Goals - 09/05/20 1837      PT SHORT TERM GOAL #1   Title Be independent with initial home exercise program for self-management of symptoms.    Baseline Initial HEP provided at IE (08/01/2020);    Time 2    Period Weeks    Status Achieved    Target Date 08/15/20             PT Long Term Goals - 09/13/20 1926      PT LONG TERM GOAL #1   Title Be independent with a long-term home exercise program for self-management of symptoms.    Baseline initial HEP provided at IE (08/01/2020); Currently participating as tolerated in appropriate HEP (09/13/2020);    Time 12    Period Weeks    Status Partially Met   TARGET DATE FOR ALL LONG TERM GOALS: 10/24/2020     PT LONG TERM GOAL #2   Title Demonstrate improved FOTO score equal or greater than 66 by visit #  14 to demonstrate improvement in overall condition and self-reported functional ability.    Baseline 60 (08/01/2020); 57 (08/25/2020); 36 (09/13/2020);    Time 12    Period Weeks    Status New      PT LONG TERM GOAL #3   Title Patient will demonstrate R ankle A/PROM equal or greater than L ankle A/PROM with no increase in pain to improve abliity to complete work activities without pain or restrictions.    Baseline R ankle lacking and painful - see objective exam (08/01/2020); continues to be limited and painful (09/13/2020);    Time 12    Period Weeks    Status On-going      PT LONG TERM GOAL #4   Title Improve R ankle strength equal or greater to L ankle strength without increased pain to improve abliity to complete work activities without pain or restrictions.    Baseline painful and weak - see objective exam (08/01/2020); minimal change (09/13/2020);    Time 12    Period Weeks    Status On-going      PT LONG TERM GOAL #5   Title Complete community, work and/or recreational activities without limitation due to current condition    Baseline Functional Limitations: difficulty with usual activities such as working (currenlty on  light duty due to injury), prolonged weight bearing (walking/standing), stairs, walking, housework, sleeping, community activities that require standing such as shopping, church activities (08/01/2020); Patient reports limitations are the same (09/13/2020);    Time 12    Period Weeks    Status On-going                 Plan - 09/20/20 1746    Clinical Impression Statement Patient has completed 10 physical therapy sessions this episode of care and has not made expected progress towards goals except she continues to demonstrate good participation in short term and long term HEP. Patient continues to have burning pain over the ATFL region, limited ROM especially in ankle PF, inversion, and eversion, and significant strength loss in eversion. Per patient she has been set up for nerve conduction testing which PT is in agreement with. Patient has not improved as expected and would benefit from further medical assessment to determine if other routes of care are needed. Patient has demonstrated limited ability to respond to PT thus far but would benefit from continued PT as further investigation is completed in order to decrease deconditioning and further loss of function and to maximize return to prior level of function.    Personal Factors and Comorbidities Age;Comorbidity 3+    Comorbidities diabetes, high blood pressure, GERD, cholecystectomy    Examination-Activity Limitations Stairs;Stand;Locomotion Level;Caring for [employer]    Examination-Participation Restrictions Interpersonal Relationship;Occupation;Yard Work;Community Activity;Volunteer;Shop;Cleaning    Stability/Clinical Decision Making Stable/Uncomplicated    Rehab Potential Good    PT Frequency 2x / week    PT Duration 12 weeks    PT Treatment/Interventions ADLs/Self Care Home Management;Aquatic Therapy;Cryotherapy;Moist Heat;Electrical Stimulation;Gait training;Stair training;Functional mobility training;Therapeutic  activities;Therapeutic exercise;Balance training;Neuromuscular re-education;Manual techniques;Dry needling;Passive range of motion;Spinal Manipulations;Joint Manipulations;Patient/family education    PT Next Visit Plan plan to continue tolerated ankle strengthening, ROM, proprioception    PT Home Exercise Plan [employer]  Access Code: [email or web address]    Consulted and Agree with Plan of Care Patient           Patient will benefit from skilled therapeutic intervention in order to improve the following deficits and impairments:  Abnormal gait,Improper body mechanics,Pain,Decreased coordination,Decreased mobility,Decreased activity tolerance,Decreased  endurance,Decreased range of motion,Decreased strength,Hypomobility,Impaired perceived functional ability,Impaired flexibility,Increased edema,Difficulty walking,Decreased balance  Visit Diagnosis: Right ankle pain, unspecified chronicity  Muscle weakness (generalized)  Stiffness of right ankle, not elsewhere classified     Problem List Patient Active Problem List   Diagnosis Date Noted  . Right ear impacted cerumen 04/17/2020  . Right foot pain 01/12/2020  . Hyperlipidemia 05/05/2018  . Menorrhagia with regular cycle 12/25/2017  . Suprapubic pain 12/15/2015  . GERD (gastroesophageal reflux disease) 11/01/2015  . Diarrhea 05/19/2015  . Routine physical examination 02/20/2015  . Diabetes mellitus type 2, controlled, without complications ([place]) 24/15/9017  . Benign essential HTN 02/20/2015  . Herpes simplex 02/20/2015    Everlean Alstrom. Graylon Good, PT, DPT 09/20/20, 5:47 PM  [place] [address], [place], [address] Phone: [phone]   Fax:  [phone]  Name: Cassandra [name] MRN: [id number] Date of Birth: 17-Mar-1966

## 2020-09-22 ENCOUNTER — Telehealth: Payer: Self-pay | Admitting: Family

## 2020-09-22 ENCOUNTER — Ambulatory Visit: Payer: PRIVATE HEALTH INSURANCE | Admitting: Physical Therapy

## 2020-09-22 NOTE — Telephone Encounter (Signed)
Patient slipped on ice in January. She is seeing a doctor for injury through workmens comp. She was told that she has in her rt foot  neuropathy. Patient says rt foot is still bruise and painful. Patietn would like a second opinion. She is requesting to speak with Claris Che.

## 2020-09-22 NOTE — Telephone Encounter (Signed)
LMTCB

## 2020-09-26 ENCOUNTER — Encounter: Payer: Self-pay | Admitting: Physical Therapy

## 2020-09-26 ENCOUNTER — Ambulatory Visit
Admission: RE | Admit: 2020-09-26 | Discharge: 2020-09-26 | Disposition: A | Discharge status: Home / Self Care | Payer: No Typology Code available for payment source | Source: Ambulatory Visit | Attending: Family | Admitting: Family

## 2020-09-26 ENCOUNTER — Other Ambulatory Visit: Payer: Self-pay

## 2020-09-26 ENCOUNTER — Ambulatory Visit: Payer: PRIVATE HEALTH INSURANCE | Admitting: Physical Therapy

## 2020-09-26 DIAGNOSIS — N6310 Unspecified lump in the right breast, unspecified quadrant: Secondary | ICD-10-CM

## 2020-09-26 DIAGNOSIS — R928 Other abnormal and inconclusive findings on diagnostic imaging of breast: Secondary | ICD-10-CM | POA: Diagnosis not present

## 2020-09-26 DIAGNOSIS — M6281 Muscle weakness (generalized): Secondary | ICD-10-CM

## 2020-09-26 DIAGNOSIS — M25671 Stiffness of right ankle, not elsewhere classified: Secondary | ICD-10-CM | POA: Diagnosis present

## 2020-09-26 DIAGNOSIS — M25571 Pain in right ankle and joints of right foot: Secondary | ICD-10-CM | POA: Diagnosis not present

## 2020-09-26 NOTE — Therapy (Signed)
[place] [address] [place], [address] Phone: [phone]   Fax:  [phone]  Physical Therapy Treatment  Patient Details  Name: Cassandra [name] MRN: [id number] Date of Birth: Oct 25, 1965 Referring Provider (PT): Radene Journey, MD   Encounter Date: 09/26/2020   PT End of Session - 09/26/20 1749     Visit Number 11    Number of Visits 24    Date for PT Re-Evaluation 10/24/20    Authorization Type [employer] (Work Comp) reporting period from 08/01/2020    Authorization Time Period Worker's Comp Information systems manager by Sydnee Levans, RN. Evaluation plus 8 visits. [place] [id number]. Sydnee Levans, RN auth 8 additional visits on 6/1    Authorization - Visit Number 11    Authorization - Number of Visits 17    Progress Note Due on Visit 10    PT Start Time 6063    PT Stop Time 1815    PT Time Calculation (min) 40 min    Activity Tolerance Patient tolerated treatment well    Behavior During Therapy WFL for tasks assessed/performed             Past Medical History:  Diagnosis Date   Chicken pox    Diabetes mellitus without complication ([place])    Herpes    Hypertension     Past Surgical History:  Procedure Laterality Date   CHOLECYSTECTOMY  2015   HERNIA REPAIR     55 years old   [place]    There were no vitals filed for this visit.   Subjective Assessment - 09/26/20 1735     Subjective Patient reports she had the nerve conduction test and it was painful. States she had to stop the test because it was painful but he still was able to get the results. She was told the test shows she has neuropathy from diabetes. The left foot did not hurt but the right foot did. States her R foot is better today and she can move it further into plantarflexion. She worked today and her R ankle is swollen by this evening but she states it looked the same as the left ankle when she woke this morning. It is a  little "crook" in the lateral ankle. States that ever since the day after the nerve conduction test it has felt better. Reports right foot is not burning or hurting but also does not feel as good as the left ankle.    Pertinent History Patient is a 55 y.o. female who presents to outpatient physical therapy with a referral for medical diagnosis right ankle pain. This patient's chief complaints consist of work related injury 05/02/2020 leading to R ankle pain and swelling leading to the following functional deficits: difficulty with usual activities such as working (currenlty on light duty due to injury), prolonged weight bearing (walking/standing), stairs, walking, housework, sleeping, community activities that require standing such as shopping, church activities. Relevant past medical history and comorbidities include diabetes, high blood pressure, GERD, cholecystectomy, hernia repair (no longer bothers her). Patient denies hx of cancer, stroke, seizures, lung problem, major cardiac events, unexplained weight loss, changes in bowel or bladder problems, new onset stumbling or dropping things.    Diagnostic tests MRI report of R ankle 06/26/2020: "IMPRESSION:  1. Bone marrow edema in the posterior lateral tibial plafond  adjacent talus with adjacent soft tissue edema which may reflect  osseous contusion secondary to direct trauma versus secondary to  posterior ankle impingement syndrome.  2. Anterior tibiofibular ligament is thickened and irregular  consistent with prior injury."    Patient Stated Goals get better    Currently in Pain? Yes    Pain Score 1     Pain Onset More than a month ago              OBJECTIVE   FOTO = 36 (09/13/2020)   TREATMENT:  not sensitive to latex   Therapeutic exercise: to centralize symptoms and improve ROM, strength, muscular endurance, and activity tolerance required for successful completion of functional activities.  - seated R ankle eversion and inversion slides  with towel on slick surface, 5 x length of towel (significantly better motion, speed, etc).  - SLS with circles with other foot on furnature slider, no UE support, 3x10 each side.  - standing ankle pump with heel resting on edge of airex pad attempting to touch ball of foot to floor and lift up again. no UE support. 3x10. - seated B ankle circle 1x10 each direction with knees extended and ball held between knees to help isolate movement at ankles (patient reports this feels okay).  - seated R ankle eversion against yellow theraband, 3x10 - seated R ankle inversion in figure 4 position against yellow theraband, 3x10 - Education on HEP including handout (inversion/eversion exercises below reprinted).     Pt required multimodal cuing for proper technique and to facilitate improved neuromuscular control, strength, range of motion, and functional ability resulting in improved performance and form.   HOME EXERCISE PROGRAM Access Code: [email or web address] URL: [email or web address] Date: 08/23/2020 Prepared by: Rosita Kea   Exercises Seated Figure 4 Ankle Inversion with Resistance - 1 x daily - 1 sets - 20 reps - 2 seconds hold Seated Ankle Eversion with Resistance - 1 x daily - 1 sets - 20 reps - 2 seconds hold Single Leg Stance - 1 x daily - 2 reps - 30 seconds hold   [email or web address] CP [id number] Home Exercise Program [NBP3BEK]   SEATED HEEL SLIDES WITH TOWEL -  Repeat 20 Times, Complete 2 Sets, Perform 3 Times a Week   TOES RAISES - DORSIFLEXION - UNILATERAL -  Repeat 20 Times, Complete 2 Sets, Perform 3 Times a Week   Slider Disc Lunge - Lateral -  Repeat 20 Times, Complete 2 Sets, Perform 3 Times a Week   TOWEL SLIDES - EVERSION -  Repeat 3 Times, Hold 1 Second(s), Complete 1 Set, Perform 1 Times a Day    PT Education - 09/26/20 1747     Education Details Exercise purpose/form. Self management techniques    Person(s) Educated Patient    Methods Explanation;Demonstration;Tactile  cues;Verbal cues    Comprehension Verbalized understanding;Returned demonstration;Verbal cues required;Tactile cues required;Need further instruction              PT Short Term Goals - 09/05/20 1837       PT SHORT TERM GOAL #1   Title Be independent with initial home exercise program for self-management of symptoms.    Baseline Initial HEP provided at IE (08/01/2020);    Time 2    Period Weeks    Status Achieved    Target Date 08/15/20               PT Long Term Goals - 09/13/20 1926       PT LONG TERM GOAL #1   Title Be independent with a long-term home exercise program for self-management of symptoms.    Baseline  initial HEP provided at IE (08/01/2020); Currently participating as tolerated in appropriate HEP (09/13/2020);    Time 12    Period Weeks    Status Partially Met   TARGET DATE FOR ALL LONG TERM GOALS: 10/24/2020     PT LONG TERM GOAL #2   Title Demonstrate improved FOTO score equal or greater than 66 by visit # 14 to demonstrate improvement in overall condition and self-reported functional ability.    Baseline 60 (08/01/2020); 57 (08/25/2020); 36 (09/13/2020);    Time 12    Period Weeks    Status New      PT LONG TERM GOAL #3   Title Patient will demonstrate R ankle A/PROM equal or greater than L ankle A/PROM with no increase in pain to improve abliity to complete work activities without pain or restrictions.    Baseline R ankle lacking and painful - see objective exam (08/01/2020); continues to be limited and painful (09/13/2020);    Time 12    Period Weeks    Status On-going      PT LONG TERM GOAL #4   Title Improve R ankle strength equal or greater to L ankle strength without increased pain to improve abliity to complete work activities without pain or restrictions.    Baseline painful and weak - see objective exam (08/01/2020); minimal change (09/13/2020);    Time 12    Period Weeks    Status On-going      PT LONG TERM GOAL #5   Title Complete community,  work and/or recreational activities without limitation due to current condition    Baseline Functional Limitations: difficulty with usual activities such as working (currenlty on light duty due to injury), prolonged weight bearing (walking/standing), stairs, walking, housework, sleeping, community activities that require standing such as shopping, church activities (08/01/2020); Patient reports limitations are the same (09/13/2020);    Time 12    Period Weeks    Status On-going                   Plan - 09/26/20 1917     Clinical Impression Statement Patient arrives today with significant improvement in condition after nerve conduction test that was performed the day after last PT session. Was able to tolerate more exercise, perform at quicker speed and improved ROM. Did report mild increase in irritation by end of session but demonstrated significantly improved eversion strength in the R ankle. Improvement is encouraging for prognosis. Patient would benefit from continued management of limiting condition by skilled physical therapist to address remaining impairments and functional limitations to work towards stated goals and return to PLOF or maximal functional independence.    Personal Factors and Comorbidities Age;Comorbidity 3+    Comorbidities diabetes, high blood pressure, GERD, cholecystectomy    Examination-Activity Limitations Stairs;Stand;Locomotion Level;Caring for [employer]    Examination-Participation Restrictions Interpersonal Relationship;Occupation;Yard Work;Community Activity;Volunteer;Shop;Cleaning    Stability/Clinical Decision Making Stable/Uncomplicated    Rehab Potential Good    PT Frequency 2x / week    PT Duration 12 weeks    PT Treatment/Interventions ADLs/Self Care Home Management;Aquatic Therapy;Cryotherapy;Moist Heat;Electrical Stimulation;Gait training;Stair training;Functional mobility training;Therapeutic activities;Therapeutic exercise;Balance  training;Neuromuscular re-education;Manual techniques;Dry needling;Passive range of motion;Spinal Manipulations;Joint Manipulations;Patient/family education    PT Next Visit Plan plan to continue tolerated ankle strengthening, ROM, proprioception    PT Home Exercise Plan [employer]  Access Code: [email or web address]    Consulted and Agree with Plan of Care Patient             Patient will  benefit from skilled therapeutic intervention in order to improve the following deficits and impairments:  Abnormal gait, Improper body mechanics, Pain, Decreased coordination, Decreased mobility, Decreased activity tolerance, Decreased endurance, Decreased range of motion, Decreased strength, Hypomobility, Impaired perceived functional ability, Impaired flexibility, Increased edema, Difficulty walking, Decreased balance  Visit Diagnosis: Right ankle pain, unspecified chronicity  Muscle weakness (generalized)  Stiffness of right ankle, not elsewhere classified     Problem List Patient Active Problem List   Diagnosis Date Noted   Right ear impacted cerumen 04/17/2020   Right foot pain 01/12/2020   Hyperlipidemia 05/05/2018   Menorrhagia with regular cycle 12/25/2017   Suprapubic pain 12/15/2015   GERD (gastroesophageal reflux disease) 11/01/2015   Diarrhea 05/19/2015   Routine physical examination 02/20/2015   Diabetes mellitus type 2, controlled, without complications ([place]) 58/00/6349   Benign essential HTN 02/20/2015   Herpes simplex 02/20/2015    Everlean Alstrom. Graylon Good, PT, DPT 09/26/20, 7:18 PM   [facility] [address] [place], [address] Phone: [phone]   Fax:  [phone]  Name: Cassandra [name] MRN: [id number] Date of Birth: [birth year]

## 2020-09-28 ENCOUNTER — Ambulatory Visit: Payer: PRIVATE HEALTH INSURANCE | Admitting: Physical Therapy

## 2020-09-28 ENCOUNTER — Encounter: Payer: Self-pay | Admitting: Physical Therapy

## 2020-09-28 ENCOUNTER — Other Ambulatory Visit: Payer: Self-pay

## 2020-09-28 DIAGNOSIS — M6281 Muscle weakness (generalized): Secondary | ICD-10-CM

## 2020-09-28 DIAGNOSIS — M25571 Pain in right ankle and joints of right foot: Secondary | ICD-10-CM | POA: Diagnosis not present

## 2020-09-28 DIAGNOSIS — M25671 Stiffness of right ankle, not elsewhere classified: Secondary | ICD-10-CM

## 2020-09-28 NOTE — Therapy (Signed)
[facility] [address] [place], [address] Phone: [phone]   Fax:  [phone]  Physical Therapy Treatment  Patient Details  Name: Cassandra [name] MRN: [id number] Date of Birth: 1966/04/08 Referring Provider (PT): Radene Journey, MD   Encounter Date: 09/28/2020   PT End of Session - 09/28/20 1510     Visit Number 12    Number of Visits 24    Date for PT Re-Evaluation 10/24/20    Authorization Type [employer] (Work Comp) reporting period from 08/01/2020    Authorization Time Period Worker's Comp Information systems manager by Sydnee Levans, RN. Evaluation plus 8 visits. [place] [id number]. Sydnee Levans, RN auth 8 additional visits on 6/1    Authorization - Visit Number 12    Authorization - Number of Visits 17    Progress Note Due on Visit 10    PT Start Time 7867    PT Stop Time 1516    PT Time Calculation (min) 38 min    Activity Tolerance Patient tolerated treatment well    Behavior During Therapy WFL for tasks assessed/performed             Past Medical History:  Diagnosis Date   Chicken pox    Diabetes mellitus without complication ([place])    Herpes    Hypertension     Past Surgical History:  Procedure Laterality Date   CHOLECYSTECTOMY  2015   HERNIA REPAIR     55 years old   [place]    There were no vitals filed for this visit.   Subjective Assessment - 09/28/20 1440     Subjective Patient reports her pain is 2/10 pain just posterior to the R lateral malleolous. She is not feeling good overall and thinks it is because her blood glucose is low. She had a hard candy and declines any food offered from the clinic. States she had increased burning for about 3 hours after last session. Went into work a bit early today.    Pertinent History Patient is a 55 y.o. female who presents to outpatient physical therapy with a referral for medical diagnosis right ankle pain. This patient's chief  complaints consist of work related injury 05/02/2020 leading to R ankle pain and swelling leading to the following functional deficits: difficulty with usual activities such as working (currenlty on light duty due to injury), prolonged weight bearing (walking/standing), stairs, walking, housework, sleeping, community activities that require standing such as shopping, church activities. Relevant past medical history and comorbidities include diabetes, high blood pressure, GERD, cholecystectomy, hernia repair (no longer bothers her). Patient denies hx of cancer, stroke, seizures, lung problem, major cardiac events, unexplained weight loss, changes in bowel or bladder problems, new onset stumbling or dropping things.    Diagnostic tests MRI report of R ankle 06/26/2020: "IMPRESSION:  1. Bone marrow edema in the posterior lateral tibial plafond  adjacent talus with adjacent soft tissue edema which may reflect  osseous contusion secondary to direct trauma versus secondary to  posterior ankle impingement syndrome.  2. Anterior tibiofibular ligament is thickened and irregular  consistent with prior injury."    Patient Stated Goals get better    Currently in Pain? Yes    Pain Score 2     Pain Onset More than a month ago                OBJECTIVE   FOTO = 36 (09/13/2020)   TREATMENT:  not  sensitive to latex   Therapeutic exercise: to centralize symptoms and improve ROM, strength, muscular endurance, and activity tolerance required for successful completion of functional activities.  - seated R ankle eversion and inversion slides with towel on slick surface with 5# DB weighting down end of weight, 5 x length of towel  - SLS on a2z pad with circles with other foot on furnature slider, no UE support, 3x10 each side (circle out) - seated B ankle circle 2x10 each direction with knees extended and ball held between knees/ankles to help isolate movement at ankles (causes burning).  - B heel raise off edge of  a2z pad with B UE support, 3x10.  - standing toe raise with heel resting on edge of airex pad touching ball of foot to floor and lift up again. no UE support. 3x10. - seated R ankle eversion with yellow theraband, 3x10   Pt required multimodal cuing for proper technique and to facilitate improved neuromuscular control, strength, range of motion, and functional ability resulting in improved performance and form.   HOME EXERCISE PROGRAM Access Code: [email or web address] URL: [email or web address] Date: 08/23/2020 Prepared by: Rosita Kea   Exercises Seated Figure 4 Ankle Inversion with Resistance - 1 x daily - 1 sets - 20 reps - 2 seconds hold Seated Ankle Eversion with Resistance - 1 x daily - 1 sets - 20 reps - 2 seconds hold Single Leg Stance - 1 x daily - 2 reps - 30 seconds hold   [email or web address] CP [id number] Home Exercise Program [[email or web address]]   SEATED HEEL SLIDES WITH TOWEL -  Repeat 20 Times, Complete 2 Sets, Perform 3 Times a Week   TOES RAISES - DORSIFLEXION - UNILATERAL -  Repeat 20 Times, Complete 2 Sets, Perform 3 Times a Week   Slider Disc Lunge - Lateral -  Repeat 20 Times, Complete 2 Sets, Perform 3 Times a Week   TOWEL SLIDES - EVERSION -  Repeat 3 Times, Hold 1 Second(s), Complete 1 Set, Perform 1 Times a Day      PT Education - 09/28/20 1454     Education Details Exercise purpose/form. Self management techniques    Person(s) Educated Patient    Methods Explanation;Demonstration;Tactile cues;Verbal cues    Comprehension Verbalized understanding;Returned demonstration;Verbal cues required;Tactile cues required;Need further instruction              PT Short Term Goals - 09/05/20 1837       PT SHORT TERM GOAL #1   Title Be independent with initial home exercise program for self-management of symptoms.    Baseline Initial HEP provided at IE (08/01/2020);    Time 2    Period Weeks    Status Achieved    Target Date 08/15/20               PT Long Term  Goals - 09/13/20 1926       PT LONG TERM GOAL #1   Title Be independent with a long-term home exercise program for self-management of symptoms.    Baseline initial HEP provided at IE (08/01/2020); Currently participating as tolerated in appropriate HEP (09/13/2020);    Time 12    Period Weeks    Status Partially Met   TARGET DATE FOR ALL LONG TERM GOALS: 10/24/2020     PT LONG TERM GOAL #2   Title Demonstrate improved FOTO score equal or greater than 66 by visit # 14 to demonstrate improvement in overall condition and self-reported functional ability.    Baseline 60 (08/01/2020);  57 (08/25/2020); 36 (09/13/2020);    Time 12    Period Weeks    Status New      PT LONG TERM GOAL #3   Title Patient will demonstrate R ankle A/PROM equal or greater than L ankle A/PROM with no increase in pain to improve abliity to complete work activities without pain or restrictions.    Baseline R ankle lacking and painful - see objective exam (08/01/2020); continues to be limited and painful (09/13/2020);    Time 12    Period Weeks    Status On-going      PT LONG TERM GOAL #4   Title Improve R ankle strength equal or greater to L ankle strength without increased pain to improve abliity to complete work activities without pain or restrictions.    Baseline painful and weak - see objective exam (08/01/2020); minimal change (09/13/2020);    Time 12    Period Weeks    Status On-going      PT LONG TERM GOAL #5   Title Complete community, work and/or recreational activities without limitation due to current condition    Baseline Functional Limitations: difficulty with usual activities such as working (currenlty on light duty due to injury), prolonged weight bearing (walking/standing), stairs, walking, housework, sleeping, community activities that require standing such as shopping, church activities (08/01/2020); Patient reports limitations are the same (09/13/2020);    Time 12    Period Weeks    Status On-going                    Plan - 09/28/20 1456     Clinical Impression Statement Patient tolerated treatment well with some discomfort at the R ankle. Ate another piece of candy during session and hypoglycemia symptoms started to dissipate. Continued R ankle and LE strengthening, proprioception, and ROM as tolerated. Patient continues to be better this week compared to previous weeks. Patient would benefit from continued management of limiting condition by skilled physical therapist to address remaining impairments and functional limitations to work towards stated goals and return to PLOF or maximal functional independence.    Personal Factors and Comorbidities Age;Comorbidity 3+    Comorbidities diabetes, high blood pressure, GERD, cholecystectomy    Examination-Activity Limitations Stairs;Stand;Locomotion Level;Caring for [employer]    Examination-Participation Restrictions Interpersonal Relationship;Occupation;Yard Work;Community Activity;Volunteer;Shop;Cleaning    Stability/Clinical Decision Making Stable/Uncomplicated    Rehab Potential Good    PT Frequency 2x / week    PT Duration 12 weeks    PT Treatment/Interventions ADLs/Self Care Home Management;Aquatic Therapy;Cryotherapy;Moist Heat;Electrical Stimulation;Gait training;Stair training;Functional mobility training;Therapeutic activities;Therapeutic exercise;Balance training;Neuromuscular re-education;Manual techniques;Dry needling;Passive range of motion;Spinal Manipulations;Joint Manipulations;Patient/family education    PT Next Visit Plan plan to continue tolerated ankle strengthening, ROM, proprioception    PT Home Exercise Plan Med[employer]  Access Code: [email or web address]    Consulted and Agree with Plan of Care Patient             Patient will benefit from skilled therapeutic intervention in order to improve the following deficits and impairments:  Abnormal gait, Improper body mechanics, Pain, Decreased coordination, Decreased mobility,  Decreased activity tolerance, Decreased endurance, Decreased range of motion, Decreased strength, Hypomobility, Impaired perceived functional ability, Impaired flexibility, Increased edema, Difficulty walking, Decreased balance  Visit Diagnosis: Right ankle pain, unspecified chronicity  Muscle weakness (generalized)  Stiffness of right ankle, not elsewhere classified     Problem List Patient Active Problem List   Diagnosis Date Noted   Right ear impacted cerumen 04/17/2020   Right  foot pain 01/12/2020   Hyperlipidemia 05/05/2018   Menorrhagia with regular cycle 12/25/2017   Suprapubic pain 12/15/2015   GERD (gastroesophageal reflux disease) 11/01/2015   Diarrhea 05/19/2015   Routine physical examination 02/20/2015   Diabetes mellitus type 2, controlled, without complications ([place]) 33/53/3174   Benign essential HTN 02/20/2015   Herpes simplex 02/20/2015    Everlean Alstrom. Graylon Good, PT, DPT 09/28/20, 3:19 PM    [facility] [address] [place], [address] Phone: [phone]   Fax:  [phone]  Name: Cassandra [name] MRN: [id number] Date of Birth: Jul 30, 1965

## 2020-10-03 ENCOUNTER — Other Ambulatory Visit: Payer: Self-pay

## 2020-10-03 ENCOUNTER — Other Ambulatory Visit: Payer: Self-pay | Admitting: Family

## 2020-10-03 ENCOUNTER — Encounter: Payer: Self-pay | Admitting: Physical Therapy

## 2020-10-03 ENCOUNTER — Ambulatory Visit: Payer: PRIVATE HEALTH INSURANCE | Admitting: Physical Therapy

## 2020-10-03 DIAGNOSIS — M25671 Stiffness of right ankle, not elsewhere classified: Secondary | ICD-10-CM

## 2020-10-03 DIAGNOSIS — M25571 Pain in right ankle and joints of right foot: Secondary | ICD-10-CM | POA: Diagnosis not present

## 2020-10-03 DIAGNOSIS — E119 Type 2 diabetes mellitus without complications: Secondary | ICD-10-CM

## 2020-10-03 DIAGNOSIS — M6281 Muscle weakness (generalized): Secondary | ICD-10-CM

## 2020-10-03 DIAGNOSIS — R197 Diarrhea, unspecified: Secondary | ICD-10-CM

## 2020-10-03 MED ORDER — METFORMIN HCL ER 500 MG PO TB24
ORAL_TABLET | ORAL | 1 refills | Status: DC
  Filled 2020-10-03: qty 360, 90d supply, fill #0
  Filled 2021-03-22: qty 360, 90d supply, fill #1

## 2020-10-03 MED FILL — Empagliflozin Tab 10 MG: ORAL | 90 days supply | Qty: 90 | Fill #0 | Status: AC

## 2020-10-03 MED FILL — Dulaglutide Soln Auto-injector 3 MG/0.5ML: SUBCUTANEOUS | 28 days supply | Qty: 2 | Fill #1 | Status: AC

## 2020-10-03 MED FILL — Atorvastatin Calcium Tab 20 MG (Base Equivalent): ORAL | 90 days supply | Qty: 90 | Fill #0 | Status: AC

## 2020-10-03 NOTE — Therapy (Signed)
[facility] [address], [place], [address] Phone: [phone]   Fax:  [phone]  Physical Therapy Treatment  Patient Details  Name: Cassandra [name] MRN: [id number] Date of Birth: 06-10-1965 Referring Provider (PT): Radene Journey, MD   Encounter Date: 10/03/2020   PT End of Session - 10/03/20 1743     Visit Number 13    Number of Visits 24    Date for PT Re-Evaluation 10/24/20    Authorization Type [employer] (Work Comp) reporting period from 08/01/2020    Authorization Time Period Worker's Comp Information systems manager by Sydnee Levans, RN. Evaluation plus 8 visits. [place] [id number]. Sydnee Levans, RN auth 8 additional visits on 6/1    Authorization - Visit Number 12    Authorization - Number of Visits 17    Progress Note Due on Visit 10    PT Start Time 8811    PT Stop Time 1815    PT Time Calculation (min) 40 min    Activity Tolerance Patient tolerated treatment well    Behavior During Therapy WFL for tasks assessed/performed             Past Medical History:  Diagnosis Date   Chicken pox    Diabetes mellitus without complication ([place])    Herpes    Hypertension     Past Surgical History:  Procedure Laterality Date   CHOLECYSTECTOMY  2015   HERNIA REPAIR     55 years old   [place]    There were no vitals filed for this visit.   Subjective Assessment - 10/03/20 1737     Subjective Patient reports 2/10 pain upon arrival. Did work today (not worse after work). Swelling is still present. Burning is not but there is a "little crook" feeling in her right ankle just posterior to the lateral mallelous. Felt okay after last PT session. She has a follow up with her doctor on Wednesday. Has been doing her HEP and it is getting easy.    Pertinent History Patient is a 55 y.o. female who presents to outpatient physical therapy with a referral for medical diagnosis right ankle pain. [name] chief complaints consist of work related injury 05/02/2020 leading to R ankle pain and swelling leading to the following functional deficits: difficulty with usual activities such as working (currenlty on light duty due to injury), prolonged weight bearing (walking/standing), stairs, walking, housework, sleeping, community activities that require standing such as shopping, church activities. Relevant past medical history and comorbidities include diabetes, high blood pressure, GERD, cholecystectomy, hernia repair (no longer bothers her). Patient denies hx of cancer, stroke, seizures, lung problem, major cardiac events, unexplained weight loss, changes in bowel or bladder problems, new onset stumbling or dropping things.    Diagnostic tests MRI report of R ankle 06/26/2020: "IMPRESSION:  1. Bone marrow edema in the posterior lateral tibial plafond  adjacent talus with adjacent soft tissue edema which may reflect  osseous contusion secondary to direct trauma versus secondary to  posterior ankle impingement syndrome.  2. Anterior tibiofibular ligament is thickened and irregular  consistent with prior injury."    Patient Stated Goals get better    Currently in Pain? Yes    Pain Score 2     Pain Onset More than a month ago               OBJECTIVE   FOTO = 65  (10/03/2020)   TREATMENT:  not sensitive to latex   Therapeutic exercise: to centralize symptoms and improve ROM, strength, muscular endurance, and activity tolerance required for successful completion of functional activities.   Circuit:  - seated R ankle eversion with red theraband, 3x10 - seated R ankle inversion with red theraband in figure 4 position, 3x10  - running man exercise with modifications as needed, 2x10 each side with touchdown UE support as needed. (Stopped due to left hamstring cramp).  - seated left sciatic nerve glide to help with cramp 1x10 (left hamstring cramp gone) - seated B ankle circle 2x10 each  direction with knees extended and small soft green ball held between ankles to help isolate movement at ankles (causes burning).  - B heel raise off edge of a2z pad with B UE support, 3x10.    Pt required multimodal cuing for proper technique and to facilitate improved neuromuscular control, strength, range of motion, and functional ability resulting in improved performance and form.   HOME EXERCISE PROGRAM Access Code: [email or web address] URL: [email or web address] Date: 10/03/2020 Prepared by: Rosita Kea  Exercises Seated Figure 4 Ankle Inversion with Resistance - 1 x daily - 1 sets - 20 reps - 2 seconds hold Seated Ankle Eversion with Resistance - 1 x daily - 1 sets - 20 reps - 2 seconds hold Single Leg Running Balance - 1 x daily - 2-3 sets - 10 reps  [email or web address] CP [id number] Home Exercise Program [[email or web address]]   SEATED HEEL SLIDES WITH TOWEL -  Repeat 20 Times, Complete 2 Sets, Perform 3 Times a Week   TOES RAISES - DORSIFLEXION - UNILATERAL -  Repeat 20 Times, Complete 2 Sets, Perform 3 Times a Week   Slider Disc Lunge - Lateral -  Repeat 20 Times, Complete 2 Sets, Perform 3 Times a Week   TOWEL SLIDES - EVERSION -  Repeat 3 Times, Hold 1 Second(s), Complete 1 Set, Perform 1 Times a Day    PT Education - 10/03/20 1742     Education Details Exercise purpose/form. Self management techniques    Person(s) Educated Patient    Methods Explanation;Demonstration;Tactile cues;Verbal cues    Comprehension Verbalized understanding;Returned demonstration;Verbal cues required;Tactile cues required;Need further instruction              PT Short Term Goals - 09/05/20 1837       PT SHORT TERM GOAL #1   Title Be independent with initial home exercise program for self-management of symptoms.    Baseline Initial HEP provided at IE (08/01/2020);    Time 2    Period Weeks    Status Achieved    Target Date 08/15/20               PT Long Term Goals - 09/13/20 1926       PT  LONG TERM GOAL #1   Title Be independent with a long-term home exercise program for self-management of symptoms.    Baseline initial HEP provided at IE (08/01/2020); Currently participating as tolerated in appropriate HEP (09/13/2020);    Time 12    Period Weeks    Status Partially Met   TARGET DATE FOR ALL LONG TERM GOALS: 10/24/2020     PT LONG TERM GOAL #2   Title Demonstrate improved FOTO score equal or greater than 66 by visit # 14 to demonstrate improvement in overall condition and self-reported functional ability.    Baseline 60 (08/01/2020); 57 (08/25/2020); 36 (09/13/2020);    Time 12    Period Weeks    Status  New      PT LONG TERM GOAL #3   Title Patient will demonstrate R ankle A/PROM equal or greater than L ankle A/PROM with no increase in pain to improve abliity to complete work activities without pain or restrictions.    Baseline R ankle lacking and painful - see objective exam (08/01/2020); continues to be limited and painful (09/13/2020);    Time 12    Period Weeks    Status On-going      PT LONG TERM GOAL #4   Title Improve R ankle strength equal or greater to L ankle strength without increased pain to improve abliity to complete work activities without pain or restrictions.    Baseline painful and weak - see objective exam (08/01/2020); minimal change (09/13/2020);    Time 12    Period Weeks    Status On-going      PT LONG TERM GOAL #5   Title Complete community, work and/or recreational activities without limitation due to current condition    Baseline Functional Limitations: difficulty with usual activities such as working (currenlty on light duty due to injury), prolonged weight bearing (walking/standing), stairs, walking, housework, sleeping, community activities that require standing such as shopping, church activities (08/01/2020); Patient reports limitations are the same (09/13/2020);    Time 12    Period Weeks    Status On-going                   Plan -  10/03/20 1818     Clinical Impression Statement Patient has significantly improved in the last two weeks and continues to progress with exercises with good tolerance. FOTO score improvement to 65 shows improvement in self-reported function. Patient does continue to have ROM, strength, balance, pain, and activity tolerance deficits that negatively affect her work tolerance. Patient would benefit from continued management of limiting condition by skilled physical therapist to address remaining impairments and functional limitations to work towards stated goals and return to PLOF or maximal functional independence.    Personal Factors and Comorbidities Age;Comorbidity 3+    Comorbidities diabetes, high blood pressure, GERD, cholecystectomy    Examination-Activity Limitations Stairs;Stand;Locomotion Level;Caring for [employer]    Examination-Participation Restrictions Interpersonal Relationship;Occupation;Yard Work;Community Activity;Volunteer;Shop;Cleaning    Stability/Clinical Decision Making Stable/Uncomplicated    Rehab Potential Good    PT Frequency 2x / week    PT Duration 12 weeks    PT Treatment/Interventions ADLs/Self Care Home Management;Aquatic Therapy;Cryotherapy;Moist Heat;Electrical Stimulation;Gait training;Stair training;Functional mobility training;Therapeutic activities;Therapeutic exercise;Balance training;Neuromuscular re-education;Manual techniques;Dry needling;Passive range of motion;Spinal Manipulations;Joint Manipulations;Patient/family education    PT Next Visit Plan plan to continue tolerated ankle strengthening, ROM, proprioception    PT Home Exercise Plan Medbridge  Access Code: [email or web address]    Consulted and Agree with Plan of Care Patient             Patient will benefit from skilled therapeutic intervention in order to improve the following deficits and impairments:  Abnormal gait, Improper body mechanics, Pain, Decreased coordination, Decreased mobility, Decreased  activity tolerance, Decreased endurance, Decreased range of motion, Decreased strength, Hypomobility, Impaired perceived functional ability, Impaired flexibility, Increased edema, Difficulty walking, Decreased balance  Visit Diagnosis: Right ankle pain, unspecified chronicity  Muscle weakness (generalized)  Stiffness of right ankle, not elsewhere classified     Problem List Patient Active Problem List   Diagnosis Date Noted   Right ear impacted cerumen 04/17/2020   Right foot pain 01/12/2020   Hyperlipidemia 05/05/2018   Menorrhagia with regular cycle 12/25/2017   Suprapubic  pain 12/15/2015   GERD (gastroesophageal reflux disease) 11/01/2015   Diarrhea 05/19/2015   Routine physical examination 02/20/2015   Diabetes mellitus type 2, controlled, without complications ([place]) 26/83/4196   Benign essential HTN 02/20/2015   Herpes simplex 02/20/2015   Everlean Alstrom. Graylon Good, PT, DPT 10/03/20, 6:19 PM   [facility] [address], [place], [address] Phone: [phone]   Fax:  [phone]  Name: Cassandra [name] MRN: [id number] Date of Birth: 1965/04/27

## 2020-10-04 ENCOUNTER — Other Ambulatory Visit: Payer: Self-pay

## 2020-10-05 ENCOUNTER — Ambulatory Visit: Payer: PRIVATE HEALTH INSURANCE | Admitting: Physical Therapy

## 2020-10-05 ENCOUNTER — Encounter: Payer: Self-pay | Admitting: Physical Therapy

## 2020-10-05 ENCOUNTER — Telehealth: Payer: Self-pay | Admitting: Physical Therapy

## 2020-10-05 DIAGNOSIS — M6281 Muscle weakness (generalized): Secondary | ICD-10-CM

## 2020-10-05 DIAGNOSIS — M25571 Pain in right ankle and joints of right foot: Secondary | ICD-10-CM

## 2020-10-05 DIAGNOSIS — M25671 Stiffness of right ankle, not elsewhere classified: Secondary | ICD-10-CM

## 2020-10-05 NOTE — Therapy (Signed)
[facility] [address], [place], [address] Phone: [phone]   Fax:  [phone]  Physical Therapy No Visit Discharge Summary Dates of reporting: 08/01/2020 - 10/05/2020  Patient Details  Name: Cassandra [name] MRN: [id number] Date of Birth: [birth year] Referring Provider (PT): Radene Journey, MD   Encounter Date: 10/05/2020    Past Medical History:  Diagnosis Date   Chicken pox    Diabetes mellitus without complication ([place])    Herpes    Hypertension     Past Surgical History:  Procedure Laterality Date   CHOLECYSTECTOMY  2015   HERNIA REPAIR     55 years old   [place]    There were no vitals filed for this visit.   Subjective Assessment - 10/05/20 1722     Subjective Spoke with adjuster for patient's work comp case, Sydnee Levans, who reported that Dr. Lucia Gaskins released her from care for her ankle and no further treatment is planned.    Pertinent History Patient is a 55 y.o. female who presents to outpatient physical therapy with a referral for medical diagnosis right ankle pain. This patient's chief complaints consist of work related injury 05/02/2020 leading to R ankle pain and swelling leading to the following functional deficits: difficulty with usual activities such as working (currenlty on light duty due to injury), prolonged weight bearing (walking/standing), stairs, walking, housework, sleeping, community activities that require standing such as shopping, church activities. Relevant past medical history and comorbidities include diabetes, high blood pressure, GERD, cholecystectomy, hernia repair (no longer bothers her). Patient denies hx of cancer, stroke, seizures, lung problem, major cardiac events, unexplained weight loss, changes in bowel or bladder problems, new onset stumbling or dropping things.    Diagnostic tests MRI report of R ankle 06/26/2020: "IMPRESSION:  1. Bone marrow edema in  the posterior lateral tibial plafond  adjacent talus with adjacent soft tissue edema which may reflect  osseous contusion secondary to direct trauma versus secondary to  posterior ankle impingement syndrome.  2. Anterior tibiofibular ligament is thickened and irregular  consistent with prior injury."    Patient Stated Goals get better            OBJECTIVE Patient is not present for examination at this time. Please see previous documentation for latest objective data.    FOTO = 65  (10/03/2020)     PT Short Term Goals - 09/05/20 1837       PT SHORT TERM GOAL #1   Title Be independent with initial home exercise program for self-management of symptoms.    Baseline Initial HEP provided at IE (08/01/2020);    Time 2    Period Weeks    Status Achieved    Target Date 08/15/20               PT Long Term Goals - 10/05/20 1723       PT LONG TERM GOAL #1   Title Be independent with a long-term home exercise program for self-management of symptoms.    Baseline initial HEP provided at IE (08/01/2020); Currently participating as tolerated in appropriate HEP (09/13/2020); provided appropriate HEP for current level of recovery on 10/03/2020 (10/05/2020)    Time 12    Period Weeks    Status Achieved   TARGET DATE FOR ALL LONG TERM GOALS: 10/24/2020     PT LONG TERM GOAL #2   Title Demonstrate improved FOTO score equal or greater than 66  by visit # 14 to demonstrate improvement in overall condition and self-reported functional ability.    Baseline 60 (08/01/2020); 57 (08/25/2020); 36 (09/13/2020); 65 at visit #13 (10/03/2020);    Time 12    Period Weeks    Status Partially Met      PT LONG TERM GOAL #3   Title Patient will demonstrate R ankle A/PROM equal or greater than L ankle A/PROM with no increase in pain to improve abliity to complete work activities without pain or restrictions.    Baseline R ankle lacking and painful - see objective exam (08/01/2020); continues to be limited and painful  (09/13/2020); much improved but not quite normal or pain free (10/03/2020);    Time 12    Period Weeks    Status Partially Met      PT LONG TERM GOAL #4   Title Improve R ankle strength equal or greater to L ankle strength without increased pain to improve abliity to complete work activities without pain or restrictions.    Baseline painful and weak - see objective exam (08/01/2020); minimal change (09/13/2020); significantly improved but not back to normal (10/05/2020);    Time 12    Period Weeks    Status Partially Met      PT LONG TERM GOAL #5   Title Complete community, work and/or recreational activities without limitation due to current condition    Baseline Functional Limitations: difficulty with usual activities such as working (currenlty on light duty due to injury), prolonged weight bearing (walking/standing), stairs, walking, housework, sleeping, community activities that require standing such as shopping, church activities (08/01/2020); Patient reports limitations are the same (09/13/2020); much improved but not all the way back to normal (10/05/2020);    Time 12    Period Weeks    Status Partially Met                Plan - 10/05/20 1730     Clinical Impression Statement Patient attended 13 physical therapy appointments with good participation/effort at each session. She struggled to make progress towards goals initially but demonstrated significant improvement in pain, ROM, strength, and activity tolerance on visit 11 after her nerve conduction test and was able to advance exercise difficulty with improvements more consistent than what would be expected for her condition over her last two visits. Patient was provided with updated HEP at last session appropriate for her level of recover. She was not yet back to baseline with continued pain, ROM, strength, balance, and functional activity tolerance deficits that affect her job performance and quality of life. Patient is being  discharged at this point after her referring physician released her from care at her last follow up today.    Personal Factors and Comorbidities Age;Comorbidity 3+    Comorbidities diabetes, high blood pressure, GERD, cholecystectomy    Examination-Activity Limitations Stairs;Stand;Locomotion Level;Caring for [employer]    Examination-Participation Restrictions Interpersonal Relationship;Occupation;Yard Work;Community Activity;Volunteer;Shop;Cleaning    Stability/Clinical Decision Making Stable/Uncomplicated    Rehab Potential Good    PT Frequency 2x / week    PT Duration 12 weeks    PT Treatment/Interventions ADLs/Self Care Home Management;Aquatic Therapy;Cryotherapy;Moist Heat;Electrical Stimulation;Gait training;Stair training;Functional mobility training;Therapeutic activities;Therapeutic exercise;Balance training;Neuromuscular re-education;Manual techniques;Dry needling;Passive range of motion;Spinal Manipulations;Joint Manipulations;Patient/family education    PT Next Visit Plan patient is now discharged from PT per MD.    [place]  Access Code: [email or web address]    Consulted and Agree with Plan of Care Patient  Patient will benefit from skilled therapeutic intervention in order to improve the following deficits and impairments:  Abnormal gait, Improper body mechanics, Pain, Decreased coordination, Decreased mobility, Decreased activity tolerance, Decreased endurance, Decreased range of motion, Decreased strength, Hypomobility, Impaired perceived functional ability, Impaired flexibility, Increased edema, Difficulty walking, Decreased balance  Visit Diagnosis: Right ankle pain, unspecified chronicity  Muscle weakness (generalized)  Stiffness of right ankle, not elsewhere classified     Problem List Patient Active Problem List   Diagnosis Date Noted   Right ear impacted cerumen 04/17/2020   Right foot pain 01/12/2020   Hyperlipidemia 05/05/2018    Menorrhagia with regular cycle 12/25/2017   Suprapubic pain 12/15/2015   GERD (gastroesophageal reflux disease) 11/01/2015   Diarrhea 05/19/2015   Routine physical examination 02/20/2015   Diabetes mellitus type 2, controlled, without complications ([place]) 50/75/7322   Benign essential HTN 02/20/2015   Herpes simplex 02/20/2015    Everlean Alstrom. Graylon Good, PT, DPT 10/05/20, 5:31 PM   [facility] [address] [place], [address] Phone: [phone]   Fax:  [phone]  Name: HERMINIA WARREN MRN: [id number] Date of Birth: 07-19-1965

## 2020-10-05 NOTE — Telephone Encounter (Signed)
Called Work Comp Adjuster Venida Jarvis, RN, BSN, CCM  at [phone] to get an update on visit authorization after patient's follow up appointment with Dr. Susa Simmonds today. Patty answered and explained patient had been released from care by Dr. Susa Simmonds and no further treatments are planned or authorized.   Cassandra [name]. Ilsa Iha, PT, DPT 10/05/20, 5:20 PM

## 2020-10-06 ENCOUNTER — Other Ambulatory Visit: Payer: Self-pay

## 2020-10-11 ENCOUNTER — Other Ambulatory Visit (HOSPITAL_COMMUNITY): Payer: Self-pay

## 2020-10-11 ENCOUNTER — Encounter: Payer: No Typology Code available for payment source | Admitting: Physical Therapy

## 2020-10-12 ENCOUNTER — Encounter: Payer: No Typology Code available for payment source | Admitting: Physical Therapy

## 2020-10-13 ENCOUNTER — Encounter: Payer: No Typology Code available for payment source | Admitting: Physical Therapy

## 2020-11-03 ENCOUNTER — Other Ambulatory Visit: Payer: Self-pay

## 2020-11-03 MED FILL — Dulaglutide Soln Auto-injector 3 MG/0.5ML: SUBCUTANEOUS | 28 days supply | Qty: 2 | Fill #2 | Status: AC

## 2020-11-30 ENCOUNTER — Encounter: Payer: Self-pay | Admitting: Family

## 2020-11-30 ENCOUNTER — Ambulatory Visit (INDEPENDENT_AMBULATORY_CARE_PROVIDER_SITE_OTHER): Payer: No Typology Code available for payment source | Admitting: Family

## 2020-11-30 ENCOUNTER — Other Ambulatory Visit: Payer: Self-pay

## 2020-11-30 VITALS — BP 120/72 | HR 84 | Temp 97.9°F | Ht 67.01 in | Wt 207.4 lb

## 2020-11-30 DIAGNOSIS — E119 Type 2 diabetes mellitus without complications: Secondary | ICD-10-CM

## 2020-11-30 DIAGNOSIS — I10 Essential (primary) hypertension: Secondary | ICD-10-CM

## 2020-11-30 DIAGNOSIS — Z23 Encounter for immunization: Secondary | ICD-10-CM | POA: Diagnosis not present

## 2020-11-30 DIAGNOSIS — E785 Hyperlipidemia, unspecified: Secondary | ICD-10-CM | POA: Diagnosis not present

## 2020-11-30 NOTE — Assessment & Plan Note (Signed)
Anticipate controlled.  Pending lipid panel.  Continue Lipitor 20mg 

## 2020-11-30 NOTE — Patient Instructions (Addendum)
Nice to see you!   You should stop taking Jardiance ( SGLT2 inhibitor) and seek medical attention immediately if you have any symptoms of ketoacidosis, a serious condition in which the body produces high levels of blood acids called ketones.    Symptoms of ketoacidosis include nausea, vomiting, abdominal pain, tiredness, and trouble breathing.    You should also be alert for signs and symptoms of a urinary tract infection, such as a feeling of burning when urinating or the need to urinate often or right away; pain in the lower part of the stomach area or pelvis; fever; or blood in the urine.   Lastly, if you develop vomiting or diarrhea, such as with gastroenteritis, you should also hold Jardiance and call the office immediately.  As any volume depletion on this medication can cause ketoacidosis.  Please hold Jardiance and contact me immediately if you are experiencing any of these symptoms above.  Empagliflozin Oral Tablets What is this medication? EMPAGLIFLOZIN (EM pa gli FLOE zin) helps to treat type 2 diabetes. It helps to control blood sugar. Treatment is combined with diet and exercise. This drug may also reduce the risk of heart attack, stroke, or death if you have type 2 diabetes and risk factors for heart disease. It also treats heart failure. Itmay lower the risk for treatment of heart failure in the hospital. This medicine may be used for other purposes; ask your health care provider orpharmacist if you have questions. COMMON BRAND NAME(S): Jardiance What should I tell my care team before I take this medication? They need to know if you have any of these conditions: dehydration diabetic ketoacidosis diet low in salt eating less due to illness, surgery, dieting, or any other reason having surgery high cholesterol high levels of potassium in the blood history of pancreatitis or pancreas problems history of yeast infection of the penis or vagina if you often drink  alcohol infections in the bladder, kidneys, or urinary tract kidney disease liver disease low blood pressure on hemodialysis problems urinating type 1 diabetes uncircumcised female an unusual or allergic reaction to empagliflozin, other medicines, foods, dyes, or preservatives pregnant or trying to get pregnant breast-feeding How should I use this medication? Take this medicine by mouth with water. Take it as directed on the prescription label at the same time every day. You may take it with or without food. Keeptaking it unless your health care provider tells you to stop. A special MedGuide will be given to you by the pharmacist with eachprescription and refill. Be sure to read this information carefully each time. Talk to your health care provider about the use of this medicine in children.Special care may be needed. Overdosage: If you think you have taken too much of this medicine contact apoison control center or emergency room at once. NOTE: This medicine is only for you. Do not share this medicine with others. What if I miss a dose? If you miss a dose, take it as soon as you can. If it is almost time for yournext dose, take only that dose. Do not take double or extra doses. What may interact with this medication? alcohol diuretics insulin This list may not describe all possible interactions. Give your health care provider a list of all the medicines, herbs, non-prescription drugs, or dietary supplements you use. Also tell them if you smoke, drink alcohol, or use illegaldrugs. Some items may interact with your medicine. What should I watch for while using this medication? Visit your health care provider  for regular checks on your progress. Tell your health care provider if your symptoms do not start to get better or if they getworse. This medicine can cause a serious condition in which there is too much acid in the blood. If you develop nausea, vomiting, stomach pain, unusual tiredness,  or breathing problems, stop taking this medicine and call your doctor right away.If possible, use a ketone dipstick to check for ketones in your urine. Check with your health care provider if you have severe diarrhea, nausea, and vomiting, or if you sweat a lot. The loss of too much body fluid may make itdangerous for you to take this medicine. A test called the HbA1C (A1C) will be monitored. This is a simple blood test. It measures your blood sugar control over the last 2 to 3 months. You willreceive this test every 3 to 6 months. Learn how to check your blood sugar. Learn the symptoms of low and high bloodsugar and how to manage them. Always carry a quick-source of sugar with you in case you have symptoms of low blood sugar. Examples include hard sugar candy or glucose tablets. Make sure others know that you can choke if you eat or drink when you develop serious symptoms of low blood sugar, such as seizures or unconsciousness. Get medicalhelp at once. Tell your health care provider if you have high blood sugar. You might need to change the dose of your medicine. If you are sick or exercising more thanusual, you may need to change the dose of your medicine. What side effects may I notice from receiving this medication? Side effects that you should report to your doctor or health care professionalas soon as possible: allergic reactions (skin rash, itching or hives, swelling of the face, lips, or tongue) breathing problems dizziness feeling faint or lightheaded, falls genital infection (fever; tenderness, redness, or swelling in the genitals or area from the genitals to the back of the rectum) kidney injury (trouble passing urine or change in the amount of urine) low blood sugar (feeling anxious; confusion; dizziness; increased hunger; unusually weak or tired; increased sweating; shakiness; cold, clammy skin; irritable; headache; blurred vision; fast heartbeat; loss of consciousness) muscle  weakness nausea, vomiting, unusual stomach upset or pain new pain or tenderness, change in skin color, sores or ulcers, or infection in legs or feet penile discharge, itching, or pain unusual tiredness unusual vaginal discharge, itching, or odor urinary tract infection (fever; chills; a burning feeling when urinating; urgent need to urinate more often; blood in the urine; back pain) Side effects that usually do not require medical attention (report to yourdoctor or health care professional if they continue or are bothersome): mild increase in urination thirsty This list may not describe all possible side effects. Call your doctor for medical advice about side effects. You may report side effects to FDA at1-800-FDA-1088. Where should I keep my medication? Keep out of the reach of children and pets. Store at room temperature between 20 and 25 degrees C (68 and 77 degrees F).Get rid of any unused medicine after the expiration date. To get rid of medicines that are no longer needed or have expired: Take the medicine to a medicine take-back program. Check with your pharmacy or law enforcement to find a location. If you cannot return the medicine, check the label or package insert to see if the medicine should be thrown out in the garbage or flushed down the toilet. If you are not sure, ask your health care provider. If it  is safe to put it in the trash, take the medicine out of the container. Mix the medicine with cat litter, dirt, coffee grounds, or other unwanted substance. Seal the mixture in a bag or container. Put it in the trash. NOTE: This sheet is a summary. It may not cover all possible information. If you have questions about this medicine, talk to your doctor, pharmacist, orhealth care provider.  2022 Elsevier/Gold Standard (2019-12-04 19:51:17)

## 2020-11-30 NOTE — Assessment & Plan Note (Addendum)
Anticipate controlled.  Pending A1c.  Continue metformin 1000 mg once daily, Jardiance 10 mg, Trulicity 3 mg.  Counseled patient on symptoms of diabetic ketoacidosis, dehydration, UTI and when to stop Jardiance.  Printed this information on her after visit summary as well.

## 2020-11-30 NOTE — Progress Notes (Signed)
Subjective:    Patient ID: Cassandra [name], female    DOB: 03-Jun-1965, 55 y.o.   MRN: [id number]  CC: [name] is a 55 y.o. female who presents today for follow up.   HPI: Feels well today.  No complaints  hypertension-compliant with lisinopril10 mg Diabetes-compliant with Jardiance 10 mg,trulicity 3mg ; she reduced metformin  to 1000mg  qd due to diarrhea, since resolved.  FGB 90, 110.   HLD- Compliant lipitor 20mg .   Mammogram up-to-date  She plans to call [facility] to schedule Pap smear.  She is aware that she is due     HISTORY:  Past Medical History:  Diagnosis Date   Chicken pox    Diabetes mellitus without complication ([facility])    Herpes    Hypertension    Past Surgical History:  Procedure Laterality Date   CHOLECYSTECTOMY  2015   HERNIA REPAIR     55 years old   TUBAL LIGATION  1991   Family History  Problem Relation Age of Onset   Hypertension Mother    Diabetes Mother    Breast cancer Sister 71   Thyroid cancer Neg Hx     Allergies: Glipizide Current Outpatient Medications on File Prior to Visit  Medication Sig Dispense Refill   atorvastatin (LIPITOR) 20 MG tablet TAKE 1 TABLET BY MOUTH DAILY. 90 tablet 3   Dulaglutide (TRULICITY) 3 MG/0.5ML SOPN INJECT 3 MG INTO THE SKIN ONCE A WEEK. 2 mL 3   empagliflozin (JARDIANCE) 10 MG TABS tablet TAKE 1 TABLET BY MOUTH ONCE DAILY IN THE MORNING. 90 tablet 1   gabapentin (NEURONTIN) 300 MG capsule TAKE 1 AT BEDTIME FOR 3 DAYS. THEN TAKE TWICE A DAY FOR 3 DAYS. THEN CAN INCREASE TO THREE TIMES A DAY IF TOLERATED. 60 capsule 0   lisinopril (ZESTRIL) 10 MG tablet TAKE 1 TABLET BY MOUTH DAILY. 90 tablet 3   meloxicam (MOBIC) 15 MG tablet TAKE 1 TABLET BY MOUTH ONCE DAILY WITH A MEAL 6 tablet 0   meloxicam (MOBIC) 7.5 MG tablet TAKE 1 TABLET BY MOUTH DAILY. 30 tablet 0   metFORMIN (GLUCOPHAGE-XR) 500 MG 24 hr tablet TAKE 2 TABLETS BY MOUTH 2 TIMES DAILY WITH A MEAL 360 tablet 1   omeprazole (PRILOSEC) 20  MG capsule TAKE 1 CAPSULE BY MOUTH DAILY. 30 capsule 0   No current facility-administered medications on file prior to visit.    Social History   Tobacco Use   Smoking status: Never   Smokeless tobacco: Never  Vaping Use   Vaping Use: Never used  Substance Use Topics   Alcohol use: No    Alcohol/week: 0.0 standard drinks   Drug use: No    Review of Systems  Constitutional:  Negative for chills and fever.  Respiratory:  Negative for cough.   Cardiovascular:  Negative for chest pain and palpitations.  Gastrointestinal:  Negative for nausea and vomiting.     Objective:    BP 120/72   Pulse 84   Temp 97.9 F (36.6 C)   Ht 5' 7.01" (1.702 m)   Wt 207 lb 6.4 oz (94.1 kg)   LMP 01/15/2019   SpO2 98%   BMI 32.48 kg/m  BP Readings from Last 3 Encounters:  11/30/20 120/72  08/26/20 118/62  05/02/20 (!) 146/71   Wt Readings from Last 3 Encounters:  11/30/20 207 lb 6.4 oz (94.1 kg)  08/26/20 203 lb 12.8 oz (92.4 kg)  05/02/20 204 lb (92.5 kg)    Physical  Exam Vitals reviewed.  Constitutional:      Appearance: She is well-developed.  Eyes:     Conjunctiva/sclera: Conjunctivae normal.  Cardiovascular:     Rate and Rhythm: Normal rate and regular rhythm.     Pulses: Normal pulses.     Heart sounds: Normal heart sounds.  Pulmonary:     Effort: Pulmonary effort is normal.     Breath sounds: Normal breath sounds. No wheezing, rhonchi or rales.  Skin:    General: Skin is warm and dry.  Neurological:     Mental Status: She is alert.  Psychiatric:        Speech: Speech normal.        Behavior: Behavior normal.        Thought Content: Thought content normal.       Assessment & Plan:   Problem List Items Addressed This Visit       Cardiovascular and Mediastinum   Benign essential HTN    Excellent control.  Continue lisinopril 10 mg        Endocrine   Diabetes mellitus type 2, controlled, without complications ([facility]) - Primary    Anticipate controlled.   Pending A1c.  Continue metformin 1000 mg once daily, Jardiance 10 mg, Trulicity 3 mg.  Counseled patient on symptoms of diabetic ketoacidosis, dehydration, UTI and when to stop Jardiance.  Printed this information on her after visit summary as well.      Relevant Orders   Hemoglobin A1c   Comprehensive metabolic panel   Lipid panel   VITAMIN D 25 Hydroxy (Vit-D Deficiency, Fractures)   Microalbumin / creatinine urine ratio     Other   Hyperlipidemia    Anticipate controlled.  Pending lipid panel.  Continue Lipitor 20mg         I am having Honi D. Lhommedieu maintain her gabapentin, lisinopril, empagliflozin, atorvastatin, meloxicam, omeprazole, meloxicam, Trulicity, and metFORMIN.   No orders of the defined types were placed in this encounter.   Return precautions given.   Risks, benefits, and alternatives of the medications and treatment plan prescribed today were discussed, and patient expressed understanding.   Education regarding symptom management and diagnosis given to patient on AVS.  Continue to follow with , FNP for routine health maintenance.   Miyoshi D Nobis and I agreed with plan.   Allegra Grana, FNP

## 2020-11-30 NOTE — Assessment & Plan Note (Signed)
Excellent control.  Continue lisinopril 10 mg

## 2020-11-30 NOTE — Addendum Note (Signed)
Addended by: Sherley Bounds on: 11/30/2020 04:44 PM   Modules accepted: Orders

## 2020-12-01 LAB — COMPREHENSIVE METABOLIC PANEL
ALT: 15 U/L (ref 0–35)
AST: 12 U/L (ref 0–37)
Albumin: 3.9 g/dL (ref 3.5–5.2)
Alkaline Phosphatase: 97 U/L (ref 39–117)
BUN: 13 mg/dL (ref 6–23)
CO2: 26 mEq/L (ref 19–32)
Calcium: 9.3 mg/dL (ref 8.4–10.5)
Chloride: 106 mEq/L (ref 96–112)
Creatinine, Ser: 0.67 mg/dL (ref 0.40–1.20)
GFR: 99.32 mL/min (ref >60.00)
Glucose, Bld: 98 mg/dL (ref 70–99)
Potassium: 4.2 mEq/L (ref 3.5–5.1)
Sodium: 140 mEq/L (ref 135–145)
Total Bilirubin: 0.4 mg/dL (ref 0.2–1.2)
Total Protein: 6.8 g/dL (ref 6.0–8.3)

## 2020-12-01 LAB — LIPID PANEL
Cholesterol: 146 mg/dL (ref 0–200)
HDL: 59 mg/dL (ref >39.00)
LDL Cholesterol: 70 mg/dL (ref 0–99)
NonHDL: 86.66
Total CHOL/HDL Ratio: 2
Triglycerides: 81 mg/dL (ref 0.0–149.0)
VLDL: 16.2 mg/dL (ref 0.0–40.0)

## 2020-12-01 LAB — MICROALBUMIN / CREATININE URINE RATIO
Creatinine,U: 68.9 mg/dL
Microalb Creat Ratio: 1 mg/g (ref 0.0–30.0)
Microalb, Ur: <0.7 mg/dL (ref 0.0–1.9)

## 2020-12-01 LAB — VITAMIN D 25 HYDROXY (VIT D DEFICIENCY, FRACTURES): VITD: 33.33 ng/mL (ref 30.00–100.00)

## 2020-12-01 LAB — HEMOGLOBIN A1C: Hgb A1c MFr Bld: 7.7 % — ABNORMAL HIGH (ref 4.6–6.5)

## 2020-12-02 ENCOUNTER — Other Ambulatory Visit: Payer: Self-pay | Admitting: Family

## 2020-12-02 ENCOUNTER — Other Ambulatory Visit: Payer: Self-pay

## 2020-12-02 DIAGNOSIS — E119 Type 2 diabetes mellitus without complications: Secondary | ICD-10-CM

## 2020-12-02 MED ORDER — EMPAGLIFLOZIN 25 MG PO TABS
25.0000 mg | ORAL_TABLET | Freq: Every day | ORAL | 1 refills | Status: DC
  Filled 2020-12-02: qty 90, 90d supply, fill #0
  Filled 2021-03-21: qty 90, 90d supply, fill #1

## 2020-12-14 MED FILL — Dulaglutide Soln Auto-injector 3 MG/0.5ML: SUBCUTANEOUS | 28 days supply | Qty: 2 | Fill #3 | Status: AC

## 2020-12-15 ENCOUNTER — Other Ambulatory Visit: Payer: Self-pay

## 2021-01-06 ENCOUNTER — Telehealth: Payer: Self-pay | Admitting: Family

## 2021-01-06 NOTE — Telephone Encounter (Signed)
Marijo Sanes, CMA  12/29/2020 11:15 AM EDT Back to Top    Letter sent to patient.   Marijo Sanes, CMA  12/14/2020  3:56 PM EDT     I tried to call patient & it sounded as if someone picked up then hung up the phone. Will try back.    Sherley Bounds, CMA  12/08/2020 12:17 PM EDT     Placed call to pt. Call could not be completed . No vm left   Allegra Grana, FNP  12/02/2020 11:02 AM EDT     CALL-   Normal electrolytes and kidney function.  Normal urine.  Cholesterol acceptable.  A1c is above goal of 6.5% I have increased Jardiance from 10 mg to 25 mg.  If you have 10 mg tablets left,it is probably fine to take 2 of them to total 20 mg.  You may take them together until you run out of medication.  At that point, you may change to Jardiance 25 mg tablet which I prescribed.   Let me know if cannot reach patient.   Patient calling back in. Patient informed and verbalized understanding.  She has picked up the new rx

## 2021-01-12 ENCOUNTER — Other Ambulatory Visit: Payer: Self-pay | Admitting: Family

## 2021-01-12 DIAGNOSIS — E119 Type 2 diabetes mellitus without complications: Secondary | ICD-10-CM

## 2021-01-13 ENCOUNTER — Other Ambulatory Visit: Payer: Self-pay

## 2021-01-13 ENCOUNTER — Other Ambulatory Visit: Payer: Self-pay | Admitting: Family

## 2021-01-13 DIAGNOSIS — E119 Type 2 diabetes mellitus without complications: Secondary | ICD-10-CM

## 2021-01-13 MED FILL — Dulaglutide Soln Auto-injector 3 MG/0.5ML: SUBCUTANEOUS | 28 days supply | Qty: 2 | Fill #0 | Status: AC

## 2021-02-07 MED FILL — Lisinopril Tab 10 MG: ORAL | 90 days supply | Qty: 90 | Fill #1 | Status: AC

## 2021-02-08 ENCOUNTER — Other Ambulatory Visit: Payer: Self-pay

## 2021-02-13 ENCOUNTER — Other Ambulatory Visit: Payer: Self-pay

## 2021-02-13 MED FILL — Dulaglutide Soln Auto-injector 3 MG/0.5ML: SUBCUTANEOUS | 28 days supply | Qty: 2 | Fill #1 | Status: AC

## 2021-02-21 ENCOUNTER — Other Ambulatory Visit: Payer: Self-pay

## 2021-02-21 ENCOUNTER — Ambulatory Visit: Payer: No Typology Code available for payment source | Attending: Internal Medicine

## 2021-02-21 DIAGNOSIS — Z23 Encounter for immunization: Secondary | ICD-10-CM

## 2021-02-21 MED ORDER — PFIZER COVID-19 VAC BIVALENT 30 MCG/0.3ML IM SUSP
INTRAMUSCULAR | 0 refills | Status: DC
  Filled 2021-02-21: qty 0.3, 1d supply, fill #0

## 2021-02-21 MED FILL — Atorvastatin Calcium Tab 20 MG (Base Equivalent): ORAL | 90 days supply | Qty: 90 | Fill #1 | Status: AC

## 2021-02-21 NOTE — Progress Notes (Signed)
   Covid-19 Vaccination Clinic  Name:  Cassandra [name]    MRN: [id number] DOB: 03/03/1966  02/21/2021  Ms. Cassandra [name] was observed post Covid-19 immunization for 15 minutes without incident. She was provided with Vaccine Information Sheet and instruction to access the V-Safe system.   Ms. Cassandra [name] was instructed to call 911 with any severe reactions post vaccine: Difficulty breathing  Swelling of face and throat  A fast heartbeat  A bad rash all over body  Dizziness and weakness   Immunizations Administered     Name Date Dose VIS Date Route   Pfizer Covid-19 Vaccine Bivalent Booster 02/21/2021 10:47 AM 0.3 mL 12/14/2020 Intramuscular   Manufacturer: [employer], [employer]   Lot: [id number]   NDC: [address]-7      Drusilla Kanner, PharmD, MBA Clinical Acute Care Pharmacist

## 2021-03-08 ENCOUNTER — Other Ambulatory Visit: Payer: Self-pay

## 2021-03-08 ENCOUNTER — Ambulatory Visit (INDEPENDENT_AMBULATORY_CARE_PROVIDER_SITE_OTHER): Payer: No Typology Code available for payment source | Admitting: Family

## 2021-03-08 ENCOUNTER — Encounter: Payer: Self-pay | Admitting: Family

## 2021-03-08 VITALS — BP 118/62 | HR 93 | Temp 97.8°F | Ht 67.0 in | Wt 204.8 lb

## 2021-03-08 DIAGNOSIS — K219 Gastro-esophageal reflux disease without esophagitis: Secondary | ICD-10-CM

## 2021-03-08 DIAGNOSIS — Z23 Encounter for immunization: Secondary | ICD-10-CM

## 2021-03-08 DIAGNOSIS — I10 Essential (primary) hypertension: Secondary | ICD-10-CM | POA: Diagnosis not present

## 2021-03-08 DIAGNOSIS — E119 Type 2 diabetes mellitus without complications: Secondary | ICD-10-CM | POA: Diagnosis not present

## 2021-03-08 DIAGNOSIS — Z0000 Encounter for general adult medical examination without abnormal findings: Secondary | ICD-10-CM

## 2021-03-08 DIAGNOSIS — E785 Hyperlipidemia, unspecified: Secondary | ICD-10-CM

## 2021-03-08 LAB — POCT GLYCOSYLATED HEMOGLOBIN (HGB A1C): Hemoglobin A1C: 7.2 % — AB (ref 4.0–5.6)

## 2021-03-08 MED ORDER — OMEPRAZOLE 20 MG PO CPDR
20.0000 mg | DELAYED_RELEASE_CAPSULE | Freq: Every day | ORAL | 3 refills | Status: DC
  Filled 2021-03-08: qty 90, 90d supply, fill #0

## 2021-03-08 NOTE — Assessment & Plan Note (Signed)
Symptoms consistent with acid reflux.  Restart Prilosec 20mg  qd which is been helpful for her in the past.

## 2021-03-08 NOTE — Assessment & Plan Note (Signed)
Chronic, stable.  Continue Lipitor 20 mg 

## 2021-03-08 NOTE — Assessment & Plan Note (Signed)
Chronic, stable.  Continue lisinopril 10mg 

## 2021-03-08 NOTE — Progress Notes (Signed)
Pre visit review using our clinic review tool, if applicable. No additional management support is needed unless otherwise documented below in the visit note. 

## 2021-03-08 NOTE — Progress Notes (Signed)
Subjective:    Patient ID: Cassandra [name], female    DOB: July 05, 1965, 55 y.o.   MRN: [id number]  CC: Cassandra [name] is a 55 y.o. female who presents today for follow up.   HPI: Complains of epigastric burning over the weeks.  No cp, sob, left arm numbness, jaw pain, pain or trouble swallowing Has used chewable tums with relief.  Worse after pork.  She would like a refill of omeprazole 20 mg which is worked well for her in the past.    She had lost weight down to 197lb previously however endorses dietary indiscretion due to sadness over sisters passing. She has a strong faith and other sister has moved in with her.   Hyperlipidemia-compliant with Lipitor 20 mg  DM-compliant with metformin 1000 mg once daily, Jardiance 25 mg, Trulicity 3 mg. Endorses dietary indiscretion with more sweets these past 2 weeks.   HTN-compliant with lisinopril 10 mg  Due PCV20  She had pneumococcal 23 12/25/2017  Unknown pneumonia vaccine 09/21/2013  HISTORY:  Past Medical History:  Diagnosis Date   Chicken pox    Diabetes mellitus without complication (HCC)    Herpes    Hypertension    Past Surgical History:  Procedure Laterality Date   CHOLECYSTECTOMY  2015   HERNIA REPAIR     55 years old   TUBAL LIGATION  1991   Family History  Problem Relation Age of Onset   Hypertension Mother    Diabetes Mother    Breast cancer Sister 10   Thyroid cancer Neg Hx     Allergies: Eggs or egg-derived products and Glipizide Current Outpatient Medications on File Prior to Visit  Medication Sig Dispense Refill   atorvastatin (LIPITOR) 20 MG tablet TAKE 1 TABLET BY MOUTH DAILY. 90 tablet 3   Dulaglutide (TRULICITY) 3 MG/0.5ML SOPN INJECT 3 MG INTO THE SKIN ONCE A WEEK. 2 mL 3   empagliflozin (JARDIANCE) 25 MG TABS tablet Take 1 tablet (25 mg total) by mouth daily before breakfast. 90 tablet 1   gabapentin (NEURONTIN) 300 MG capsule TAKE 1 AT BEDTIME FOR 3 DAYS. THEN TAKE TWICE A DAY FOR 3 DAYS. THEN  CAN INCREASE TO THREE TIMES A DAY IF TOLERATED. 60 capsule 0   lisinopril (ZESTRIL) 10 MG tablet TAKE 1 TABLET BY MOUTH DAILY. 90 tablet 3   metFORMIN (GLUCOPHAGE-XR) 500 MG 24 hr tablet TAKE 2 TABLETS BY MOUTH 2 TIMES DAILY WITH A MEAL 360 tablet 1   No current facility-administered medications on file prior to visit.    Social History   Tobacco Use   Smoking status: Never   Smokeless tobacco: Never  Vaping Use   Vaping Use: Never used  Substance Use Topics   Alcohol use: No    Alcohol/week: 0.0 standard drinks   Drug use: No    Review of Systems  Constitutional:  Negative for chills and fever.  Respiratory:  Negative for cough.   Cardiovascular:  Negative for chest pain and palpitations.  Gastrointestinal:  Negative for abdominal pain, nausea and vomiting.     Objective:    BP 118/62   Pulse 93   Temp 97.8 F (36.6 C) (Skin)   Ht 5\' 7"  (1.702 m)   Wt 204 lb 12.8 oz (92.9 kg)   SpO2 98%   BMI 32.08 kg/m  BP Readings from Last 3 Encounters:  03/08/21 118/62  11/30/20 120/72  08/26/20 118/62   Wt Readings from Last 3 Encounters:  03/08/21 204 lb  12.8 oz (92.9 kg)  11/30/20 207 lb 6.4 oz (94.1 kg)  08/26/20 203 lb 12.8 oz (92.4 kg)    Physical Exam Vitals reviewed.  Constitutional:      Appearance: She is well-developed.  Eyes:     Conjunctiva/sclera: Conjunctivae normal.  Cardiovascular:     Rate and Rhythm: Normal rate and regular rhythm.     Pulses: Normal pulses.     Heart sounds: Normal heart sounds.  Pulmonary:     Effort: Pulmonary effort is normal.     Breath sounds: Normal breath sounds. No wheezing, rhonchi or rales.  Skin:    General: Skin is warm and dry.  Neurological:     Mental Status: She is alert.  Psychiatric:        Speech: Speech normal.        Behavior: Behavior normal.        Thought Content: Thought content normal.       Assessment & Plan:   Problem List Items Addressed This Visit       Cardiovascular and Mediastinum    Benign essential HTN    Chronic, stable.  Continue lisinopril 10 mg        Digestive   GERD (gastroesophageal reflux disease)    Symptoms consistent with acid reflux.  Restart Prilosec 20mg  qd which is been helpful for her in the past.      Relevant Medications   omeprazole (PRILOSEC) 20 MG capsule     Endocrine   Diabetes mellitus type 2, controlled, without complications (HCC) - Primary    Lab Results  Component Value Date   HGBA1C 7.2 (A) 03/08/2021  Improved despite  recent dietary indiscretion after  the loss of her beloved sister.  We jointly agreed that she had had significant weight loss on current regimen and as she focuses on diet again, she would resume this progress.  We agreed not to increase medication at this time.  She will continue metformin 1000 mg once daily, Jardiance 25 mg, Trulicity 3 mg      Relevant Orders   POCT HgB A1C (Completed)   Basic metabolic panel     Other   Hyperlipidemia    Chronic, stable.  Continue Lipitor 20 mg      Routine physical examination   Relevant Orders   Ambulatory referral to Obstetrics / Gynecology   Other Visit Diagnoses     Need for vaccination       Relevant Orders   Pneumococcal conjugate vaccine 20-valent (Prevnar 20) (Completed)        I have discontinued Cassandra [name]'s meloxicam, omeprazole, meloxicam, and Pfizer COVID-19 Vac Bivalent. I am also having her start on omeprazole. Additionally, I am having her maintain her gabapentin, lisinopril, atorvastatin, metFORMIN, empagliflozin, and Trulicity.   Meds ordered this encounter  Medications   omeprazole (PRILOSEC) 20 MG capsule    Sig: Take 1 capsule (20 mg total) by mouth daily.    Dispense:  90 capsule    Refill:  3    Order Specific Question:   Supervising Provider    Answer:   03/10/2021 [[id number]]     Return precautions given.   Risks, benefits, and alternatives of the medications and treatment plan prescribed today were discussed, and  patient expressed understanding.   Education regarding symptom management and diagnosis given to patient on AVS.  Continue to follow with Cassandra Shams, FNP for routine health maintenance.   Cassandra [name] and I agreed with  plan.   Cassandra Paris, FNP

## 2021-03-08 NOTE — Assessment & Plan Note (Signed)
Lab Results  Component Value Date   HGBA1C 7.2 (A) 03/08/2021   Improved despite  recent dietary indiscretion after  the loss of her beloved sister.  We jointly agreed that she had had significant weight loss on current regimen and as she focuses on diet again, she would resume this progress.  We agreed not to increase medication at this time.  She will continue metformin 1000 mg once daily, Jardiance 25 mg, Trulicity 3 mg

## 2021-03-08 NOTE — Patient Instructions (Signed)
Duncan Dull - I may be under her name with [facility].

## 2021-03-09 LAB — BASIC METABOLIC PANEL
BUN: 17 mg/dL (ref 7–25)
CO2: 21 mmol/L (ref 20–32)
Calcium: 9.4 mg/dL (ref 8.6–10.4)
Chloride: 102 mmol/L (ref 98–110)
Creat: 0.7 mg/dL (ref 0.50–1.03)
Glucose, Bld: 76 mg/dL (ref 65–99)
Potassium: 5.1 mmol/L (ref 3.5–5.3)
Sodium: 138 mmol/L (ref 135–146)

## 2021-03-09 LAB — SPECIMEN COMPROMISED

## 2021-03-10 ENCOUNTER — Other Ambulatory Visit: Payer: Self-pay

## 2021-03-13 MED FILL — Dulaglutide Soln Auto-injector 3 MG/0.5ML: SUBCUTANEOUS | 28 days supply | Qty: 2 | Fill #2 | Status: AC

## 2021-03-14 ENCOUNTER — Other Ambulatory Visit: Payer: Self-pay

## 2021-03-15 ENCOUNTER — Telehealth: Payer: Self-pay

## 2021-03-15 NOTE — Telephone Encounter (Signed)
[facility] referring for Routine physical examination. Called and left voicemail for patient to call back to be scheduled.

## 2021-03-16 NOTE — Telephone Encounter (Signed)
Called and left voicemail for patient to call back to be scheduled. 

## 2021-03-16 NOTE — Telephone Encounter (Signed)
Phone on file not accepting calls. 

## 2021-03-22 ENCOUNTER — Other Ambulatory Visit: Payer: Self-pay

## 2021-03-22 NOTE — Telephone Encounter (Signed)
Unable to reach patient contacting referring provider multiple attempts to reach patient were unsuccessful

## 2021-03-22 NOTE — Telephone Encounter (Signed)
Called and left voicemail for patient to call back to be scheduled. 

## 2021-04-14 ENCOUNTER — Other Ambulatory Visit: Payer: Self-pay

## 2021-04-14 MED FILL — Dulaglutide Soln Auto-injector 3 MG/0.5ML: SUBCUTANEOUS | 28 days supply | Qty: 2 | Fill #3 | Status: AC

## 2021-05-01 ENCOUNTER — Other Ambulatory Visit (HOSPITAL_COMMUNITY): Payer: Self-pay

## 2021-05-01 ENCOUNTER — Other Ambulatory Visit: Payer: Self-pay

## 2021-05-17 ENCOUNTER — Other Ambulatory Visit: Payer: Self-pay

## 2021-05-17 ENCOUNTER — Telehealth: Payer: Self-pay | Admitting: Family

## 2021-05-17 NOTE — Telephone Encounter (Signed)
Patient has new insurance and switched her pharmacy to [place] on [address]. Patient is needing new prescriptions sent to [facility] for    atorvastatin (LIPITOR) 20 MG tablet Dulaglutide (TRULICITY) 3 MG/0.5ML SOPN empagliflozin (JARDIANCE) 25 MG TABS tablet lisinopril (ZESTRIL) 10 MG tablet

## 2021-05-22 ENCOUNTER — Other Ambulatory Visit: Payer: Self-pay

## 2021-05-22 DIAGNOSIS — E119 Type 2 diabetes mellitus without complications: Secondary | ICD-10-CM

## 2021-05-22 MED ORDER — TRULICITY 3 MG/0.5ML ~~LOC~~ SOAJ
SUBCUTANEOUS | 3 refills | Status: DC
  Filled 2021-05-23: qty 2, 28d supply, fill #0
  Filled 2021-06-21: qty 2, 28d supply, fill #1
  Filled 2021-07-23: qty 2, 28d supply, fill #2
  Filled 2021-08-21: qty 2, 28d supply, fill #3

## 2021-05-22 MED ORDER — LISINOPRIL 10 MG PO TABS: ORAL_TABLET | Freq: Every day | ORAL | 3 refills | Status: DC

## 2021-05-22 MED ORDER — ATORVASTATIN CALCIUM 20 MG PO TABS
ORAL_TABLET | Freq: Every day | ORAL | 3 refills | Status: DC
  Filled 2022-03-13: qty 90, 90d supply, fill #0

## 2021-05-22 MED ORDER — EMPAGLIFLOZIN 25 MG PO TABS: 25.0000 mg | ORAL_TABLET | Freq: Every day | ORAL | 1 refills | Status: DC

## 2021-05-22 NOTE — Telephone Encounter (Signed)
Refills sent to [facility] [address]

## 2021-05-23 ENCOUNTER — Other Ambulatory Visit: Payer: Self-pay

## 2021-05-25 ENCOUNTER — Other Ambulatory Visit: Payer: Self-pay

## 2021-05-31 ENCOUNTER — Encounter: Payer: Self-pay | Admitting: Family

## 2021-05-31 ENCOUNTER — Ambulatory Visit: Payer: BC Managed Care – PPO | Admitting: Family

## 2021-05-31 ENCOUNTER — Other Ambulatory Visit: Payer: Self-pay

## 2021-05-31 VITALS — BP 102/61 | HR 92 | Temp 98.0°F | Ht 67.0 in | Wt 211.0 lb

## 2021-05-31 DIAGNOSIS — K219 Gastro-esophageal reflux disease without esophagitis: Secondary | ICD-10-CM | POA: Diagnosis not present

## 2021-05-31 DIAGNOSIS — E119 Type 2 diabetes mellitus without complications: Secondary | ICD-10-CM | POA: Diagnosis not present

## 2021-05-31 LAB — POCT GLYCOSYLATED HEMOGLOBIN (HGB A1C): Hemoglobin A1C: 7.3 % — AB (ref 4.0–5.6)

## 2021-05-31 NOTE — Patient Instructions (Signed)
Call [facility] to schedule establish care: : [phone]  Nice to see you!

## 2021-05-31 NOTE — Progress Notes (Signed)
Subjective:    Patient ID: Cassandra [name], female    DOB: Feb 02, 1966, 56 y.o.   MRN: [id number]  CC: Cassandra [name] is a 56 y.o. female who presents today for follow up.   HPI: Feels well today.  No new complaints.  She endorses weight gain from dietary indiscretion  Epigastric pain-she took Prilosec 20 mg for a week and symptoms improved. She has since used prilosec prn with relief.  No cp, trouble swallowing, sob.  DM- she has been compliant with metformin 1000 mg once daily, Jardiance 25 mg, Trulicity 3 mg. Loose stools completely has resolved.  She is tolerating Trulicity well  HISTORY:  Past Medical History:  Diagnosis Date   Chicken pox    Diabetes mellitus without complication (HCC)    Herpes    Hypertension    Past Surgical History:  Procedure Laterality Date   CHOLECYSTECTOMY  2015   HERNIA REPAIR     56 years old   TUBAL LIGATION  1991   Family History  Problem Relation Age of Onset   Hypertension Mother    Diabetes Mother    Breast cancer Sister 37   Thyroid cancer Neg Hx     Allergies: Eggs or egg-derived products and Glipizide Current Outpatient Medications on File Prior to Visit  Medication Sig Dispense Refill   atorvastatin (LIPITOR) 20 MG tablet TAKE 1 TABLET BY MOUTH DAILY. 90 tablet 3   Dulaglutide (TRULICITY) 3 MG/0.5ML SOPN INJECT 3 MG INTO THE SKIN ONCE A WEEK. 2 mL 3   empagliflozin (JARDIANCE) 25 MG TABS tablet Take 1 tablet (25 mg total) by mouth daily before breakfast. 90 tablet 1   lisinopril (ZESTRIL) 10 MG tablet TAKE 1 TABLET BY MOUTH DAILY. 90 tablet 3   metFORMIN (GLUCOPHAGE-XR) 500 MG 24 hr tablet TAKE 2 TABLETS BY MOUTH 2 TIMES DAILY WITH A MEAL 360 tablet 1   omeprazole (PRILOSEC) 20 MG capsule Take 1 capsule (20 mg total) by mouth daily. 90 capsule 3   No current facility-administered medications on file prior to visit.    Social History   Tobacco Use   Smoking status: Never   Smokeless tobacco: Never  Vaping Use    Vaping Use: Never used  Substance Use Topics   Alcohol use: No    Alcohol/week: 0.0 standard drinks   Drug use: No    Review of Systems  Constitutional:  Negative for chills and fever.  Respiratory:  Negative for cough.   Cardiovascular:  Negative for chest pain and palpitations.  Gastrointestinal:  Negative for diarrhea (resolved), nausea and vomiting.     Objective:    BP 102/61 (BP Location: Left Arm, Patient Position: Sitting, Cuff Size: Large)    Pulse 92    Temp 98 F (36.7 C) (Oral)    Ht 5\' 7"  (1.702 m)    Wt 211 lb (95.7 kg)    SpO2 98%    BMI 33.05 kg/m  BP Readings from Last 3 Encounters:  05/31/21 102/61  03/08/21 118/62  11/30/20 120/72   Wt Readings from Last 3 Encounters:  05/31/21 211 lb (95.7 kg)  03/08/21 204 lb 12.8 oz (92.9 kg)  11/30/20 207 lb 6.4 oz (94.1 kg)    Physical Exam Vitals reviewed.  Constitutional:      Appearance: She is well-developed.  Eyes:     Conjunctiva/sclera: Conjunctivae normal.  Cardiovascular:     Rate and Rhythm: Normal rate and regular rhythm.     Pulses: Normal pulses.  Heart sounds: Normal heart sounds.  Pulmonary:     Effort: Pulmonary effort is normal.     Breath sounds: Normal breath sounds. No wheezing, rhonchi or rales.  Skin:    General: Skin is warm and dry.  Neurological:     Mental Status: She is alert.  Psychiatric:        Speech: Speech normal.        Behavior: Behavior normal.        Thought Content: Thought content normal.       Assessment & Plan:   Problem List Items Addressed This Visit       Digestive   GERD (gastroesophageal reflux disease)    Resolved at this time.  Counseled on going forward not using PPIs daily and to use them when symptoms flare and then wean off.  She verbalized understanding of all        Endocrine   Diabetes mellitus type 2, controlled, without complications (HCC) - Primary    Lab Results  Component Value Date   HGBA1C 7.3 (A) 05/31/2021  Uncontrolled.   Discussed low glycemic diet, dietary modifications versus increasing Trulicity.  Patient politely declines increasing Trulicity at this time.  She would like to make dietary changes.  For now, continue metformin 1000 mg once daily, Jardiance 25 mg, Trulicity 3 mg      Relevant Orders   POCT HgB A1C (Completed)   Ambulatory referral to Obstetrics / Gynecology     I have discontinued [employer]'s gabapentin. I am also having her maintain her metFORMIN, omeprazole, Trulicity, empagliflozin, lisinopril, and atorvastatin.   No orders of the defined types were placed in this encounter.   Return precautions given.   Risks, benefits, and alternatives of the medications and treatment plan prescribed today were discussed, and patient expressed understanding.   Education regarding symptom management and diagnosis given to patient on AVS.  Continue to follow with Allegra Grana, FNP for routine health maintenance.   Anessa D Alper and I agreed with plan.   Rennie Plowman, FNP

## 2021-05-31 NOTE — Assessment & Plan Note (Signed)
Lab Results  Component Value Date   HGBA1C 7.3 (A) 05/31/2021   Uncontrolled.  Discussed low glycemic diet, dietary modifications versus increasing Trulicity.  Patient politely declines increasing Trulicity at this time.  She would like to make dietary changes.  For now, continue metformin 1000 mg once daily, Jardiance 25 mg, Trulicity 3 mg

## 2021-05-31 NOTE — Assessment & Plan Note (Signed)
Resolved at this time.  Counseled on going forward not using PPIs daily and to use them when symptoms flare and then wean off.  She verbalized understanding of all

## 2021-06-08 ENCOUNTER — Telehealth: Payer: Self-pay

## 2021-06-08 NOTE — Telephone Encounter (Signed)
[facility] referring for re est care with [facility]. Called and left voicemail for patient to call back to be scheduled.

## 2021-06-08 NOTE — Telephone Encounter (Signed)
Pt is scheduled with [facility] on 3/28.

## 2021-06-22 ENCOUNTER — Other Ambulatory Visit: Payer: Self-pay

## 2021-07-07 ENCOUNTER — Other Ambulatory Visit (HOSPITAL_COMMUNITY): Payer: Self-pay

## 2021-07-11 ENCOUNTER — Ambulatory Visit: Payer: BC Managed Care – PPO | Admitting: Obstetrics and Gynecology

## 2021-07-11 ENCOUNTER — Other Ambulatory Visit: Payer: Self-pay

## 2021-07-11 ENCOUNTER — Encounter: Payer: Self-pay | Admitting: Obstetrics and Gynecology

## 2021-07-11 VITALS — BP 120/70 | Ht 67.0 in | Wt 213.0 lb

## 2021-07-11 DIAGNOSIS — Z01419 Encounter for gynecological examination (general) (routine) without abnormal findings: Secondary | ICD-10-CM | POA: Diagnosis not present

## 2021-07-11 DIAGNOSIS — Z1231 Encounter for screening mammogram for malignant neoplasm of breast: Secondary | ICD-10-CM

## 2021-07-11 NOTE — Patient Instructions (Addendum)
I value your feedback and you entrusting us with your care. If you get a [facility] patient survey, I would appreciate you taking the time to let us know about your experience today. Thank you!  [facility]: [phone]      

## 2021-07-11 NOTE — Progress Notes (Signed)
? ?PCP: Allegra GranaArnett, Margaret G, FNP ? ? ?Chief Complaint  ?Patient presents with  ? Gynecologic Exam  ?  No concerns  ? ? ?HPI: ?     Ms. Cassandra [name] is a 56 y.o. [id number] whose LMP was No LMP recorded. Patient is perimenopausal., presents today for her NP> 3 yrs annual examination.  Her menses are absent since 11/21. No PMB. She has occas vasomotor sx.  ? ?Sex activity: not sexually active. She does not have vaginal dryness. ? ?Last Pap: 06/03/17 Results were: no abnormalities /neg HPV DNA.  ?No hx of abn paps with bx/tx.  ? ?Last mammogram: 09/08/20  Results were: normal--routine follow-up in 12 months; s/p addl views with RT breast cysts ?There is a FH of breast cancer in her sister, genetic testing not indicated for pt. There is no FH of ovarian cancer. The patient does do self-breast exams. ? ?Colonoscopy: age 56  Repeat due after 10 years per pt.  ? ?Tobacco use: The patient denies current or previous tobacco use. ?Alcohol use: none ?No drug use ?Exercise: not active ? ?She does get adequate calcium and Vitamin D in her diet. ? ?Labs with PCP. ? ?Patient Active Problem List  ? Diagnosis Date Noted  ? Right ear impacted cerumen 04/17/2020  ? Right foot pain 01/12/2020  ? Hyperlipidemia 05/05/2018  ? Menorrhagia with regular cycle 12/25/2017  ? Suprapubic pain 12/15/2015  ? GERD (gastroesophageal reflux disease) 11/01/2015  ? Diarrhea 05/19/2015  ? Routine physical examination 02/20/2015  ? Diabetes mellitus type 2, controlled, without complications (HCC) 02/20/2015  ? Benign essential HTN 02/20/2015  ? Herpes simplex 02/20/2015  ? ? ?Past Surgical History:  ?Procedure Laterality Date  ? CHOLECYSTECTOMY  2015  ? HERNIA REPAIR    ? 56 years old  ? TUBAL LIGATION  1991  ? ? ?Family History  ?Problem Relation Age of Onset  ? Hypertension Mother   ? Diabetes Mother   ? Breast cancer Sister [id number]  ? Thyroid cancer Neg Hx   ? ? ?Social History  ? ?Socioeconomic History  ? Marital status: Single  ?  Spouse name: Not on  file  ? Number of children: Not on file  ? Years of education: Not on file  ? Highest education level: Not on file  ?Occupational History  ? Not on file  ?Tobacco Use  ? Smoking status: Never  ? Smokeless tobacco: Never  ?Vaping Use  ? Vaping Use: Never used  ?Substance and Sexual Activity  ? Alcohol use: No  ?  Alcohol/week: 0.0 standard drinks  ? Drug use: No  ? Sexual activity: Not Currently  ?  Birth control/protection: None  ?Other Topics Concern  ? Not on file  ?Social History Narrative  ? 2 children (Sons)   ? Single  ? Employed [employer]   ? GED  ? Caffeine- [employer]- x3 20 oz, no tea, coffee when cold- 1 cup  ? Exercise- wants to start a regimen.   ?   ? Sister passed away from breast cancer 01/2021  ? ?Social Determinants of Health  ? ?Financial Resource Strain: Not on file  ?Food Insecurity: Not on file  ?Transportation Needs: Not on file  ?Physical Activity: Not on file  ?Stress: Not on file  ?Social Connections: Not on file  ?Intimate Partner Violence: Not on file  ? ? ? ?Current Outpatient Medications:  ?  atorvastatin (LIPITOR) 20 MG tablet, TAKE 1 TABLET BY MOUTH DAILY., Disp: 90 tablet, Rfl: 3 ?  Dulaglutide (TRULICITY) 3 MG/0.5ML SOPN, INJECT 3 MG INTO THE SKIN ONCE A WEEK., Disp: 2 mL, Rfl: 3 ?  empagliflozin (JARDIANCE) 25 MG TABS tablet, Take 1 tablet (25 mg total) by mouth daily before breakfast., Disp: 90 tablet, Rfl: 1 ?  lisinopril (ZESTRIL) 10 MG tablet, TAKE 1 TABLET BY MOUTH DAILY., Disp: 90 tablet, Rfl: 3 ?  metFORMIN (GLUCOPHAGE-XR) 500 MG 24 hr tablet, TAKE 2 TABLETS BY MOUTH 2 TIMES DAILY WITH A MEAL, Disp: 360 tablet, Rfl: 1 ?  omeprazole (PRILOSEC) 20 MG capsule, Take 1 capsule (20 mg total) by mouth daily., Disp: 90 capsule, Rfl: 3 ? ? ? ? ?ROS: ? ?Review of Systems  ?Constitutional:  Negative for fatigue, fever and unexpected weight change.  ?Respiratory:  Negative for cough, shortness of breath and wheezing.   ?Cardiovascular:  Negative for chest pain, palpitations and leg  swelling.  ?Gastrointestinal:  Negative for blood in stool, constipation, diarrhea, nausea and vomiting.  ?Endocrine: Negative for cold intolerance, heat intolerance and polyuria.  ?Genitourinary:  Negative for dyspareunia, dysuria, flank pain, frequency, genital sores, hematuria, menstrual problem, pelvic pain, urgency, vaginal bleeding, vaginal discharge and vaginal pain.  ?Musculoskeletal:  Negative for back pain, joint swelling and myalgias.  ?Skin:  Negative for rash.  ?Neurological:  Negative for dizziness, syncope, light-headedness, numbness and headaches.  ?Hematological:  Negative for adenopathy.  ?Psychiatric/Behavioral:  Negative for agitation, confusion, sleep disturbance and suicidal ideas. The patient is not nervous/anxious.   ?BREAST: No symptoms ? ? ? ?Objective: ?BP 120/70   Ht 5\' 7"  (1.702 m)   Wt 213 lb (96.6 kg)   BMI 33.36 kg/m?  ? ? ?Physical Exam ?Constitutional:   ?   Appearance: She is well-developed.  ?Genitourinary:  ?   Vulva normal.  ?   Right Labia: No rash, tenderness or lesions. ?   Left Labia: No tenderness, lesions or rash. ?   No vaginal discharge, erythema or tenderness.  ? ?   Right Adnexa: not tender and no mass present. ?   Left Adnexa: not tender and no mass present. ?   No cervical friability or polyp.  ?   Uterus is not enlarged or tender.  ?Breasts: ?   Right: No mass, nipple discharge, skin change or tenderness.  ?   Left: No mass, nipple discharge, skin change or tenderness.  ?Neck:  ?   Thyroid: No thyromegaly.  ?Cardiovascular:  ?   Rate and Rhythm: Normal rate and regular rhythm.  ?   Heart sounds: Normal heart sounds. No murmur heard. ?Pulmonary:  ?   Effort: Pulmonary effort is normal.  ?   Breath sounds: Normal breath sounds.  ?Abdominal:  ?   Palpations: Abdomen is soft.  ?   Tenderness: There is no abdominal tenderness. There is no guarding or rebound.  ?Musculoskeletal:     ?   General: Normal range of motion.  ?   Cervical back: Normal range of motion.   ?Lymphadenopathy:  ?   Cervical: No cervical adenopathy.  ?Neurological:  ?   General: No focal deficit present.  ?   Mental Status: She is alert and oriented to person, place, and time.  ?   Cranial Nerves: No cranial nerve deficit.  ?Skin: ?   General: Skin is warm and dry.  ?Psychiatric:     ?   Mood and Affect: Mood normal.     ?   Behavior: Behavior normal.     ?   Thought Content: Thought content  normal.     ?   Judgment: Judgment normal.  ?Vitals reviewed.  ? ? ?Assessment/Plan: ? ?Encounter for annual routine gynecological examination ? ?Encounter for screening mammogram for malignant neoplasm of breast - Plan: MM 3D SCREEN BREAST BILATERAL; pt to schedule mammo for 5/23 ? ?        ?GYN counsel breast self exam, mammography screening, menopause, adequate intake of calcium and vitamin D, diet and exercise ? ?  F/U ? Return in about 1 year (around 07/12/2022). ? ?Shane Badeaux B. Yael Coppess, PA-C ?07/11/2021 ?3:48 PM ?

## 2021-07-24 ENCOUNTER — Other Ambulatory Visit: Payer: Self-pay

## 2021-08-22 ENCOUNTER — Other Ambulatory Visit: Payer: Self-pay

## 2021-08-30 ENCOUNTER — Encounter: Payer: Self-pay | Admitting: Family

## 2021-08-30 ENCOUNTER — Ambulatory Visit: Payer: BC Managed Care – PPO | Admitting: Family

## 2021-08-30 ENCOUNTER — Other Ambulatory Visit: Payer: Self-pay

## 2021-08-30 VITALS — BP 124/78 | HR 65 | Temp 98.1°F | Ht 67.0 in | Wt 214.0 lb

## 2021-08-30 DIAGNOSIS — E119 Type 2 diabetes mellitus without complications: Secondary | ICD-10-CM

## 2021-08-30 DIAGNOSIS — E785 Hyperlipidemia, unspecified: Secondary | ICD-10-CM

## 2021-08-30 DIAGNOSIS — I10 Essential (primary) hypertension: Secondary | ICD-10-CM | POA: Diagnosis not present

## 2021-08-30 LAB — POCT GLYCOSYLATED HEMOGLOBIN (HGB A1C): Hemoglobin A1C: 7.5 % — AB (ref 4.0–5.6)

## 2021-08-30 MED ORDER — TRULICITY 4.5 MG/0.5ML ~~LOC~~ SOAJ
4.5000 mg | SUBCUTANEOUS | 3 refills | Status: DC
  Filled 2021-08-30: qty 2, 28d supply, fill #0
  Filled 2021-10-18: qty 2, 28d supply, fill #1
  Filled 2021-11-15: qty 2, 28d supply, fill #2
  Filled 2021-12-18: qty 2, 28d supply, fill #3
  Filled 2022-01-17: qty 2, 28d supply, fill #4
  Filled 2022-02-12: qty 2, 28d supply, fill #5

## 2021-08-30 NOTE — Progress Notes (Signed)
Subjective:    Patient ID: Cassandra [name], female    DOB: [birth year], 56 y.o.   MRN: [id number]  CC: Cassandra [name] is a 56 y.o. female who presents today for follow up.   HPI: Feels well today.  No new complaints  DM-compliant with metformin 1000 mg once daily, Jardiance 25 mg, Trulicity 3 mg Hypertension-compliant with lisinopril 10 mg HLD-compliant with Lipitor 20mg   HISTORY:  Past Medical History:  Diagnosis Date   Chicken pox    Diabetes mellitus without complication (HCC)    Herpes    Hypertension    Past Surgical History:  Procedure Laterality Date   CHOLECYSTECTOMY  2015   HERNIA REPAIR     56 years old   TUBAL LIGATION  1991   Family History  Problem Relation Age of Onset   Hypertension Mother    Diabetes Mother    Breast cancer Sister 78   Thyroid cancer Neg Hx     Allergies: Eggs or egg-derived products and Glipizide Current Outpatient Medications on File Prior to Visit  Medication Sig Dispense Refill   atorvastatin (LIPITOR) 20 MG tablet TAKE 1 TABLET BY MOUTH DAILY. 90 tablet 3   empagliflozin (JARDIANCE) 25 MG TABS tablet Take 1 tablet (25 mg total) by mouth daily before breakfast. 90 tablet 1   lisinopril (ZESTRIL) 10 MG tablet TAKE 1 TABLET BY MOUTH DAILY. 90 tablet 3   metFORMIN (GLUCOPHAGE-XR) 500 MG 24 hr tablet TAKE 2 TABLETS BY MOUTH 2 TIMES DAILY WITH A MEAL (Patient taking differently: 2 TABLETS DAILY) 360 tablet 1   omeprazole (PRILOSEC) 20 MG capsule Take 1 capsule (20 mg total) by mouth daily. 90 capsule 3   No current facility-administered medications on file prior to visit.    Social History   Tobacco Use   Smoking status: Never   Smokeless tobacco: Never  Vaping Use   Vaping Use: Never used  Substance Use Topics   Alcohol use: No    Alcohol/week: 0.0 standard drinks   Drug use: No    Review of Systems  Constitutional:  Negative for chills and fever.  Respiratory:  Negative for cough.   Cardiovascular:  Negative for  chest pain and palpitations.  Gastrointestinal:  Negative for nausea and vomiting.     Objective:    BP 124/78 (BP Location: Left Arm, Patient Position: Sitting, Cuff Size: Large)   Pulse 65   Temp 98.1 F (36.7 C) (Oral)   Ht 5\' 7"  (1.702 m)   Wt 214 lb (97.1 kg)   LMP  (LMP Unknown)   SpO2 97%   BMI 33.52 kg/m  BP Readings from Last 3 Encounters:  08/30/21 124/78  07/11/21 120/70  05/31/21 102/61   Wt Readings from Last 3 Encounters:  08/30/21 214 lb (97.1 kg)  07/11/21 213 lb (96.6 kg)  05/31/21 211 lb (95.7 kg)    Physical Exam Vitals reviewed.  Constitutional:      Appearance: She is well-developed.  Eyes:     Conjunctiva/sclera: Conjunctivae normal.  Cardiovascular:     Rate and Rhythm: Normal rate and regular rhythm.     Pulses: Normal pulses.     Heart sounds: Normal heart sounds.  Pulmonary:     Effort: Pulmonary effort is normal.     Breath sounds: Normal breath sounds. No wheezing, rhonchi or rales.  Skin:    General: Skin is warm and dry.  Neurological:     Mental Status: She is alert.  Psychiatric:  Speech: Speech normal.        Behavior: Behavior normal.        Thought Content: Thought content normal.       Assessment & Plan:   Problem List Items Addressed This Visit       Cardiovascular and Mediastinum   Benign essential HTN    Chronic, stable.  Continue lisinopril 10 mg         Endocrine   Diabetes mellitus type 2, controlled, without complications (HCC) - Primary    Lab Results  Component Value Date   HGBA1C 7.5 (A) 08/30/2021  Uncontrolled.  Increase Trulicity to 4.5 mg.  Continue metformin 1000 mg once daily, Jardiance 25 mg.      Relevant Medications   Dulaglutide (TRULICITY) 4.5 MG/0.5ML SOPN   Other Relevant Orders   POCT HgB A1C (Completed)     Other   Hyperlipidemia    Lab Results  Component Value Date   CHOL 146 11/30/2020   HDL 59.00 11/30/2020   LDLCALC 70 11/30/2020   TRIG 81.0 11/30/2020   CHOLHDL 2  11/30/2020   Lab Results  Component Value Date   LDLCALC 70 11/30/2020  LDL at goal.  Continue Lipitor 20 mg        I have discontinued Cassandra [name]'s Trulicity. I am also having her start on Trulicity. Additionally, I am having her maintain her metFORMIN, omeprazole, empagliflozin, lisinopril, and atorvastatin.   Meds ordered this encounter  Medications   Dulaglutide (TRULICITY) 4.5 MG/0.5ML SOPN    Sig: Inject 4.5 mg as directed once a week.    Dispense:  3 mL    Refill:  3    Order Specific Question:   Supervising Provider    Answer:   Sherlene Shams [[id number]]    Return precautions given.   Risks, benefits, and alternatives of the medications and treatment plan prescribed today were discussed, and patient expressed understanding.   Education regarding symptom management and diagnosis given to patient on AVS.  Continue to follow with Allegra Grana, FNP for routine health maintenance.   Althea D Dolder and I agreed with plan.   Rennie Plowman, FNP

## 2021-09-04 NOTE — Assessment & Plan Note (Signed)
Chronic, stable.  Continue lisinopril 10mg 

## 2021-09-04 NOTE — Assessment & Plan Note (Signed)
Lab Results  Component Value Date   HGBA1C 7.5 (A) 08/30/2021   Uncontrolled.  Increase Trulicity to 4.5 mg.  Continue metformin 1000 mg once daily, Jardiance 25 mg.

## 2021-09-04 NOTE — Assessment & Plan Note (Signed)
Lab Results  Component Value Date   CHOL 146 11/30/2020   HDL 59.00 11/30/2020   LDLCALC 70 11/30/2020   TRIG 81.0 11/30/2020   CHOLHDL 2 11/30/2020   Lab Results  Component Value Date   LDLCALC 70 11/30/2020   LDL at goal.  Continue Lipitor 20 mg

## 2021-09-19 ENCOUNTER — Other Ambulatory Visit: Payer: Self-pay

## 2021-09-19 ENCOUNTER — Other Ambulatory Visit: Payer: Self-pay | Admitting: Family

## 2021-09-19 DIAGNOSIS — R197 Diarrhea, unspecified: Secondary | ICD-10-CM

## 2021-09-19 DIAGNOSIS — E119 Type 2 diabetes mellitus without complications: Secondary | ICD-10-CM

## 2021-09-19 NOTE — Telephone Encounter (Signed)
Pt called in requesting refill on medication (metFORMIN (GLUCOPHAGE-XR) 500 MG 24 hr tablet)... Pt requesting callback

## 2021-09-20 ENCOUNTER — Other Ambulatory Visit: Payer: Self-pay

## 2021-09-20 MED FILL — Metformin HCl Tab ER 24HR 500 MG: ORAL | 90 days supply | Qty: 360 | Fill #0 | Status: AC

## 2021-09-30 IMAGING — MG MM DIGITAL SCREENING BILAT W/ TOMO AND CAD
8 series · 8 of 24 positions shown · non-contrast
Comparison: Previous exam(s).

CLINICAL DATA: Screening.

EXAM:
DIGITAL SCREENING BILATERAL MAMMOGRAM WITH TOMOSYNTHESIS AND CAD
TECHNIQUE: Bilateral screening digital craniocaudal and mediolateral oblique
mammograms were obtained. Bilateral screening digital breast
tomosynthesis was performed. The images were evaluated with
computer-aided detection.

[L CC synth-2D]
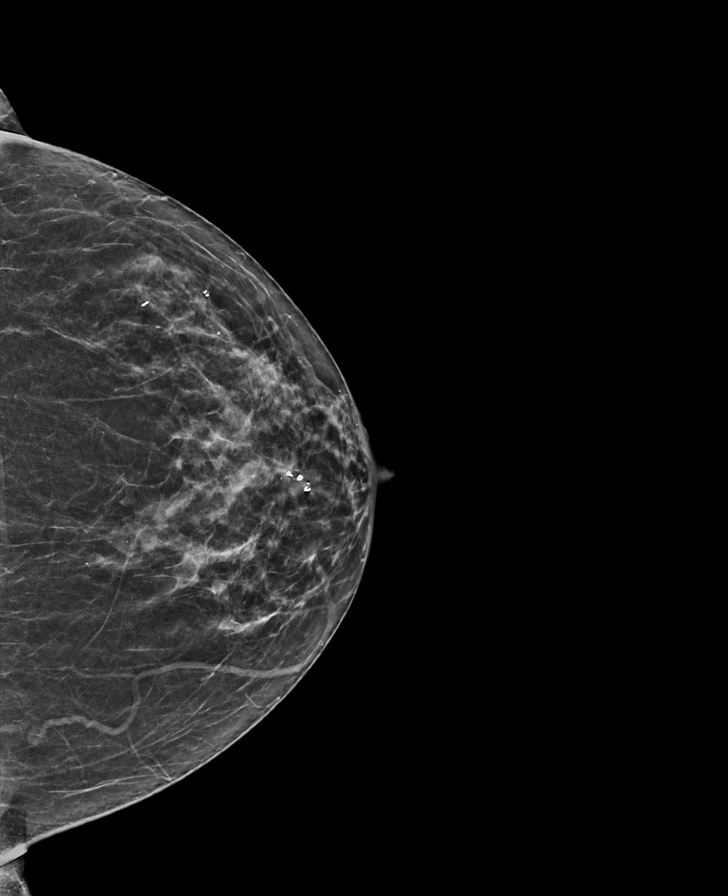

[L MLO synth-2D]
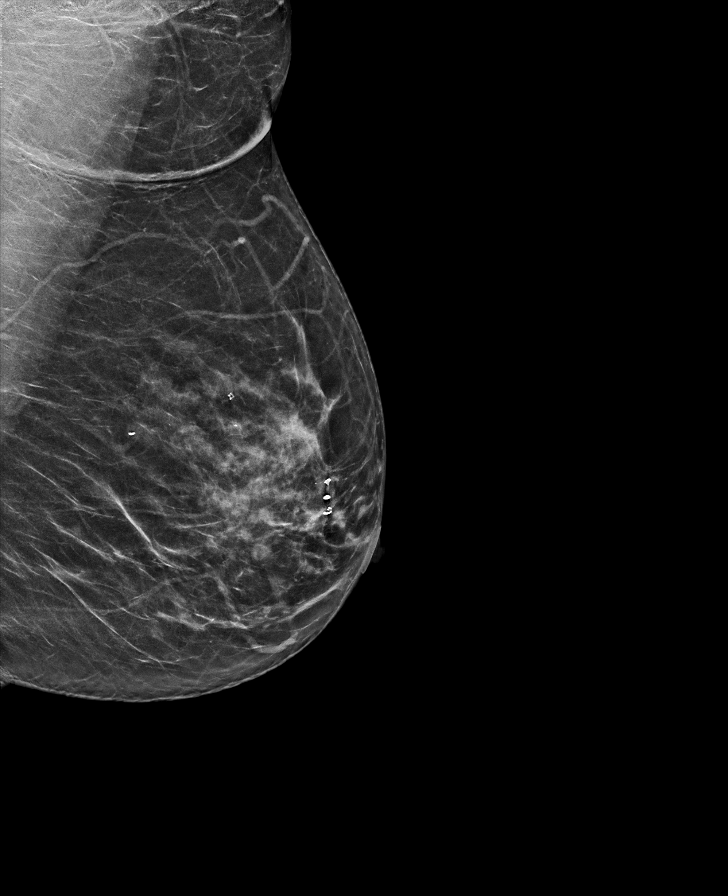

[R MLO synth-2D]
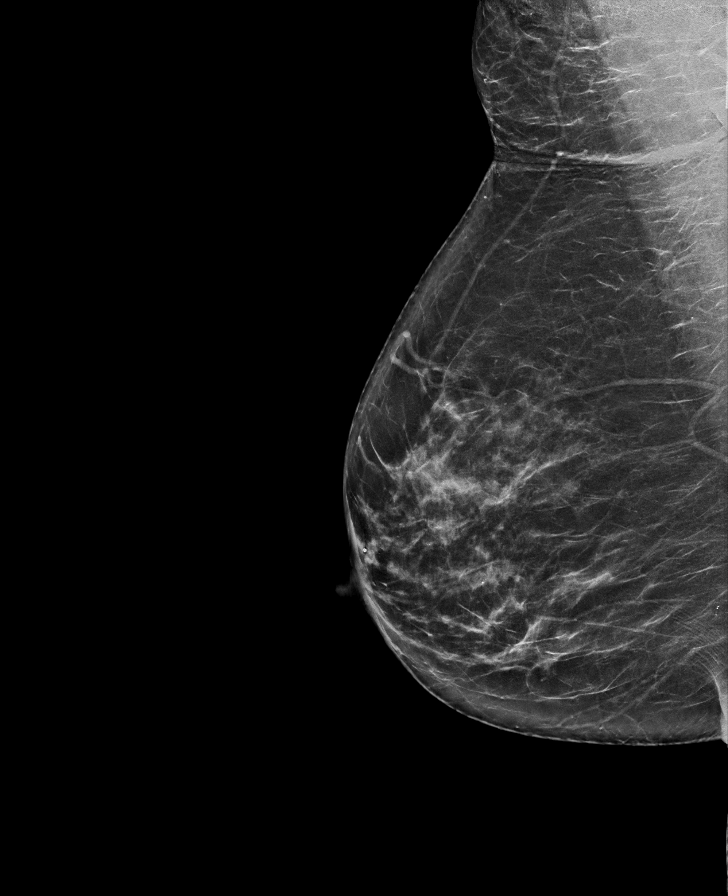

[R CC synth-2D]
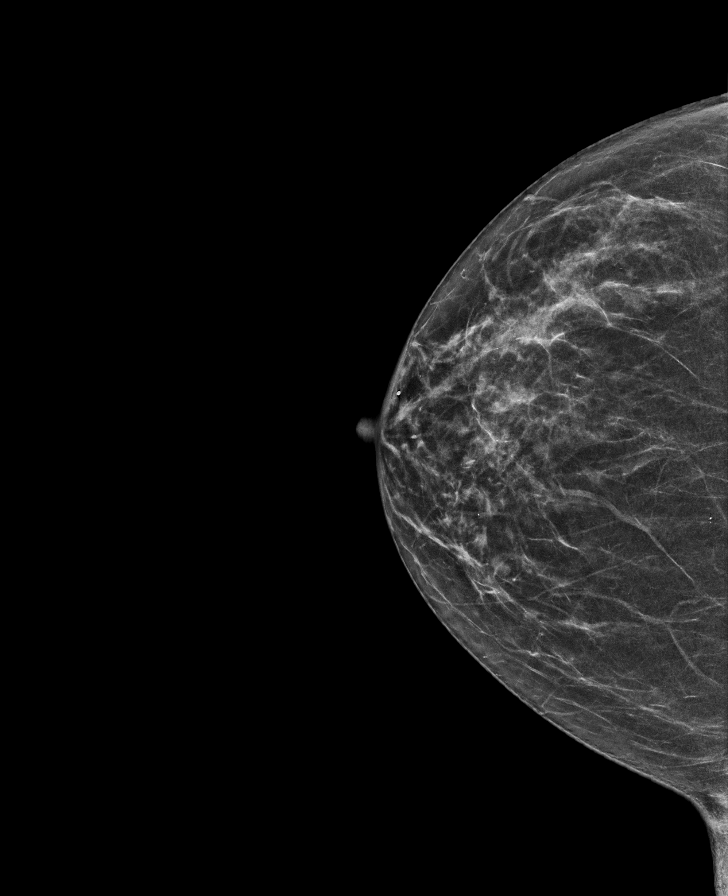

[L CC tomo · tomo slice 31/62.0]
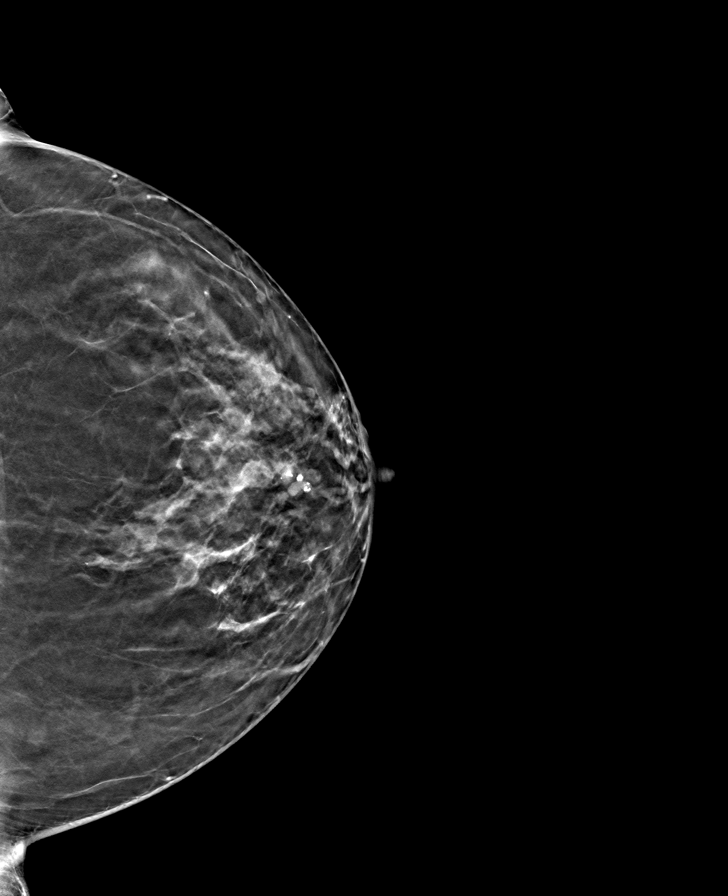

[R CC tomo · tomo slice 29/58.0]
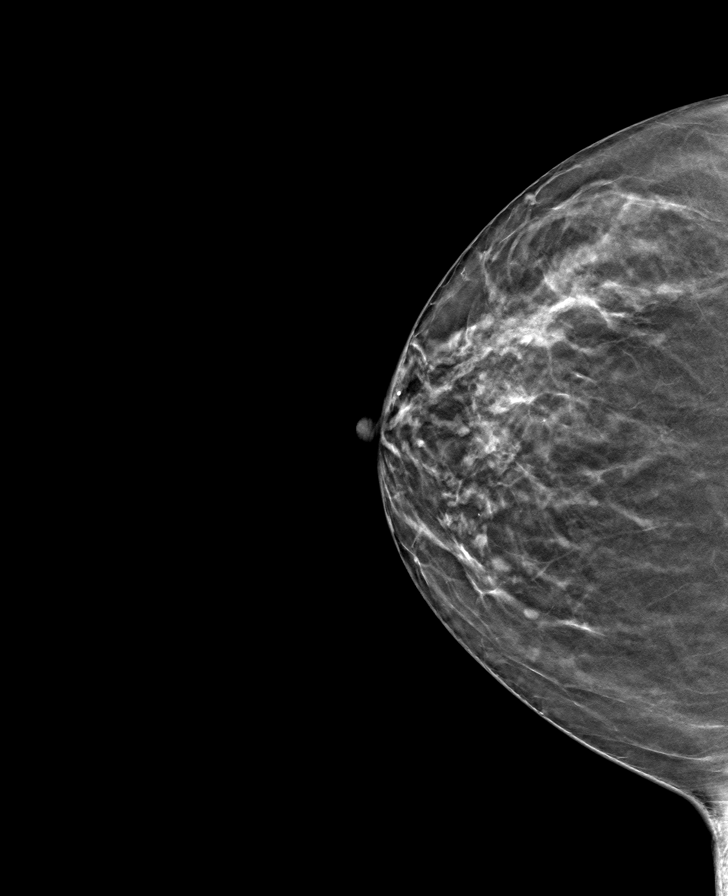

[R MLO tomo · tomo slice 38/75.0]
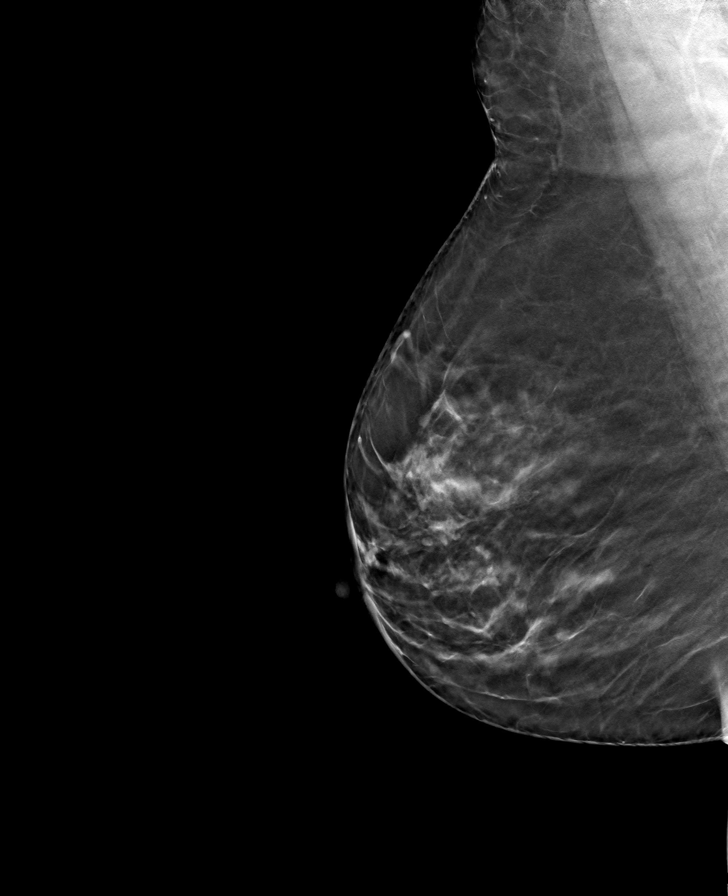

[L MLO tomo · tomo slice 35/69.0]
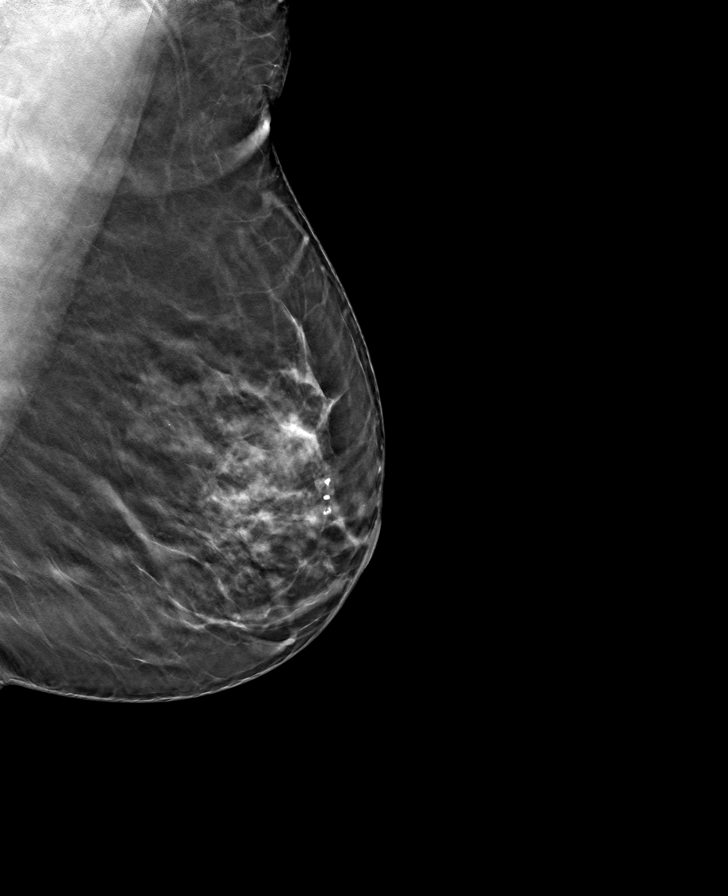

[8 of 24 positions shown; findings below may reference images not displayed]

ACR Breast Density Category c: The breast tissue is heterogeneously
dense, which may obscure small masses.
FINDINGS: In the right breast, a possible masses warrant further evaluation.
In the left breast, no findings suspicious for malignancy.
IMPRESSION: Further evaluation is suggested for a possible masses in the right
breast.

RECOMMENDATION:
Diagnostic mammogram and possibly ultrasound of the right breast.
(Code:[id number])

The patient will be contacted regarding the findings, and additional
imaging will be scheduled.

BI-RADS CATEGORY  0: Incomplete. Need additional imaging evaluation
and/or prior mammograms for comparison.

## 2021-10-03 ENCOUNTER — Ambulatory Visit
Admission: RE | Admit: 2021-10-03 | Discharge: 2021-10-03 | Disposition: A | Discharge status: Home / Self Care | Payer: BC Managed Care – PPO | Source: Ambulatory Visit | Attending: Obstetrics and Gynecology | Admitting: Obstetrics and Gynecology

## 2021-10-03 DIAGNOSIS — Z1231 Encounter for screening mammogram for malignant neoplasm of breast: Secondary | ICD-10-CM | POA: Diagnosis not present

## 2021-10-18 ENCOUNTER — Other Ambulatory Visit: Payer: Self-pay

## 2021-11-15 ENCOUNTER — Other Ambulatory Visit: Payer: Self-pay

## 2021-11-27 ENCOUNTER — Other Ambulatory Visit: Payer: Self-pay | Admitting: Family

## 2021-11-27 DIAGNOSIS — E119 Type 2 diabetes mellitus without complications: Secondary | ICD-10-CM

## 2021-11-29 ENCOUNTER — Encounter: Payer: Self-pay | Admitting: Family

## 2021-11-29 ENCOUNTER — Ambulatory Visit: Payer: BC Managed Care – PPO | Admitting: Family

## 2021-11-29 ENCOUNTER — Ambulatory Visit (INDEPENDENT_AMBULATORY_CARE_PROVIDER_SITE_OTHER): Payer: BC Managed Care – PPO

## 2021-11-29 VITALS — BP 110/62 | HR 97 | Temp 97.6°F | Ht 67.0 in | Wt 211.8 lb

## 2021-11-29 DIAGNOSIS — M542 Cervicalgia: Secondary | ICD-10-CM | POA: Diagnosis not present

## 2021-11-29 DIAGNOSIS — I10 Essential (primary) hypertension: Secondary | ICD-10-CM | POA: Diagnosis not present

## 2021-11-29 DIAGNOSIS — E119 Type 2 diabetes mellitus without complications: Secondary | ICD-10-CM

## 2021-11-29 LAB — COMPREHENSIVE METABOLIC PANEL
ALT: 16 U/L (ref 0–35)
AST: 16 U/L (ref 0–37)
Albumin: 4.2 g/dL (ref 3.5–5.2)
Alkaline Phosphatase: 90 U/L (ref 39–117)
BUN: 18 mg/dL (ref 6–23)
CO2: 25 mEq/L (ref 19–32)
Calcium: 9.3 mg/dL (ref 8.4–10.5)
Chloride: 103 mEq/L (ref 96–112)
Creatinine, Ser: 0.69 mg/dL (ref 0.40–1.20)
GFR: 97.93 mL/min (ref >60.00)
Glucose, Bld: 109 mg/dL — ABNORMAL HIGH (ref 70–99)
Potassium: 4.3 mEq/L (ref 3.5–5.1)
Sodium: 136 mEq/L (ref 135–145)
Total Bilirubin: 0.5 mg/dL (ref 0.2–1.2)
Total Protein: 7 g/dL (ref 6.0–8.3)

## 2021-11-29 LAB — CBC WITH DIFFERENTIAL/PLATELET
Basophils Absolute: 0 10*3/uL (ref 0.0–0.1)
Basophils Relative: 0.6 % (ref 0.0–3.0)
Eosinophils Absolute: 0.1 10*3/uL (ref 0.0–0.7)
Eosinophils Relative: 1.2 % (ref 0.0–5.0)
HCT: 42.3 % (ref 36.0–46.0)
Hemoglobin: 13.6 g/dL (ref 12.0–15.0)
Lymphocytes Relative: 34.9 % (ref 12.0–46.0)
Lymphs Abs: 1.9 10*3/uL (ref 0.7–4.0)
MCHC: 32 g/dL (ref 30.0–36.0)
MCV: 83.4 fl (ref 78.0–100.0)
Monocytes Absolute: 0.4 10*3/uL (ref 0.1–1.0)
Monocytes Relative: 7.4 % (ref 3.0–12.0)
Neutro Abs: 3 10*3/uL (ref 1.4–7.7)
Neutrophils Relative %: 55.9 % (ref 43.0–77.0)
Platelets: 245 10*3/uL (ref 150.0–400.0)
RBC: 5.08 Mil/uL (ref 3.87–5.11)
RDW: 14.2 % (ref 11.5–15.5)
WBC: 5.3 10*3/uL (ref 4.0–10.5)

## 2021-11-29 LAB — HEMOGLOBIN A1C: Hgb A1c MFr Bld: 8.2 % — ABNORMAL HIGH (ref 4.6–6.5)

## 2021-11-29 LAB — LIPID PANEL
Cholesterol: 156 mg/dL (ref 0–200)
HDL: 61.5 mg/dL (ref >39.00)
LDL Cholesterol: 76 mg/dL (ref 0–99)
NonHDL: 94.89
Total CHOL/HDL Ratio: 3
Triglycerides: 93 mg/dL (ref 0.0–149.0)
VLDL: 18.6 mg/dL (ref 0.0–40.0)

## 2021-11-29 LAB — MICROALBUMIN / CREATININE URINE RATIO
Creatinine,U: 88.9 mg/dL
Microalb Creat Ratio: 0.8 mg/g (ref 0.0–30.0)
Microalb, Ur: <0.7 mg/dL (ref 0.0–1.9)

## 2021-11-29 MED ORDER — CYCLOBENZAPRINE HCL 5 MG PO TABS: 5.0000 mg | ORAL_TABLET | Freq: Every evening | ORAL | 1 refills | Status: DC | PRN

## 2021-11-29 NOTE — Progress Notes (Signed)
Subjective:    Patient ID: Cassandra [name], female    DOB: Oct 09, 1965, 56 y.o.   MRN: [id number]  CC: Cassandra [name] is a 56 y.o. female who presents today for follow up.   HPI: Complains of dizziness which started insidiously approximately 3 weeks ago She feels dizziness when going from sitting to standing. No vertigo.  She is drinking water. Urine is clear but she noticed an odor. No abnormal vaginal discharge.     At the same time, she started to have posterior neck pain , HA which started in back of neck and radiates to front of head.  She thinks neck pain contributing to HA. She feels like muscle spasm. HA is not exertional. HA is not worse HA of life.  She has h/o migraine. No fever,N, v, vision changes,  injury, syncope, arm numbness.  She has taken aleve daily without relief. 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   Hypertension-compliant lisinopril 10 mg. No cp , sob.  Diabetes-compliant with Trulicity 4.5 mg, metformin 1000 mg daily, Jardiance 25 mg. FBG 119.    HISTORY:  Past Medical History:  Diagnosis Date   Chicken pox    Diabetes mellitus without complication (HCC)    Herpes    Hypertension    Past Surgical History:  Procedure Laterality Date   CHOLECYSTECTOMY  2015   HERNIA REPAIR     56 years old   TUBAL LIGATION  1991   Family History  Problem Relation Age of Onset   Hypertension Mother    Diabetes Mother    Breast cancer Sister 63   Thyroid cancer Neg Hx     Allergies: Eggs or egg-derived products and Glipizide Current Outpatient Medications on File Prior to Visit  Medication Sig Dispense Refill   atorvastatin (LIPITOR) 20 MG tablet TAKE 1 TABLET BY MOUTH DAILY. 90 tablet 3   Dulaglutide (TRULICITY) 4.5 MG/0.5ML SOPN Inject 4.5 mg as directed once a week. 3 mL 3   JARDIANCE 25 MG TABS tablet TAKE 1 TABLET BY MOUTH ONCE DAILY BEFORE BREAKFAST 90 tablet 0   lisinopril (ZESTRIL) 10 MG  tablet TAKE 1 TABLET BY MOUTH DAILY. 90 tablet 3   metFORMIN (GLUCOPHAGE-XR) 500 MG 24 hr tablet TAKE 2 TABLETS BY MOUTH 2 TIMES DAILY WITH A MEAL 360 tablet 1   omeprazole (PRILOSEC) 20 MG capsule Take 1 capsule (20 mg total) by mouth daily. 90 capsule 3   No current facility-administered medications on file prior to visit.    Social History   Tobacco Use   Smoking status: Never   Smokeless tobacco: Never  Vaping Use   Vaping Use: Never used  Substance Use Topics   Alcohol use: No    Alcohol/week: 0.0 standard drinks of alcohol   Drug use: No    Review of Systems  Constitutional:  Negative for chills and fever.  HENT:  Negative for congestion.   Eyes:  Negative for visual disturbance.  Respiratory:  Negative for cough.   Cardiovascular:  Negative for chest pain and palpitations.  Gastrointestinal:  Negative for nausea and vomiting.  Genitourinary:  Negative for dysuria and vaginal discharge.  Musculoskeletal:  Positive for neck pain.  Neurological:  Positive for dizziness. Negative for syncope and numbness.      Objective:    BP 110/62 (BP Location: Left Arm, Patient Position: Sitting, Cuff Size: Large)   Pulse 97   Temp 97.6 F (  36.4 C) (Oral)   Ht 5\' 7"  (1.702 m)   Wt 211 lb 12.8 oz (96.1 kg)   SpO2 97%   BMI 33.17 kg/m  BP Readings from Last 3 Encounters:  11/29/21 110/62  08/30/21 124/78  07/11/21 120/70   Wt Readings from Last 3 Encounters:  11/29/21 211 lb 12.8 oz (96.1 kg)  08/30/21 214 lb (97.1 kg)  07/11/21 213 lb (96.6 kg)    Physical Exam Vitals reviewed.  Constitutional:      Appearance: She is well-developed.  HENT:     Mouth/Throat:     Pharynx: Uvula midline.  Eyes:     Conjunctiva/sclera: Conjunctivae normal.     Pupils: Pupils are equal, round, and reactive to light.     Comments: Fundus normal bilaterally.   Cardiovascular:     Rate and Rhythm: Normal rate and regular rhythm.     Pulses: Normal pulses.     Heart sounds: Normal  heart sounds.  Pulmonary:     Effort: Pulmonary effort is normal.     Breath sounds: Normal breath sounds. No wheezing, rhonchi or rales.  Musculoskeletal:     Cervical back: Spasms and tenderness present. Normal range of motion.       Back:     Comments: Diffuse tenderness over supraspinatus and over cervical spine. No rash, edema.   Skin:    General: Skin is warm and dry.  Neurological:     Mental Status: She is alert.     Cranial Nerves: No cranial nerve deficit.     Sensory: No sensory deficit.     Deep Tendon Reflexes:     Reflex Scores:      Bicep reflexes are 2+ on the right side and 2+ on the left side.      Patellar reflexes are 2+ on the right side and 2+ on the left side.    Comments: Grip equal and strong bilateral upper extremities. Gait strong and steady. Able to perform rapid alternating movement without difficulty.   Psychiatric:        Speech: Speech normal.        Behavior: Behavior normal.        Thought Content: Thought content normal.        Assessment & Plan:   Problem List Items Addressed This Visit       Cardiovascular and Mediastinum   Benign essential HTN    Low end of normal today. Advised to suspend lisinopril. We will discuss at follow up whether to resume /if we can resume for renal protection in setting of DM. She may be able to tolerate lower dose.         Endocrine   Diabetes mellitus type 2, controlled, without complications (HCC)    Pending a1c. In setting of dizziness, we discussed trial decrease and/or suspension of jardiance if dizziness doesn't resolve with suspension of lisinopril. Close follow up.       Relevant Orders   Hemoglobin A1c   Comprehensive metabolic panel   Lipid panel   Microalbumin / creatinine urine ratio     Other   Neck pain - Primary    Reassuring neurologic exam.  She is not orthostatic on exam. Differential includes cervicogenic HA.  Stop aleve due to potential for rebound HA.  Start flexeril for  suspected muscle spasm.  Pending cervical and thoracic x-ray.  May consider nortriptyline at follow-up for headache prevention, pain.        Relevant Medications   cyclobenzaprine (  FLEXERIL) 5 MG tablet   Other Relevant Orders   Comprehensive metabolic panel   CBC with Differential/Platelet   DG Cervical Spine Complete   DG Thoracic Spine W/Swimmers     I am having Kiri D. Montone start on cyclobenzaprine. I am also having her maintain her omeprazole, lisinopril, atorvastatin, Trulicity, metFORMIN, and Jardiance.   Meds ordered this encounter  Medications   cyclobenzaprine (FLEXERIL) 5 MG tablet    Sig: Take 1-2 tablets (5-10 mg total) by mouth at bedtime as needed for muscle spasms.    Dispense:  30 tablet    Refill:  1    Order Specific Question:   Supervising Provider    Answer:   Sherlene Shams [[id number]]    Return precautions given.   Risks, benefits, and alternatives of the medications and treatment plan prescribed today were discussed, and patient expressed understanding.   Education regarding symptom management and diagnosis given to patient on AVS.  Continue to follow with Allegra Grana, FNP for routine health maintenance.   Anetra D Alford and I agreed with plan.   Rennie Plowman, FNP

## 2021-11-29 NOTE — Patient Instructions (Addendum)
Start flexeril which is muscle relaxant.   Do not drive or operate heavy machinery while on muscle relaxant. Please do not drink alcohol. Only take this medication as needed for acute muscle spasm at bedtime. This medication make you feel drowsy so be very careful.  Stop taking if become too drowsy or somnolent as this puts you at risk for falls. Please contact our office with any questions.   Stop aleve as it can create rebound headache.  Suspend /hold lisinopril 10mg  to see if dizziness improves  May consider stopping or decreasing jardiance as well due to dizziness.    Please let me know how you are doing and certainly if you have any new concerns

## 2021-11-29 NOTE — Assessment & Plan Note (Signed)
Low end of normal today. Advised to suspend lisinopril. We will discuss at follow up whether to resume /if we can resume for renal protection in setting of DM. She may be able to tolerate lower dose.

## 2021-11-29 NOTE — Assessment & Plan Note (Addendum)
Reassuring neurologic exam.  She is not orthostatic on exam. Differential includes cervicogenic HA.  Stop aleve due to potential for rebound HA.  Start flexeril for suspected muscle spasm.  Pending cervical and thoracic x-ray.  May consider nortriptyline at follow-up for headache prevention, pain.

## 2021-11-29 NOTE — Assessment & Plan Note (Signed)
Pending a1c. In setting of dizziness, we discussed trial decrease and/or suspension of jardiance if dizziness doesn't resolve with suspension of lisinopril. Close follow up.

## 2021-12-04 ENCOUNTER — Ambulatory Visit: Payer: BC Managed Care – PPO | Admitting: Family

## 2021-12-07 ENCOUNTER — Ambulatory Visit: Payer: BC Managed Care – PPO | Admitting: Family

## 2021-12-18 ENCOUNTER — Other Ambulatory Visit: Payer: Self-pay

## 2021-12-19 ENCOUNTER — Other Ambulatory Visit: Payer: Self-pay

## 2022-01-17 ENCOUNTER — Other Ambulatory Visit: Payer: Self-pay

## 2022-02-13 ENCOUNTER — Other Ambulatory Visit: Payer: Self-pay

## 2022-02-14 ENCOUNTER — Encounter: Payer: Self-pay | Admitting: Family

## 2022-02-14 ENCOUNTER — Ambulatory Visit: Payer: BC Managed Care – PPO | Admitting: Family

## 2022-02-14 VITALS — BP 118/72 | HR 74 | Temp 97.8°F | Ht 67.0 in | Wt 208.6 lb

## 2022-02-14 DIAGNOSIS — E785 Hyperlipidemia, unspecified: Secondary | ICD-10-CM | POA: Diagnosis not present

## 2022-02-14 DIAGNOSIS — E119 Type 2 diabetes mellitus without complications: Secondary | ICD-10-CM | POA: Diagnosis not present

## 2022-02-14 DIAGNOSIS — I10 Essential (primary) hypertension: Secondary | ICD-10-CM

## 2022-02-14 NOTE — Progress Notes (Signed)
Subjective:    Patient ID: Cassandra [name], female    DOB: 03-30-1966, 56 y.o.   MRN: [id number]  CC: Cassandra [name] is a 56 y.o. female who presents today for follow up.   HPI: Dizziness completely resolved. She feels well today. No complaints.   Seen 2 months ago and advised to suspend lisinopril due to low normal blood pressure.  Neck pain resolved.      Diabetes-compliant with Jardiance 25mg , Trulicity 4.5 mg, metformin 1000mg  bid.  Denies constipation, nausea Random glucose 150, 129, 106, 160. She hasnt taken FBG.  HISTORY:  Past Medical History:  Diagnosis Date   Chicken pox    Diabetes mellitus without complication (HCC)    Herpes    Hypertension    Past Surgical History:  Procedure Laterality Date   CHOLECYSTECTOMY  2015   HERNIA REPAIR     56 years old   TUBAL LIGATION  1991   Family History  Problem Relation Age of Onset   Hypertension Mother    Diabetes Mother    Breast cancer Sister 92   Thyroid cancer Neg Hx     Allergies: Eggs or egg-derived products and Glipizide Current Outpatient Medications on File Prior to Visit  Medication Sig Dispense Refill   atorvastatin (LIPITOR) 20 MG tablet TAKE 1 TABLET BY MOUTH DAILY. 90 tablet 3   Dulaglutide (TRULICITY) 4.5 MG/0.5ML SOPN Inject 4.5 mg as directed once a week. 3 mL 3   JARDIANCE 25 MG TABS tablet TAKE 1 TABLET BY MOUTH ONCE DAILY BEFORE BREAKFAST 90 tablet 0   metFORMIN (GLUCOPHAGE-XR) 500 MG 24 hr tablet TAKE 2 TABLETS BY MOUTH 2 TIMES DAILY WITH A MEAL 360 tablet 1   omeprazole (PRILOSEC) 20 MG capsule Take 1 capsule (20 mg total) by mouth daily. 90 capsule 3   No current facility-administered medications on file prior to visit.    Social History   Tobacco Use   Smoking status: Never   Smokeless tobacco: Never  Vaping Use   Vaping Use: Never used  Substance Use Topics   Alcohol use: No    Alcohol/week: 0.0 standard drinks of alcohol   Drug use: No    Review of Systems   Constitutional:  Negative for chills and fever.  Respiratory:  Negative for cough.   Cardiovascular:  Negative for chest pain and palpitations.  Gastrointestinal:  Negative for nausea and vomiting.  Musculoskeletal:  Negative for neck pain.  Neurological:  Negative for dizziness.      Objective:    BP 118/72 (BP Location: Left Arm, Patient Position: Sitting, Cuff Size: Normal)   Pulse 74   Temp 97.8 F (36.6 C) (Oral)   Ht 5\' 7"  (1.702 m)   Wt 208 lb 9.6 oz (94.6 kg)   SpO2 98%   BMI 32.67 kg/m  BP Readings from Last 3 Encounters:  02/14/22 118/72  11/29/21 110/62  08/30/21 124/78   Wt Readings from Last 3 Encounters:  02/14/22 208 lb 9.6 oz (94.6 kg)  11/29/21 211 lb 12.8 oz (96.1 kg)  08/30/21 214 lb (97.1 kg)    Physical Exam Vitals reviewed.  Constitutional:      Appearance: She is well-developed.  Eyes:     Conjunctiva/sclera: Conjunctivae normal.  Cardiovascular:     Rate and Rhythm: Normal rate and regular rhythm.     Pulses: Normal pulses.     Heart sounds: Normal heart sounds.  Pulmonary:     Effort: Pulmonary effort is normal.  Breath sounds: Normal breath sounds. No wheezing, rhonchi or rales.  Skin:    General: Skin is warm and dry.  Neurological:     Mental Status: She is alert.  Psychiatric:        Speech: Speech normal.        Behavior: Behavior normal.        Thought Content: Thought content normal.        Assessment & Plan:   Problem List Items Addressed This Visit       Cardiovascular and Mediastinum   Benign essential HTN    Excellent control.  Dizziness has resolved with suspension of lisinopril.  No history of microalbuminuria however we will continue to monitor.        Endocrine   Diabetes mellitus type 2, controlled, without complications ([place]) - Primary    Lab Results  Component Value Date   HGBA1C 8.2 (H) 11/29/2021  Anticipate improved.  She is not due for A1c.  Continue jardiance 25mg , Trulicity 4.5 mg, metformin  1000mg  bid.        Other   Hyperlipidemia    LDL 76 Excellent control.  Continue Lipitor 20 mg         I have discontinued Cassandra [name]'s lisinopril and cyclobenzaprine. I am also having her maintain her omeprazole, atorvastatin, Trulicity, metFORMIN, and Jardiance.   No orders of the defined types were placed in this encounter.   Return precautions given.   Risks, benefits, and alternatives of the medications and treatment plan prescribed today were discussed, and patient expressed understanding.   Education regarding symptom management and diagnosis given to patient on AVS.  Continue to follow with Cassandra Hawthorne, FNP for routine health maintenance.   Cassandra [name] and I agreed with plan.   Cassandra Paris, FNP

## 2022-02-16 NOTE — Assessment & Plan Note (Addendum)
Lab Results  Component Value Date   HGBA1C 8.2 (H) 11/29/2021   Anticipate improved.  She is not due for A1c.  Continue jardiance 25mg , Trulicity 4.5 mg, metformin 1000mg  bid.

## 2022-02-16 NOTE — Assessment & Plan Note (Signed)
LDL 76 Excellent control.  Continue Lipitor 20 mg

## 2022-02-16 NOTE — Assessment & Plan Note (Signed)
Excellent control.  Dizziness has resolved with suspension of lisinopril.  No history of microalbuminuria however we will continue to monitor.

## 2022-03-13 ENCOUNTER — Other Ambulatory Visit: Payer: Self-pay | Admitting: Family

## 2022-03-13 ENCOUNTER — Other Ambulatory Visit: Payer: Self-pay

## 2022-03-13 DIAGNOSIS — E119 Type 2 diabetes mellitus without complications: Secondary | ICD-10-CM

## 2022-03-13 MED ORDER — TRULICITY 4.5 MG/0.5ML ~~LOC~~ SOAJ
4.5000 mg | SUBCUTANEOUS | 3 refills | Status: DC
  Filled 2022-03-13: qty 2, 28d supply, fill #0
  Filled 2022-04-06: qty 2, 28d supply, fill #1
  Filled 2022-05-09: qty 2, 28d supply, fill #2
  Filled 2022-06-04: qty 2, 28d supply, fill #3

## 2022-03-13 MED FILL — Metformin HCl Tab ER 24HR 500 MG: ORAL | 90 days supply | Qty: 360 | Fill #1 | Status: AC

## 2022-03-14 ENCOUNTER — Other Ambulatory Visit: Payer: Self-pay

## 2022-03-14 MED ORDER — CYCLOBENZAPRINE HCL 5 MG PO TABS
ORAL_TABLET | ORAL | 0 refills | Status: AC
  Filled 2022-03-14: qty 30, 15d supply, fill #0

## 2022-03-16 ENCOUNTER — Other Ambulatory Visit: Payer: Self-pay

## 2022-03-18 ENCOUNTER — Other Ambulatory Visit: Payer: Self-pay

## 2022-03-19 ENCOUNTER — Other Ambulatory Visit: Payer: Self-pay

## 2022-03-21 ENCOUNTER — Other Ambulatory Visit: Payer: Self-pay

## 2022-03-22 ENCOUNTER — Other Ambulatory Visit: Payer: Self-pay

## 2022-03-22 DIAGNOSIS — K219 Gastro-esophageal reflux disease without esophagitis: Secondary | ICD-10-CM

## 2022-03-22 MED ORDER — OMEPRAZOLE 20 MG PO CPDR
20.0000 mg | DELAYED_RELEASE_CAPSULE | Freq: Every day | ORAL | 3 refills | Status: DC
  Filled 2022-03-22: qty 90, 90d supply, fill #0

## 2022-03-26 ENCOUNTER — Other Ambulatory Visit: Payer: Self-pay | Admitting: Family

## 2022-03-26 ENCOUNTER — Other Ambulatory Visit: Payer: Self-pay

## 2022-03-26 DIAGNOSIS — E119 Type 2 diabetes mellitus without complications: Secondary | ICD-10-CM

## 2022-03-27 ENCOUNTER — Other Ambulatory Visit: Payer: Self-pay

## 2022-03-27 MED FILL — Empagliflozin Tab 25 MG: ORAL | 90 days supply | Qty: 90 | Fill #0 | Status: AC

## 2022-03-28 ENCOUNTER — Other Ambulatory Visit: Payer: Self-pay

## 2022-04-12 ENCOUNTER — Telehealth: Payer: BC Managed Care – PPO | Admitting: Family Medicine

## 2022-04-12 ENCOUNTER — Other Ambulatory Visit: Payer: Self-pay

## 2022-04-12 DIAGNOSIS — B9689 Other specified bacterial agents as the cause of diseases classified elsewhere: Secondary | ICD-10-CM

## 2022-04-12 DIAGNOSIS — J019 Acute sinusitis, unspecified: Secondary | ICD-10-CM

## 2022-04-12 MED ORDER — AMOXICILLIN-POT CLAVULANATE 875-125 MG PO TABS
1.0000 | ORAL_TABLET | Freq: Two times a day (BID) | ORAL | 0 refills | Status: AC
  Filled 2022-04-12: qty 4, 2d supply, fill #0
  Filled 2022-04-12: qty 10, 5d supply, fill #0

## 2022-04-12 NOTE — Progress Notes (Signed)

## 2022-05-21 ENCOUNTER — Encounter: Payer: Self-pay | Admitting: Family

## 2022-05-21 ENCOUNTER — Ambulatory Visit (INDEPENDENT_AMBULATORY_CARE_PROVIDER_SITE_OTHER): Payer: BC Managed Care – PPO | Admitting: Family

## 2022-05-21 ENCOUNTER — Telehealth: Payer: Self-pay | Admitting: Family

## 2022-05-21 ENCOUNTER — Other Ambulatory Visit (HOSPITAL_COMMUNITY)
Admission: RE | Admit: 2022-05-21 | Discharge: 2022-05-21 | Disposition: A | Discharge status: Home / Self Care | Payer: BC Managed Care – PPO | Source: Ambulatory Visit | Attending: Family | Admitting: Family

## 2022-05-21 VITALS — BP 122/78 | HR 77 | Temp 97.7°F | Ht 68.0 in | Wt 218.2 lb

## 2022-05-21 DIAGNOSIS — E119 Type 2 diabetes mellitus without complications: Secondary | ICD-10-CM | POA: Diagnosis not present

## 2022-05-21 DIAGNOSIS — N898 Other specified noninflammatory disorders of vagina: Secondary | ICD-10-CM | POA: Insufficient documentation

## 2022-05-21 DIAGNOSIS — R7309 Other abnormal glucose: Secondary | ICD-10-CM | POA: Diagnosis not present

## 2022-05-21 DIAGNOSIS — Z0000 Encounter for general adult medical examination without abnormal findings: Secondary | ICD-10-CM | POA: Diagnosis not present

## 2022-05-21 DIAGNOSIS — Z8639 Personal history of other endocrine, nutritional and metabolic disease: Secondary | ICD-10-CM | POA: Diagnosis not present

## 2022-05-21 LAB — BASIC METABOLIC PANEL
BUN: 13 mg/dL (ref 6–23)
CO2: 26 mEq/L (ref 19–32)
Calcium: 9.3 mg/dL (ref 8.4–10.5)
Chloride: 103 mEq/L (ref 96–112)
Creatinine, Ser: 0.65 mg/dL (ref 0.40–1.20)
GFR: 99.02 mL/min (ref >60.00)
Glucose, Bld: 146 mg/dL — ABNORMAL HIGH (ref 70–99)
Potassium: 4.4 mEq/L (ref 3.5–5.1)
Sodium: 138 mEq/L (ref 135–145)

## 2022-05-21 LAB — POCT GLYCOSYLATED HEMOGLOBIN (HGB A1C): Hemoglobin A1C: 8.2 % — AB (ref 4.0–5.6)

## 2022-05-21 LAB — VITAMIN D 25 HYDROXY (VIT D DEFICIENCY, FRACTURES): VITD: 39.56 ng/mL (ref 30.00–100.00)

## 2022-05-21 NOTE — Assessment & Plan Note (Signed)
Lab Results  Component Value Date   HGBA1C 8.2 (A) 05/21/2022   Uncontrolled.  Patient prefers to increase metformin from 1000 mg daily to 1500 mg daily.  2000 mg daily of metformin caused loose stools.  She will continue Jardiance 25 mg daily, Trulicity 4.5 mg once weekly.  She declines insulin therapy at this time.  Encouraged her to monitor fasting blood glucose 3-4 times per week.  Close follow-up

## 2022-05-21 NOTE — Patient Instructions (Addendum)
Increase metformin to 1500mg  daily ( 3 tablets). Monitor for loose stools.   Check fasting blood glucose 3-4 times per week, goal is less than 120.    Call Rod Can, midwife, [phone] to schedule pap smear.    Health Maintenance for Postmenopausal Women Menopause is a normal process in which your ability to get pregnant comes to an end. This process happens slowly over many months or years, usually between the ages of 7 and 60. Menopause is complete when you have missed your menstrual period for 12 months. It is important to talk with your health care provider about some of the most common conditions that affect women after menopause (postmenopausal women). These include heart disease, cancer, and bone loss (osteoporosis). Adopting a healthy lifestyle and getting preventive care can help to promote your health and wellness. The actions you take can also lower your chances of developing some of these common conditions. What are the signs and symptoms of menopause? During menopause, you may have the following symptoms: Hot flashes. These can be moderate or severe. Night sweats. Decrease in sex drive. Mood swings. Headaches. Tiredness (fatigue). Irritability. Memory problems. Problems falling asleep or staying asleep. Talk with your health care provider about treatment options for your symptoms. Do I need hormone replacement therapy? Hormone replacement therapy is effective in treating symptoms that are caused by menopause, such as hot flashes and night sweats. Hormone replacement carries certain risks, especially as you become older. If you are thinking about using estrogen or estrogen with progestin, discuss the benefits and risks with your health care provider. How can I reduce my risk for heart disease and stroke? The risk of heart disease, heart attack, and stroke increases as you age. One of the causes may be a change in the body's hormones during menopause. This can affect  how your body uses dietary fats, triglycerides, and cholesterol. Heart attack and stroke are medical emergencies. There are many things that you can do to help prevent heart disease and stroke. Watch your blood pressure High blood pressure causes heart disease and increases the risk of stroke. This is more likely to develop in people who have high blood pressure readings or are overweight. Have your blood pressure checked: Every 3-5 years if you are 57-55 years of age. Every year if you are 57 years old or older. Eat a healthy diet  Eat a diet that includes plenty of vegetables, fruits, low-fat dairy products, and lean protein. Do not eat a lot of foods that are high in solid fats, added sugars, or sodium. Get regular exercise Get regular exercise. This is one of the most important things you can do for your health. Most adults should: Try to exercise for at least 150 minutes each week. The exercise should increase your heart rate and make you sweat (moderate-intensity exercise). Try to do strengthening exercises at least twice each week. Do these in addition to the moderate-intensity exercise. Spend less time sitting. Even light physical activity can be beneficial. Other tips Work with your health care provider to achieve or maintain a healthy weight. Do not use any products that contain nicotine or tobacco. These products include cigarettes, chewing tobacco, and vaping devices, such as e-cigarettes. If you need help quitting, ask your health care provider. Know your numbers. Ask your health care provider to check your cholesterol and your blood sugar (glucose). Continue to have your blood tested as directed by your health care provider. Do I need screening for cancer? Depending on  on your health history and family history, you may need to have cancer screenings at different stages of your life. This may include screening for: Breast cancer. Cervical cancer. Lung cancer. Colorectal  cancer. What is my risk for osteoporosis? After menopause, you may be at increased risk for osteoporosis. Osteoporosis is a condition in which bone destruction happens more quickly than new bone creation. To help prevent osteoporosis or the bone fractures that can happen because of osteoporosis, you may take the following actions: If you are 19-50 years old, get at least 1,000 mg of calcium and at least 600 international units (IU) of vitamin D per day. If you are older than age 50 but younger than age 70, get at least 1,200 mg of calcium and at least 600 international units (IU) of vitamin D per day. If you are older than age 70, get at least 1,200 mg of calcium and at least 800 international units (IU) of vitamin D per day. Smoking and drinking excessive alcohol increase the risk of osteoporosis. Eat foods that are rich in calcium and vitamin D, and do weight-bearing exercises several times each week as directed by your health care provider. How does menopause affect my mental health? Depression may occur at any age, but it is more common as you become older. Common symptoms of depression include: Feeling depressed. Changes in sleep patterns. Changes in appetite or eating patterns. Feeling an overall lack of motivation or enjoyment of activities that you previously enjoyed. Frequent crying spells. Talk with your health care provider if you think that you are experiencing any of these symptoms. General instructions See your health care provider for regular wellness exams and vaccines. This may include: Scheduling regular health, dental, and eye exams. Getting and maintaining your vaccines. These include: Influenza vaccine. Get this vaccine each year before the flu season begins. Pneumonia vaccine. Shingles vaccine. Tetanus, diphtheria, and pertussis (Tdap) booster vaccine. Your health care provider may also recommend other immunizations. Tell your health care provider if you have ever been  abused or do not feel safe at home. Summary Menopause is a normal process in which your ability to get pregnant comes to an end. This condition causes hot flashes, night sweats, decreased interest in sex, mood swings, headaches, or lack of sleep. Treatment for this condition may include hormone replacement therapy. Take actions to keep yourself healthy, including exercising regularly, eating a healthy diet, watching your weight, and checking your blood pressure and blood sugar levels. Get screened for cancer and depression. Make sure that you are up to date with all your vaccines. This information is not intended to replace advice given to you by your health care provider. Make sure you discuss any questions you have with your health care provider. Document Revised: 08/22/2020 Document Reviewed: 08/22/2020 Elsevier Patient Education  2023 Elsevier Inc.  

## 2022-05-21 NOTE — Telephone Encounter (Signed)
Called [facility] and pt has never been seen there in their office.

## 2022-05-21 NOTE — Progress Notes (Signed)
Assessment & Plan:  Routine physical examination Assessment & Plan: Deferred pelvic exam as patient is currently following with GYN.  She understands to make that an appointment for Pap smear.  Clinical breast exam performed today.  Call to [facility] clinic to request colonoscopy report.   Elevated glucose -     POCT glycosylated hemoglobin (Hb A1C)  Vaginal odor -     Cervicovaginal ancillary only  Controlled type 2 diabetes mellitus without complication, without long-term current use of insulin (HCC) Assessment & Plan: Lab Results  Component Value Date   HGBA1C 8.2 (A) 05/21/2022   Uncontrolled.  Patient prefers to increase metformin from 1000 mg daily to 1500 mg daily.  2000 mg daily of metformin caused loose stools.  She will continue Jardiance 25 mg daily, Trulicity 4.5 mg once weekly.  She declines insulin therapy at this time.  Encouraged her to monitor fasting blood glucose 3-4 times per week.  Close follow-up  Orders: -     Basic metabolic panel  History of vitamin D deficiency -     VITAMIN D 25 Hydroxy (Vit-D Deficiency, Fractures)     Return precautions given.   Risks, benefits, and alternatives of the medications and treatment plan prescribed today were discussed, and patient expressed understanding.   Education regarding symptom management and diagnosis given to patient on AVS either electronically or printed.  Return in about 3 months (around 08/19/2022).  Mable Paris, FNP  Subjective:    Patient ID: Josue Hector, female    DOB: 09/26/1965, 57 y.o.   MRN: [id number]  CC: MIANNA IEZZI is a 57 y.o. female who presents today for physical exam.    HPI: Complains of vaginal odor x 8 weeks.  No dysuria, fever , dysuria, vaginal bleeding or abnormal vaginal discharge.   Treated with augmentin 6 weeks ago.      DM- compliant with jardiance 25mg , Trulicity 4.5 mg, metformin 1000mg  QD. She has loose stool on meformin 2000mg  qd.  Post prandial  119 after breakfast.  Endorses dietary indiscretion, recent weight gain.   Colorectal Cancer Screening: UTD , abstracted 10/26/2013. Reports done   Breast Cancer Screening: Mammogram UTD Cervical Cancer Screening: due, 06/04/2017 negative HPV,NIL. She follows with GYN  Bone Health screening/DEXA for 65+: No increased fracture risk. Defer screening at this time.        Tetanus - UTD        Labs: Screening labs today. Exercise: No regular exercise.   Alcohol use:  none Smoking/tobacco use: Nonsmoker.    Health Maintenance  Topic Date Due   COVID-19 Vaccine (5 - 2023-24 season) 12/15/2021   FOOT EXAM  03/08/2022   PAP SMEAR-Modifier  06/03/2022   OPHTHALMOLOGY EXAM  07/16/2022   HEMOGLOBIN A1C  11/19/2022   Diabetic kidney evaluation - eGFR measurement  11/30/2022   Diabetic kidney evaluation - Urine ACR  11/30/2022   MAMMOGRAM  10/04/2023   COLONOSCOPY (Pts 45-11yrs Insurance coverage will need to be confirmed)  10/27/2023   DTaP/Tdap/Td (3 - Td or Tdap) 12/01/2030   INFLUENZA VACCINE  Completed   Hepatitis C Screening  Completed   HIV Screening  Completed   Zoster Vaccines- Shingrix  Completed   HPV VACCINES  Aged Out    ALLERGIES: Eggs or egg-derived products and Glipizide  Current Outpatient Medications on File Prior to Visit  Medication Sig Dispense Refill   cyclobenzaprine (FLEXERIL) 5 MG tablet Take 1 to 2 tablets by mouth at bedtime as needed for muscle  spasms 30 tablet 0   Dulaglutide (TRULICITY) 4.5 PY/1.9JK SOPN Inject 4.5 mg under the skin as directed once a week. 2 mL 3   empagliflozin (JARDIANCE) 25 MG TABS tablet Take 1 tablet (25 mg total) by mouth daily before breakfast. 90 tablet 1   metFORMIN (GLUCOPHAGE-XR) 500 MG 24 hr tablet TAKE 2 TABLETS BY MOUTH 2 TIMES DAILY WITH A MEAL 360 tablet 1   omeprazole (PRILOSEC) 20 MG capsule Take 1 capsule (20 mg total) by mouth daily. 90 capsule 3   atorvastatin (LIPITOR) 20 MG tablet TAKE 1 TABLET BY MOUTH DAILY.  (Patient not taking: Reported on 05/21/2022) 90 tablet 3   No current facility-administered medications on file prior to visit.    Review of Systems  Constitutional:  Negative for chills, fever and unexpected weight change.  HENT:  Negative for congestion.   Respiratory:  Negative for cough.   Cardiovascular:  Negative for chest pain, palpitations and leg swelling.  Gastrointestinal:  Negative for nausea and vomiting.  Genitourinary:  Negative for difficulty urinating, vaginal discharge and vaginal pain.  Musculoskeletal:  Negative for arthralgias and myalgias.  Skin:  Negative for rash.  Neurological:  Negative for headaches.  Hematological:  Negative for adenopathy.  Psychiatric/Behavioral:  Negative for confusion.       Objective:    BP 122/78   Pulse 77   Temp 97.7 F (36.5 C) (Oral)   Ht 5\' 8"  (1.727 m)   Wt 218 lb 3.2 oz (99 kg)   LMP  (LMP Unknown)   SpO2 99%   BMI 33.18 kg/m   BP Readings from Last 3 Encounters:  05/21/22 122/78  02/14/22 118/72  11/29/21 110/62   Wt Readings from Last 3 Encounters:  05/21/22 218 lb 3.2 oz (99 kg)  02/14/22 208 lb 9.6 oz (94.6 kg)  11/29/21 211 lb 12.8 oz (96.1 kg)    Physical Exam Vitals reviewed.  Constitutional:      Appearance: She is well-developed.  Eyes:     Conjunctiva/sclera: Conjunctivae normal.  Neck:     Thyroid: No thyroid mass or thyromegaly.  Cardiovascular:     Rate and Rhythm: Normal rate and regular rhythm.     Pulses: Normal pulses.     Heart sounds: Normal heart sounds.  Pulmonary:     Effort: Pulmonary effort is normal.     Breath sounds: Normal breath sounds. No wheezing, rhonchi or rales.  Chest:  Breasts:    Breasts are symmetrical.     Right: No inverted nipple, mass, nipple discharge, skin change or tenderness.     Left: No inverted nipple, mass, nipple discharge, skin change or tenderness.  Lymphadenopathy:     Head:     Right side of head: No submental, submandibular, tonsillar,  preauricular, posterior auricular or occipital adenopathy.     Left side of head: No submental, submandibular, tonsillar, preauricular, posterior auricular or occipital adenopathy.     Cervical: No cervical adenopathy.     Right cervical: No superficial, deep or posterior cervical adenopathy.    Left cervical: No superficial, deep or posterior cervical adenopathy.  Skin:    General: Skin is warm and dry.  Neurological:     Mental Status: She is alert.  Psychiatric:        Speech: Speech normal.        Behavior: Behavior normal.        Thought Content: Thought content normal.

## 2022-05-21 NOTE — Assessment & Plan Note (Signed)
Deferred pelvic exam as patient is currently following with GYN.  She understands to make that an appointment for Pap smear.  Clinical breast exam performed today.  Call to [facility] clinic to request colonoscopy report.

## 2022-05-21 NOTE — Telephone Encounter (Signed)
Need colonoscopy report Pt unsure where it was done  I dont think [facility] provider as not in Epic  Can you call [facility]?

## 2022-05-22 ENCOUNTER — Telehealth: Payer: Self-pay | Admitting: Family

## 2022-05-22 NOTE — Telephone Encounter (Signed)
Cassandra [name] from [facility] called stating they received a lab order for the pt but they do not know want to test for because it is not on the requisition

## 2022-05-23 NOTE — Telephone Encounter (Signed)
Spoke to pt and she has had colonoscopy and did not remember where she had it but prefers to wait out her 10 yrs before she gets another one

## 2022-05-23 NOTE — Telephone Encounter (Signed)
Spoke to Tamika @ [facility] and gave her correct information for swab

## 2022-05-24 LAB — CERVICOVAGINAL ANCILLARY ONLY
Bacterial Vaginitis (gardnerella): NEGATIVE
Comment: NEGATIVE

## 2022-05-24 NOTE — Telephone Encounter (Signed)
noted 

## 2022-06-06 ENCOUNTER — Other Ambulatory Visit: Payer: Self-pay | Admitting: Family

## 2022-06-06 DIAGNOSIS — E119 Type 2 diabetes mellitus without complications: Secondary | ICD-10-CM

## 2022-06-06 MED FILL — Empagliflozin Tab 25 MG: ORAL | 90 days supply | Qty: 90 | Fill #1 | Status: AC

## 2022-06-07 ENCOUNTER — Other Ambulatory Visit: Payer: Self-pay

## 2022-06-07 MED ORDER — ATORVASTATIN CALCIUM 20 MG PO TABS
ORAL_TABLET | Freq: Every day | ORAL | 3 refills | Status: DC
  Filled 2022-06-07: qty 90, 90d supply, fill #0
  Filled 2022-09-26: qty 90, 90d supply, fill #1
  Filled 2023-01-07: qty 90, 90d supply, fill #2
  Filled 2023-04-06: qty 90, 90d supply, fill #3

## 2022-07-04 ENCOUNTER — Other Ambulatory Visit: Payer: Self-pay | Admitting: Family

## 2022-07-04 ENCOUNTER — Other Ambulatory Visit: Payer: Self-pay

## 2022-07-04 DIAGNOSIS — E119 Type 2 diabetes mellitus without complications: Secondary | ICD-10-CM

## 2022-07-04 MED ORDER — TRULICITY 4.5 MG/0.5ML ~~LOC~~ SOAJ
4.5000 mg | SUBCUTANEOUS | 3 refills | Status: DC
  Filled 2022-07-04: qty 2, 28d supply, fill #0
  Filled 2022-08-01: qty 2, 28d supply, fill #1

## 2022-08-03 NOTE — Patient Instructions (Signed)
Postmenopausal Bleeding Postmenopausal bleeding is any bleeding that occurs after menopause. Menopause is a time in a woman's life when monthly periods stop. Any type of bleeding after menopause should be checked by your doctor. Treatment will depend on the cause. This kind of bleeding can be caused by: Taking hormones during menopause. Low or high amounts of female hormones in the body. This can cause the lining of the womb (uterus) to become too thin or too thick. Cancer. Growths in the womb that are not cancer. Follow these instructions at home:  Watch for any changes in your symptoms. Let your doctor know about them. Avoid using tampons and douches as told by your doctor. Change your pads regularly. Get regular pelvic exams. This includes Pap tests. Take iron pills as told by your doctor. Take over-the-counter and prescription medicines only as told by your doctor. Keep all follow-up visits. Contact a doctor if: You have new bleeding from the vagina after menopause. You have pain in your belly (abdomen). Get help right away if: You have a fever or chills. You have very bad pain with bleeding. You have clumps of blood (blood clots) coming from your vagina. You have a lot of bleeding, and: You use more than 1 pad an hour. This kind of bleeding has never happened before. You have headaches. You feel dizzy or you feel like you are going to pass out (faint). Summary Any type of bleeding after menopause should be checked by your doctor. Avoid using tampons or douches. Get regular pelvic exams. This includes Pap tests. Contact a doctor if you have new bleeding or pain in your belly. Watch for any changes in your symptoms. Let your doctor know about them. This information is not intended to replace advice given to you by your health care provider. Make sure you discuss any questions you have with your health care provider. Document Revised: 09/17/2019 Document Reviewed:  09/17/2019 Elsevier Patient Education  2023 Elsevier Inc.  

## 2022-08-03 NOTE — Progress Notes (Signed)
    GYNECOLOGY PROGRESS NOTE  Subjective:    Patient ID: Cassandra [name], female    DOB: 08-May-1965, 57 y.o.   MRN: [id number]  HPI  Patient is a 57 y.o. [id number] female who presents for irregular period after not having one for 3 years. Period lasted 2 days she was experiencing a lot of vaginal pressure. The bleeding was red to purple in color per her report. She is not sexually active. Due for her annual and a pap smear. She reports a HX of several small fibroids. She is diabetic and also on Lipitor for cholesterol.  The following portions of the patient's history were reviewed and updated as appropriate: allergies, current medications, past family history, past medical history, past social history, past surgical history, and problem list. She is diabetic, has hx of two SVDs. Had one visit in February for vaginal odor and the aptima was neg for BV. She does report urinary stress incontinence.  Review of Systems Pertinent items noted in HPI and remainder of comprehensive ROS otherwise negative.   Objective:   Height  (1.702 m), weight 206 lb (93.4 kg), last menstrual period 07/19/2022. Body mass index is 32.26 kg/m. General appearance: alert, cooperative, and no distress Abdomen: soft, non-tender; bowel sounds normal; no masses,  no organomegaly Pelvic: cervix normal in appearance, external genitalia normal, no adnexal masses or tenderness, no cervical motion tenderness, perianal skin: no external genital warts noted, rectovaginal septum normal, uterus normal size, shape, and consistency, vagina normal without discharge, and She has a cystocele that is readily visible.  Uterus is anteverted.  During bimanual exam, I palpated an irregular , more solid -feeling surface in her lower uterine segment. Extremities: extremities normal, atraumatic, no cyanosis or edema Neurologic: Grossly normal  Her ultrasound today reveals multiple uterine fibroids Endometrial thickness is 1.35  mm.   Assessment:   1. Irregular periods   2. Postmenopausal bleeding   3. Cervical cancer screening   4. Pelvic pressure in female    5. Uterine fibroids (multiple)  Plan:   An attempt was made to complete an endometrial biopsy. She cervix was stenotic, and despite the use of cervical dilators, unsucessful. We will set her up for another EB with Valentino Saxon or Helmut Muster.  I ordered a pelvic ultrasound that was performed in the office. She will f/u with [facility] soon to complete the EB and review the scan. I have ordered her some Cytotec to take the evening before or morning of her EB.   Mirna Mires, CNM  08/09/2022 5:26 PM

## 2022-08-09 ENCOUNTER — Ambulatory Visit: Payer: BC Managed Care – PPO | Admitting: Obstetrics

## 2022-08-09 ENCOUNTER — Other Ambulatory Visit (INDEPENDENT_AMBULATORY_CARE_PROVIDER_SITE_OTHER): Payer: BC Managed Care – PPO

## 2022-08-09 ENCOUNTER — Other Ambulatory Visit: Payer: Self-pay

## 2022-08-09 ENCOUNTER — Encounter: Payer: Self-pay | Admitting: Obstetrics

## 2022-08-09 ENCOUNTER — Other Ambulatory Visit: Payer: Self-pay | Admitting: Obstetrics

## 2022-08-09 VITALS — BP 120/70 | HR 99 | Ht 67.0 in | Wt 206.0 lb

## 2022-08-09 DIAGNOSIS — R102 Pelvic and perineal pain: Secondary | ICD-10-CM

## 2022-08-09 DIAGNOSIS — N939 Abnormal uterine and vaginal bleeding, unspecified: Secondary | ICD-10-CM

## 2022-08-09 DIAGNOSIS — N950 Postmenopausal bleeding: Secondary | ICD-10-CM

## 2022-08-09 DIAGNOSIS — Z124 Encounter for screening for malignant neoplasm of cervix: Secondary | ICD-10-CM

## 2022-08-09 DIAGNOSIS — N926 Irregular menstruation, unspecified: Secondary | ICD-10-CM

## 2022-08-09 MED ORDER — MISOPROSTOL 200 MCG PO TABS
200.0000 ug | ORAL_TABLET | Freq: Once | ORAL | 0 refills | Status: DC
Start: 2022-08-09 — End: 2023-05-23
  Filled 2022-08-09: qty 1, 1d supply, fill #0

## 2022-08-20 ENCOUNTER — Encounter: Payer: Self-pay | Admitting: Family

## 2022-08-20 ENCOUNTER — Other Ambulatory Visit: Payer: Self-pay

## 2022-08-20 ENCOUNTER — Ambulatory Visit: Payer: BC Managed Care – PPO | Admitting: Family

## 2022-08-20 VITALS — BP 128/80 | HR 76 | Temp 97.9°F | Ht 67.0 in | Wt 212.0 lb

## 2022-08-20 DIAGNOSIS — Z7985 Long-term (current) use of injectable non-insulin antidiabetic drugs: Secondary | ICD-10-CM | POA: Diagnosis not present

## 2022-08-20 DIAGNOSIS — R7309 Other abnormal glucose: Secondary | ICD-10-CM

## 2022-08-20 DIAGNOSIS — Z7984 Long term (current) use of oral hypoglycemic drugs: Secondary | ICD-10-CM | POA: Diagnosis not present

## 2022-08-20 DIAGNOSIS — E119 Type 2 diabetes mellitus without complications: Secondary | ICD-10-CM

## 2022-08-20 DIAGNOSIS — I10 Essential (primary) hypertension: Secondary | ICD-10-CM | POA: Diagnosis not present

## 2022-08-20 LAB — POCT GLYCOSYLATED HEMOGLOBIN (HGB A1C): Hemoglobin A1C: 7.6 % — AB (ref 4.0–5.6)

## 2022-08-20 MED ORDER — TRULICITY 4.5 MG/0.5ML ~~LOC~~ SOAJ
4.5000 mg | SUBCUTANEOUS | 3 refills | Status: DC
Start: 2022-08-20 — End: 2023-01-07
  Filled 2022-08-20 – 2022-08-27 (×2): qty 2, 28d supply, fill #0
  Filled 2022-10-24 – 2022-10-25 (×2): qty 2, 28d supply, fill #1
  Filled 2022-11-22: qty 2, 28d supply, fill #2
  Filled 2022-12-20: qty 2, 28d supply, fill #3

## 2022-08-20 NOTE — Assessment & Plan Note (Signed)
Excellent control.  Dizziness has resolved with suspension of lisinopril.  No history of microalbuminuria however we will continue to monitor. 

## 2022-08-20 NOTE — Progress Notes (Signed)
Assessment & Plan:  Elevated glucose -     POCT glycosylated hemoglobin (Hb A1C)  Controlled type 2 diabetes mellitus without complication, without long-term current use of insulin (HCC) Assessment & Plan: Lab Results  Component Value Date   HGBA1C 7.6 (A) 08/20/2022   Improved from prior.  Patient prefers to continue metformin 1500 mg daily, Jardiance 25 mg daily, Trulicity 4.5 mg once weekly.  Counseled on low glycemic diet.  Close follow-up  Orders: -     Trulicity; Inject 4.5 mg under the skin as directed once a week.  Dispense: 2 mL; Refill: 3  Benign essential HTN Assessment & Plan: Excellent control.  Dizziness has resolved with suspension of lisinopril.  No history of microalbuminuria however we will continue to monitor.      Return precautions given.   Risks, benefits, and alternatives of the medications and treatment plan prescribed today were discussed, and patient expressed understanding.   Education regarding symptom management and diagnosis given to patient on AVS either electronically or printed.  Return in about 3 months (around 11/20/2022).  Rennie Plowman, FNP  Subjective:    Patient ID: Cassandra [name], female    DOB: Jun 17, 1965, 57 y.o.   MRN: [id number]  CC: Cassandra [name] is a 57 y.o. female who presents today for follow up.   HPI: Feels well today.  No new complaints.  She is limiting sugary drinks.  She endorses indiscretion with rice, potatoes   Compliant with metformin 1500 mg daily , Jardiance 25 mg daily, Trulicity 4.5 mg  she is following with [facility] for endometrial biopsy in the setting of postmenopausal bleeding. Allergies: Egg-derived products and Glipizide Current Outpatient Medications on File Prior to Visit  Medication Sig Dispense Refill   atorvastatin (LIPITOR) 20 MG tablet TAKE 1 TABLET BY MOUTH DAILY. 90 tablet 3   cyclobenzaprine (FLEXERIL) 5 MG tablet Take 1 to 2 tablets by mouth at bedtime as needed for muscle spasms  30 tablet 0   empagliflozin (JARDIANCE) 25 MG TABS tablet Take 1 tablet (25 mg total) by mouth daily before breakfast. 90 tablet 1   metFORMIN (GLUCOPHAGE-XR) 500 MG 24 hr tablet TAKE 2 TABLETS BY MOUTH 2 TIMES DAILY WITH A MEAL 360 tablet 1   omeprazole (PRILOSEC) 20 MG capsule Take 1 capsule (20 mg total) by mouth daily. 90 capsule 3   misoprostol (CYTOTEC) 200 MCG tablet Place 1 tablet (200 mcg total) vaginally once for 1 dose. At bedtime evening prior to procedure 1 tablet 0   No current facility-administered medications on file prior to visit.    Review of Systems  Constitutional:  Negative for chills and fever.  Respiratory:  Negative for cough.   Cardiovascular:  Negative for chest pain and palpitations.  Gastrointestinal:  Negative for nausea and vomiting.      Objective:    LMP 07/19/2022  BP Readings from Last 3 Encounters:  08/09/22 120/70  05/21/22 122/78  02/14/22 118/72   Wt Readings from Last 3 Encounters:  08/09/22 206 lb (93.4 kg)  05/21/22 218 lb 3.2 oz (99 kg)  02/14/22 208 lb 9.6 oz (94.6 kg)    Physical Exam Vitals reviewed.  Constitutional:      Appearance: She is well-developed.  Eyes:     Conjunctiva/sclera: Conjunctivae normal.  Cardiovascular:     Rate and Rhythm: Normal rate and regular rhythm.     Pulses: Normal pulses.     Heart sounds: Normal heart sounds.  Pulmonary:  Effort: Pulmonary effort is normal.     Breath sounds: Normal breath sounds. No wheezing, rhonchi or rales.  Skin:    General: Skin is warm and dry.  Neurological:     Mental Status: She is alert.  Psychiatric:        Speech: Speech normal.        Behavior: Behavior normal.        Thought Content: Thought content normal.

## 2022-08-20 NOTE — Assessment & Plan Note (Signed)
Lab Results  Component Value Date   HGBA1C 7.6 (A) 08/20/2022   Improved from prior.  Patient prefers to continue metformin 1500 mg daily, Jardiance 25 mg daily, Trulicity 4.5 mg once weekly.  Counseled on low glycemic diet.  Close follow-up

## 2022-08-21 NOTE — Patient Instructions (Signed)
Nice to see you!   

## 2022-08-24 ENCOUNTER — Other Ambulatory Visit: Payer: Self-pay

## 2022-08-27 ENCOUNTER — Other Ambulatory Visit: Payer: Self-pay

## 2022-08-30 ENCOUNTER — Other Ambulatory Visit: Payer: Self-pay

## 2022-09-04 NOTE — Progress Notes (Deleted)
Endometrial Biopsy Procedure Note  The patient is positioned on the exam table in the dorsal lithotomy position. Bimanual exam confirms uterine position and size. A Graves speculum is placed into the vagina. A single toothed tenaculum is placed onto the anterior lip of the cervix. The pipette is placed into the endocervical canal and is advanced to the uterine fundus. Using a piston like technique, with vacuum created by withdrawing the stylus, the endometrial specimen is obtained and transferred to the biopsy container. Minimal bleeding is encountered. The procedure is well tolerated.   Uterine Position:mid anterior posterior   Uterine Length: 6 7 8 9 10  cm   Uterine Specimen: Scant Average Lush  Post procedure instructions are given. The patient is scheduled for follow up appointment.

## 2022-09-05 ENCOUNTER — Ambulatory Visit: Payer: BC Managed Care – PPO | Admitting: Obstetrics and Gynecology

## 2022-09-12 ENCOUNTER — Other Ambulatory Visit: Payer: Self-pay

## 2022-09-20 ENCOUNTER — Telehealth: Payer: Self-pay | Admitting: Family

## 2022-09-20 NOTE — Telephone Encounter (Signed)
Pt called stating her trulicity is out of stock at her pharmacy so she want to see If she can get something similar

## 2022-09-20 NOTE — Telephone Encounter (Signed)
LVM to call back to office  

## 2022-09-20 NOTE — Telephone Encounter (Signed)
Spoke to pt and explained to her that Cassandra [name] was out of the office until Mon. But I would send message to her.

## 2022-09-20 NOTE — Telephone Encounter (Signed)
Pt returned [facility] 2 CMA call. Pt said to call her back [phone].

## 2022-09-26 ENCOUNTER — Other Ambulatory Visit: Payer: Self-pay | Admitting: Family

## 2022-09-26 ENCOUNTER — Other Ambulatory Visit: Payer: Self-pay

## 2022-09-26 DIAGNOSIS — E119 Type 2 diabetes mellitus without complications: Secondary | ICD-10-CM

## 2022-09-26 MED ORDER — EMPAGLIFLOZIN 25 MG PO TABS
25.0000 mg | ORAL_TABLET | Freq: Every day | ORAL | 1 refills | Status: DC
Start: 2022-09-26 — End: 2023-04-08
  Filled 2022-09-26: qty 90, 90d supply, fill #0
  Filled 2023-01-07: qty 90, 90d supply, fill #1

## 2022-09-27 ENCOUNTER — Other Ambulatory Visit: Payer: Self-pay

## 2022-10-25 ENCOUNTER — Other Ambulatory Visit: Payer: Self-pay

## 2022-11-21 ENCOUNTER — Ambulatory Visit: Payer: BC Managed Care – PPO | Admitting: Family

## 2022-11-22 ENCOUNTER — Other Ambulatory Visit: Payer: Self-pay

## 2022-12-13 ENCOUNTER — Ambulatory Visit: Payer: BC Managed Care – PPO | Admitting: Family

## 2022-12-13 ENCOUNTER — Encounter: Payer: Self-pay | Admitting: Family

## 2022-12-13 VITALS — BP 126/78 | HR 91 | Temp 97.7°F | Ht 67.0 in | Wt 209.6 lb

## 2022-12-13 DIAGNOSIS — Z1231 Encounter for screening mammogram for malignant neoplasm of breast: Secondary | ICD-10-CM

## 2022-12-13 DIAGNOSIS — E119 Type 2 diabetes mellitus without complications: Secondary | ICD-10-CM | POA: Diagnosis not present

## 2022-12-13 DIAGNOSIS — R5383 Other fatigue: Secondary | ICD-10-CM

## 2022-12-13 DIAGNOSIS — Z7985 Long-term (current) use of injectable non-insulin antidiabetic drugs: Secondary | ICD-10-CM

## 2022-12-13 DIAGNOSIS — M542 Cervicalgia: Secondary | ICD-10-CM

## 2022-12-13 LAB — POCT GLYCOSYLATED HEMOGLOBIN (HGB A1C): Hemoglobin A1C: 8.4 % — AB (ref 4.0–5.6)

## 2022-12-13 NOTE — Patient Instructions (Addendum)
Trial of tylenol arthritis 650mg  tablet.  You may schedule this today  Trial of cymbalta for chronic pain  Increase metformin to 1500mg  daily  Continue trulicity and let me know if you ever run out please !! :)   ,

## 2022-12-13 NOTE — Assessment & Plan Note (Signed)
Chronic, suboptimal control.  Trial Cymbalta 30 mg daily and titrate

## 2022-12-13 NOTE — Assessment & Plan Note (Signed)
Suspect multifactorial due to third shift over the summer.  She is also doing classes in the evening after work.  Pending lab to evaluate for metabolic etiology.  Discussed with patient concern for sleep apnea as sleep is not restorative.  We discussed long-term risks with untreated sleep apnea.  Patient politely declines evaluation for sleep apnea at this time.  Will continue to discuss

## 2022-12-13 NOTE — Assessment & Plan Note (Signed)
Lab Results  Component Value Date   HGBA1C 8.4 (A) 12/13/2022   Uncontrolled patient was off of Trulicity for 6 weeks during the last 3 months.  Previously A1c had improved.  We jointly agreed to continue Trulicity 4.5 mg.  Advised patient to increase metformin from 1000 mg daily to 1500 mg daily.

## 2022-12-13 NOTE — Progress Notes (Signed)
Assessment & Plan:  Controlled type 2 diabetes mellitus without complication, without long-term current use of insulin (HCC) Assessment & Plan: Lab Results  Component Value Date   HGBA1C 8.4 (A) 12/13/2022   Uncontrolled patient was off of Trulicity for 6 weeks during the last 3 months.  Previously A1c had improved.  We jointly agreed to continue Trulicity 4.5 mg.  Advised patient to increase metformin from 1000 mg daily to 1500 mg daily.  Orders: -     POCT glycosylated hemoglobin (Hb A1C) -     Comprehensive metabolic panel; Future -     Microalbumin / creatinine urine ratio; Future  Other fatigue Assessment & Plan: Suspect multifactorial due to third shift over the summer.  She is also doing classes in the evening after work.  Pending lab to evaluate for metabolic etiology.  Discussed with patient concern for sleep apnea as sleep is not restorative.  We discussed long-term risks with untreated sleep apnea.  Patient politely declines evaluation for sleep apnea at this time.  Will continue to discuss  Orders: -     3D Screening Mammogram, Left and Right; Future -     CBC with Differential/Platelet; Future -     Comprehensive metabolic panel; Future -     TSH; Future -     VITAMIN D 25 Hydroxy (Vit-D Deficiency, Fractures); Future -     B12 and Folate Panel; Future -     Lipid panel; Future  Encounter for screening mammogram for malignant neoplasm of breast -     3D Screening Mammogram, Left and Right; Future  Neck pain Assessment & Plan: Chronic, suboptimal control.  Trial Cymbalta 30 mg daily and titrate      Return precautions given.   Risks, benefits, and alternatives of the medications and treatment plan prescribed today were discussed, and patient expressed understanding.   Education regarding symptom management and diagnosis given to patient on AVS either electronically or printed.  Return for Complete Physical Exam, Fasting labs in 2-3 weeks.  Cassandra Plowman,  FNP  Subjective:    Patient ID: Cassandra [name], female    DOB: 08-02-1965, 57 y.o.   MRN: [id number]  CC: Cassandra [name] is a 57 y.o. female who presents today for follow up.   HPI: Complains of continued neck and upper back pain.  Flexeril has not been particularly helpful.  She is no longer taking Aleve .  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  Sleep is not restorative.  She does not believe that she snores  She is also doing online classes at night   She was not on trulicity for 6 weeks as backordered. Compliant with metformin with 1000mg  every day,   Allergies: Egg-derived products and Glipizide Current Outpatient Medications on File Prior to Visit  Medication Sig Dispense Refill   atorvastatin (LIPITOR) 20 MG tablet TAKE 1 TABLET BY MOUTH DAILY. 90 tablet 3   cyclobenzaprine (FLEXERIL) 5 MG tablet Take 1 to 2 tablets by mouth at bedtime as needed for muscle spasms 30 tablet 0   Dulaglutide (TRULICITY) 4.5 MG/0.5ML SOPN Inject 4.5 mg under the skin as directed once a week. 2 mL 3   empagliflozin (JARDIANCE) 25 MG TABS tablet Take 1 tablet (25 mg total) by mouth daily before breakfast. 90 tablet 1   omeprazole (PRILOSEC) 20 MG capsule Take 1 capsule (20 mg total) by mouth  daily. 90 capsule 3   metFORMIN (GLUCOPHAGE-XR) 500 MG 24 hr tablet TAKE 2 TABLETS BY MOUTH 2 TIMES DAILY WITH A MEAL 360 tablet 1   misoprostol (CYTOTEC) 200 MCG tablet Place 1 tablet (200 mcg total) vaginally once for 1 dose. At bedtime evening prior to procedure 1 tablet 0   No current facility-administered medications on file prior to visit.    Review of Systems  Constitutional:  Positive for fatigue. Negative for chills and fever.  Respiratory:  Negative for cough.   Cardiovascular:  Negative for chest pain and palpitations.  Gastrointestinal:  Negative for nausea and vomiting.   Musculoskeletal:  Positive for back pain and neck pain.  Neurological:  Negative for numbness.      Objective:    BP 126/78   Pulse 91   Temp 97.7 F (36.5 C) (Oral)   Ht 5\' 7"  (1.702 m)   Wt 209 lb 9.6 oz (95.1 kg)   LMP  (LMP Unknown)   SpO2 99%   BMI 32.83 kg/m  BP Readings from Last 3 Encounters:  12/13/22 126/78  08/20/22 128/80  08/09/22 120/70   Wt Readings from Last 3 Encounters:  12/13/22 209 lb 9.6 oz (95.1 kg)  08/20/22 212 lb (96.2 kg)  08/09/22 206 lb (93.4 kg)    Physical Exam Vitals reviewed.  Constitutional:      Appearance: She is well-developed.  Eyes:     Conjunctiva/sclera: Conjunctivae normal.  Cardiovascular:     Rate and Rhythm: Normal rate and regular rhythm.     Pulses: Normal pulses.     Heart sounds: Normal heart sounds.  Pulmonary:     Effort: Pulmonary effort is normal.     Breath sounds: Normal breath sounds. No wheezing, rhonchi or rales.  Skin:    General: Skin is warm and dry.  Neurological:     Mental Status: She is alert.  Psychiatric:        Speech: Speech normal.        Behavior: Behavior normal.        Thought Content: Thought content normal.

## 2022-12-13 NOTE — Progress Notes (Signed)
po

## 2022-12-20 ENCOUNTER — Other Ambulatory Visit: Payer: Self-pay

## 2023-01-01 ENCOUNTER — Other Ambulatory Visit: Payer: BC Managed Care – PPO

## 2023-01-01 ENCOUNTER — Other Ambulatory Visit (INDEPENDENT_AMBULATORY_CARE_PROVIDER_SITE_OTHER): Payer: BC Managed Care – PPO

## 2023-01-01 DIAGNOSIS — R5383 Other fatigue: Secondary | ICD-10-CM | POA: Diagnosis not present

## 2023-01-01 DIAGNOSIS — E119 Type 2 diabetes mellitus without complications: Secondary | ICD-10-CM

## 2023-01-07 ENCOUNTER — Other Ambulatory Visit: Payer: Self-pay | Admitting: Family

## 2023-01-07 DIAGNOSIS — E119 Type 2 diabetes mellitus without complications: Secondary | ICD-10-CM

## 2023-01-07 DIAGNOSIS — R197 Diarrhea, unspecified: Secondary | ICD-10-CM

## 2023-01-08 ENCOUNTER — Other Ambulatory Visit: Payer: Self-pay

## 2023-01-08 MED ORDER — TRULICITY 4.5 MG/0.5ML ~~LOC~~ SOAJ
4.5000 mg | SUBCUTANEOUS | 3 refills | Status: DC
  Filled 2023-01-08 – 2023-01-14 (×2): qty 2, 28d supply, fill #0
  Filled 2023-02-14: qty 2, 28d supply, fill #1
  Filled 2023-03-11: qty 2, 28d supply, fill #2
  Filled 2023-04-06: qty 2, 28d supply, fill #3

## 2023-01-08 MED ORDER — METFORMIN HCL ER 500 MG PO TB24
1000.0000 mg | ORAL_TABLET | Freq: Two times a day (BID) | ORAL | 1 refills | Status: DC
  Filled 2023-01-08: qty 360, 90d supply, fill #0

## 2023-01-10 ENCOUNTER — Encounter: Payer: Self-pay | Admitting: *Deleted

## 2023-01-14 ENCOUNTER — Other Ambulatory Visit: Payer: Self-pay

## 2023-03-11 ENCOUNTER — Other Ambulatory Visit: Payer: Self-pay

## 2023-04-06 ENCOUNTER — Other Ambulatory Visit: Payer: Self-pay | Admitting: Family

## 2023-04-06 DIAGNOSIS — E119 Type 2 diabetes mellitus without complications: Secondary | ICD-10-CM

## 2023-04-08 ENCOUNTER — Other Ambulatory Visit: Payer: Self-pay | Admitting: Family

## 2023-04-08 ENCOUNTER — Other Ambulatory Visit: Payer: Self-pay

## 2023-04-08 DIAGNOSIS — E119 Type 2 diabetes mellitus without complications: Secondary | ICD-10-CM

## 2023-04-08 MED FILL — Empagliflozin Tab 25 MG: ORAL | 90 days supply | Qty: 90 | Fill #0 | Status: AC

## 2023-05-06 ENCOUNTER — Ambulatory Visit
Admission: RE | Admit: 2023-05-06 | Discharge: 2023-05-06 | Disposition: A | Discharge status: Home / Self Care | Payer: 59 | Source: Ambulatory Visit | Attending: Family | Admitting: Family

## 2023-05-06 DIAGNOSIS — Z1231 Encounter for screening mammogram for malignant neoplasm of breast: Secondary | ICD-10-CM | POA: Diagnosis present

## 2023-05-06 DIAGNOSIS — R5383 Other fatigue: Secondary | ICD-10-CM

## 2023-05-09 ENCOUNTER — Other Ambulatory Visit: Payer: Self-pay

## 2023-05-09 ENCOUNTER — Other Ambulatory Visit: Payer: Self-pay | Admitting: Family

## 2023-05-09 DIAGNOSIS — E119 Type 2 diabetes mellitus without complications: Secondary | ICD-10-CM

## 2023-05-09 MED ORDER — TRULICITY 4.5 MG/0.5ML ~~LOC~~ SOAJ
4.5000 mg | SUBCUTANEOUS | 3 refills | Status: DC
  Filled 2023-05-09: qty 2, 28d supply, fill #0
  Filled 2023-06-05: qty 2, 28d supply, fill #1
  Filled 2023-07-05: qty 2, 28d supply, fill #2
  Filled 2023-08-02: qty 2, 28d supply, fill #3

## 2023-05-21 NOTE — Progress Notes (Signed)
 PCP: Dineen Rollene MATSU, FNP   Chief Complaint  Patient presents with   Gynecologic Exam    Pelvic pain like on a bone when stands up x 2 months    HPI:      Cassandra [name] is a 58 y.o. [id number] whose LMP was No LMP recorded. Patient is perimenopausal., presents today for her  annual examination.  Her menses are absent since 11/21. No PMB. She has occas tolerable vasomotor sx. Had 2 days PMB with pelvic pressure 4/24 and neg Gyn u/s except multiple leio (EM=1.35 mm); EMB unsuccessfully attempted by Rollene Ro; no PMB since.   Has noticed RLQ pain past couple of months; sx occur when standing from sitting position or with walking. Sx intermittent and not every time. Pain is sharp. No GI, urin sx with pain. No meds taken for sx. Hx of degenerative changes on cx and thoracic spine on 8/23 xray. No known hip arthritis.   Has issues with SUI and urge incontinence. States she could wear a full diaper. Drinks lots of caffeine. Hasn't tried PT.   Sex activity: not sexually active. She does not have vaginal dryness.  Last Pap: 06/03/17 Results were: no abnormalities /neg HPV DNA.  No hx of abn paps with bx/tx.   Last mammogram: 05/06/23  Results were: normal--routine follow-up in 12 months There is a FH of breast cancer in her sister, genetic testing not indicated for pt. There is no FH of ovarian cancer. The patient does do self-breast exams.  Colonoscopy: age 46  Repeat due after 10 years per pt.   Tobacco use: The patient denies current or previous tobacco use. Alcohol use: none No drug use Exercise: not active  She does get adequate calcium  and Vitamin D  in her diet.  Labs with PCP.  Patient Active Problem List   Diagnosis Date Noted   Fatigue 12/13/2022   Postmenopausal bleeding 08/09/2022   Neck pain 11/29/2021   Right ear impacted cerumen 04/17/2020   Right foot pain 01/12/2020   Hyperlipidemia 05/05/2018   Suprapubic pain 12/15/2015   GERD (gastroesophageal  reflux disease) 11/01/2015   Diarrhea 05/19/2015   Routine physical examination 02/20/2015   Diabetes mellitus type 2, controlled, without complications (HCC) 02/20/2015   Benign essential HTN 02/20/2015   Herpes simplex 02/20/2015    Past Surgical History:  Procedure Laterality Date   CHOLECYSTECTOMY  2015   HERNIA REPAIR     58 years old   TUBAL LIGATION  1991    Family History  Problem Relation Age of Onset   Hypertension Mother    Diabetes Mother    Breast cancer Sister 27   Thyroid  cancer Neg Hx     Social History   Socioeconomic History   Marital status: Single    Spouse name: Not on file   Number of children: Not on file   Years of education: Not on file   Highest education level: Not on file  Occupational History   Not on file  Tobacco Use   Smoking status: Never   Smokeless tobacco: Never  Vaping Use   Vaping status: Never Used  Substance and Sexual Activity   Alcohol use: No    Alcohol/week: 0.0 standard drinks of alcohol   Drug use: No   Sexual activity: Not Currently    Birth control/protection: None  Other Topics Concern   Not on file  Social History Narrative   2 children (Sons)    [employer]chant [employer] Officer  at [employer]   PRN at cook at [employer]       GED   Caffeine- Pepsi- x3 20 oz, no tea, coffee when cold- 1 cup   Exercise- wants to start a regimen.       Sister passed away from breast cancer 01/2021   Social Drivers of Health   Financial Resource Strain: Not on file  Food Insecurity: Not on file  Transportation Needs: Not on file  Physical Activity: Insufficiently Active (06/03/2017)   Exercise Vital Sign    Days of Exercise per Week: 1 day    Minutes of Exercise per Session: 50 min  Stress: Stress Concern Present (06/03/2017)   [employer] of Occupational Health - Occupational Stress Questionnaire    Feeling of Stress : To some extent  Social Connections: Unknown (06/03/2017)   Social Connection and Isolation Panel  [NHANES]    Frequency of Communication with Friends and Family: More than three times a week    Frequency of Social Gatherings with Friends and Family: Three times a week    Attends Religious Services: More than 4 times per year    Active Member of Clubs or Organizations: Yes    Attends Banker Meetings: More than 4 times per year    Marital Status: Not on file  Intimate Partner Violence: Not At Risk (06/03/2017)   Humiliation, Afraid, Rape, and Kick questionnaire    Fear of Current or Ex-Partner: No    Emotionally Abused: No    Physically Abused: No    Sexually Abused: No     Current Outpatient Medications:    atorvastatin  (LIPITOR) 20 MG tablet, TAKE 1 TABLET BY MOUTH DAILY., Disp: 90 tablet, Rfl: 3   cyclobenzaprine  (FLEXERIL ) 5 MG tablet, Take 1 to 2 tablets by mouth at bedtime as needed for muscle spasms, Disp: 30 tablet, Rfl: 0   Dulaglutide  (TRULICITY ) 4.5 MG/0.5ML SOAJ, Inject 4.5 mg under the skin as directed once a week., Disp: 2 mL, Rfl: 3   empagliflozin  (JARDIANCE ) 25 MG TABS tablet, Take 1 tablet (25 mg total) by mouth daily before breakfast., Disp: 90 tablet, Rfl: 1   metFORMIN  (GLUCOPHAGE -XR) 500 MG 24 hr tablet, Take 2 tablets (1,000 mg total) by mouth 2 (two) times daily with a meal., Disp: 360 tablet, Rfl: 1   omeprazole  (PRILOSEC) 20 MG capsule, Take 1 capsule (20 mg total) by mouth daily., Disp: 90 capsule, Rfl: 3     ROS:  Review of Systems  Constitutional:  Positive for fatigue. Negative for fever and unexpected weight change.  Respiratory:  Negative for cough, shortness of breath and wheezing.   Cardiovascular:  Negative for chest pain, palpitations and leg swelling.  Gastrointestinal:  Negative for blood in stool, constipation, diarrhea, nausea and vomiting.  Endocrine: Negative for cold intolerance, heat intolerance and polyuria.  Genitourinary:  Positive for pelvic pain. Negative for dyspareunia, dysuria, flank pain, frequency, genital sores,  hematuria, menstrual problem, urgency, vaginal bleeding, vaginal discharge and vaginal pain.  Musculoskeletal:  Positive for arthralgias. Negative for back pain, joint swelling and myalgias.  Skin:  Negative for rash.  Neurological:  Negative for dizziness, syncope, light-headedness, numbness and headaches.  Hematological:  Negative for adenopathy.  Psychiatric/Behavioral:  Negative for agitation, confusion, sleep disturbance and suicidal ideas. The patient is not nervous/anxious.    BREAST: No symptoms    Objective: BP 116/70   Pulse 98   Ht 5' 7 (1.702 m)   Wt 211 lb (95.7 kg)   BMI  33.05 kg/m    Physical Exam Constitutional:      Appearance: She is well-developed.  Genitourinary:     Vulva normal.     Right Labia: No rash, tenderness or lesions.    Left Labia: No tenderness, lesions or rash.    No vaginal discharge, erythema or tenderness.     Anterior vaginal prolapse present.     Right Adnexa: not tender and no mass present.    Left Adnexa: not tender and no mass present.    No cervical friability or polyp.     Uterus is not enlarged or tender.  Breasts:    Right: No mass, nipple discharge, skin change or tenderness.     Left: No mass, nipple discharge, skin change or tenderness.  Neck:     Thyroid : No thyromegaly.  Cardiovascular:     Rate and Rhythm: Normal rate and regular rhythm.     Heart sounds: Normal heart sounds. No murmur heard. Pulmonary:     Effort: Pulmonary effort is normal.     Breath sounds: Normal breath sounds.  Abdominal:     Palpations: Abdomen is soft.     Tenderness: There is no abdominal tenderness. There is no guarding or rebound.    Musculoskeletal:        General: Normal range of motion.     Cervical back: Normal range of motion.  Lymphadenopathy:     Cervical: No cervical adenopathy.  Neurological:     General: No focal deficit present.     Mental Status: She is alert and oriented to person, place, and time.     Cranial  Nerves: No cranial nerve deficit.  Skin:    General: Skin is warm and dry.  Psychiatric:        Mood and Affect: Mood normal.        Behavior: Behavior normal.        Thought Content: Thought content normal.        Judgment: Judgment normal.  Vitals reviewed.     Assessment/Plan:  Encounter for annual routine gynecological examination  Cervical cancer screening--forgot to collect specimen today. Called pt and asked her to come back for pap.   Screening for HPV (human papillomavirus)  Encounter for screening mammogram for malignant neoplasm of breast; pt current on mammo  RLQ abdominal pain - Plan: US  PELVIC COMPLETE WITH TRANSVAGINAL, Ambulatory referral to Physical Therapy; neg exam, sx with movement. Check GYN u/s. If neg, most likely [facility]K. Referred to pelvic PT for urinary sx anyway.   Pelvic relaxation - Plan: Ambulatory referral to Physical Therapy  Mixed incontinence urge and stress - Plan: Ambulatory referral to Physical Therapy; cystocele on exam, d/c caffeine for OAB/urge sx. Refer to pelvic PT.   Baden-Walker grade 1 cystocele - Plan: Ambulatory referral to Physical Therapy           GYN counsel breast self exam, mammography screening, menopause, adequate intake of calcium  and vitamin D , diet and exercise    F/U  Return in about 1 year (around 05/22/2024).  Katlin Bortner B. Shayla Heming, PA-C 05/23/2023 3:59 PM

## 2023-05-23 ENCOUNTER — Ambulatory Visit: Payer: Self-pay | Admitting: Obstetrics and Gynecology

## 2023-05-23 ENCOUNTER — Encounter: Payer: Self-pay | Admitting: Obstetrics and Gynecology

## 2023-05-23 VITALS — BP 116/70 | HR 98 | Ht 67.0 in | Wt 211.0 lb

## 2023-05-23 DIAGNOSIS — N950 Postmenopausal bleeding: Secondary | ICD-10-CM

## 2023-05-23 DIAGNOSIS — Z01419 Encounter for gynecological examination (general) (routine) without abnormal findings: Secondary | ICD-10-CM

## 2023-05-23 DIAGNOSIS — Z1151 Encounter for screening for human papillomavirus (HPV): Secondary | ICD-10-CM

## 2023-05-23 DIAGNOSIS — N8189 Other female genital prolapse: Secondary | ICD-10-CM

## 2023-05-23 DIAGNOSIS — Z1231 Encounter for screening mammogram for malignant neoplasm of breast: Secondary | ICD-10-CM

## 2023-05-23 DIAGNOSIS — N8110 Cystocele, unspecified: Secondary | ICD-10-CM

## 2023-05-23 DIAGNOSIS — R1031 Right lower quadrant pain: Secondary | ICD-10-CM

## 2023-05-23 DIAGNOSIS — Z124 Encounter for screening for malignant neoplasm of cervix: Secondary | ICD-10-CM

## 2023-05-23 DIAGNOSIS — N3946 Mixed incontinence: Secondary | ICD-10-CM

## 2023-05-23 NOTE — Patient Instructions (Signed)
 I value your feedback and you entrusting [place] with your care. If you get a [place] patient survey, I would appreciate you taking the time to let us know about your experience today. Thank you! ? ? ?

## 2023-05-30 ENCOUNTER — Telehealth: Payer: Self-pay | Admitting: Family

## 2023-05-30 ENCOUNTER — Ambulatory Visit (INDEPENDENT_AMBULATORY_CARE_PROVIDER_SITE_OTHER): Payer: 59 | Admitting: Family

## 2023-05-30 ENCOUNTER — Other Ambulatory Visit: Payer: Self-pay

## 2023-05-30 ENCOUNTER — Other Ambulatory Visit: Payer: Self-pay | Admitting: Family

## 2023-05-30 ENCOUNTER — Encounter: Payer: Self-pay | Admitting: Family

## 2023-05-30 VITALS — BP 138/80 | HR 75 | Temp 98.1°F | Ht 67.0 in | Wt 208.0 lb

## 2023-05-30 DIAGNOSIS — Z0000 Encounter for general adult medical examination without abnormal findings: Secondary | ICD-10-CM

## 2023-05-30 DIAGNOSIS — E119 Type 2 diabetes mellitus without complications: Secondary | ICD-10-CM

## 2023-05-30 DIAGNOSIS — Z7984 Long term (current) use of oral hypoglycemic drugs: Secondary | ICD-10-CM

## 2023-05-30 DIAGNOSIS — E785 Hyperlipidemia, unspecified: Secondary | ICD-10-CM

## 2023-05-30 DIAGNOSIS — Z1211 Encounter for screening for malignant neoplasm of colon: Secondary | ICD-10-CM

## 2023-05-30 DIAGNOSIS — R197 Diarrhea, unspecified: Secondary | ICD-10-CM | POA: Diagnosis not present

## 2023-05-30 DIAGNOSIS — I10 Essential (primary) hypertension: Secondary | ICD-10-CM

## 2023-05-30 DIAGNOSIS — K219 Gastro-esophageal reflux disease without esophagitis: Secondary | ICD-10-CM

## 2023-05-30 MED ORDER — EMPAGLIFLOZIN 25 MG PO TABS
25.0000 mg | ORAL_TABLET | Freq: Every day | ORAL | 1 refills | Status: DC
Start: 2023-05-30 — End: 2024-01-27
  Filled 2023-05-30 – 2023-07-19 (×2): qty 90, 90d supply, fill #0
  Filled 2023-10-28: qty 90, 90d supply, fill #1

## 2023-05-30 MED ORDER — METFORMIN HCL ER 500 MG PO TB24: 1000.0000 mg | ORAL_TABLET | Freq: Every day | ORAL | Status: DC

## 2023-05-30 MED ORDER — ATORVASTATIN CALCIUM 20 MG PO TABS
20.0000 mg | ORAL_TABLET | Freq: Every day | ORAL | 3 refills | Status: AC
  Filled 2023-05-30 – 2023-07-19 (×2): qty 90, 90d supply, fill #0
  Filled 2023-10-28: qty 90, 90d supply, fill #1
  Filled 2024-01-24: qty 90, 90d supply, fill #2
  Filled 2024-05-07: qty 90, 90d supply, fill #3

## 2023-05-30 MED ORDER — OMEPRAZOLE 20 MG PO CPDR
20.0000 mg | DELAYED_RELEASE_CAPSULE | Freq: Every day | ORAL | 3 refills | Status: AC
  Filled 2023-05-30: qty 90, 90d supply, fill #0

## 2023-05-30 NOTE — Assessment & Plan Note (Signed)
Deferred clinical breast exam and Pap smear due to patient preference.  She will return to GYN for Pap smear.  Discussed colonoscopy versus Cologuard.  In the absence of family history or changes to bowel habits, reasonable proceed with Cologuard which I have ordered today.  Requesting results of colonoscopy for clarity.  Reported return in 10 years.Marland Kitchen

## 2023-05-30 NOTE — Patient Instructions (Signed)
I have ordered cologuard  Please let me know if you do not receive in 2 weeks  Health Maintenance for Postmenopausal Women Menopause is a normal process in which your ability to get pregnant comes to an end. This process happens slowly over many months or years, usually between the ages of 68 and 70. Menopause is complete when you have missed your menstrual period for 12 months. It is important to talk with your health care provider about some of the most common conditions that affect women after menopause (postmenopausal women). These include heart disease, cancer, and bone loss (osteoporosis). Adopting a healthy lifestyle and getting preventive care can help to promote your health and wellness. The actions you take can also lower your chances of developing some of these common conditions. What are the signs and symptoms of menopause? During menopause, you may have the following symptoms: Hot flashes. These can be moderate or severe. Night sweats. Decrease in sex drive. Mood swings. Headaches. Tiredness (fatigue). Irritability. Memory problems. Problems falling asleep or staying asleep. Talk with your health care provider about treatment options for your symptoms. Do I need hormone replacement therapy? Hormone replacement therapy is effective in treating symptoms that are caused by menopause, such as hot flashes and night sweats. Hormone replacement carries certain risks, especially as you become older. If you are thinking about using estrogen or estrogen with progestin, discuss the benefits and risks with your health care provider. How can I reduce my risk for heart disease and stroke? The risk of heart disease, heart attack, and stroke increases as you age. One of the causes may be a change in the body's hormones during menopause. This can affect how your body uses dietary fats, triglycerides, and cholesterol. Heart attack and stroke are medical emergencies. There are many things that you  can do to help prevent heart disease and stroke. Watch your blood pressure High blood pressure causes heart disease and increases the risk of stroke. This is more likely to develop in people who have high blood pressure readings or are overweight. Have your blood pressure checked: Every 3-5 years if you are 18-40 years of age. Every year if you are 81 years old or older. Eat a healthy diet  Eat a diet that includes plenty of vegetables, fruits, low-fat dairy products, and lean protein. Do not eat a lot of foods that are high in solid fats, added sugars, or sodium. Get regular exercise Get regular exercise. This is one of the most important things you can do for your health. Most adults should: Try to exercise for at least 150 minutes each week. The exercise should increase your heart rate and make you sweat (moderate-intensity exercise). Try to do strengthening exercises at least twice each week. Do these in addition to the moderate-intensity exercise. Spend less time sitting. Even light physical activity can be beneficial. Other tips Work with your health care provider to achieve or maintain a healthy weight. Do not use any products that contain nicotine or tobacco. These products include cigarettes, chewing tobacco, and vaping devices, such as e-cigarettes. If you need help quitting, ask your health care provider. Know your numbers. Ask your health care provider to check your cholesterol and your blood sugar (glucose). Continue to have your blood tested as directed by your health care provider. Do I need screening for cancer? Depending on your health history and family history, you may need to have cancer screenings at different stages of your life. This may include screening for: Breast  cancer. Cervical cancer. Lung cancer. Colorectal cancer. What is my risk for osteoporosis? After menopause, you may be at increased risk for osteoporosis. Osteoporosis is a condition in which bone  destruction happens more quickly than new bone creation. To help prevent osteoporosis or the bone fractures that can happen because of osteoporosis, you may take the following actions: If you are 14-71 years old, get at least 1,000 mg of calcium and at least 600 international units (IU) of vitamin D per day. If you are older than age 105 but younger than age 57, get at least 1,200 mg of calcium and at least 600 international units (IU) of vitamin D per day. If you are older than age 62, get at least 1,200 mg of calcium and at least 800 international units (IU) of vitamin D per day. Smoking and drinking excessive alcohol increase the risk of osteoporosis. Eat foods that are rich in calcium and vitamin D, and do weight-bearing exercises several times each week as directed by your health care provider. How does menopause affect my mental health? Depression may occur at any age, but it is more common as you become older. Common symptoms of depression include: Feeling depressed. Changes in sleep patterns. Changes in appetite or eating patterns. Feeling an overall lack of motivation or enjoyment of activities that you previously enjoyed. Frequent crying spells. Talk with your health care provider if you think that you are experiencing any of these symptoms. General instructions See your health care provider for regular wellness exams and vaccines. This may include: Scheduling regular health, dental, and eye exams. Getting and maintaining your vaccines. These include: Influenza vaccine. Get this vaccine each year before the flu season begins. Pneumonia vaccine. Shingles vaccine. Tetanus, diphtheria, and pertussis (Tdap) booster vaccine. Your health care provider may also recommend other immunizations. Tell your health care provider if you have ever been abused or do not feel safe at home. Summary Menopause is a normal process in which your ability to get pregnant comes to an end. This condition  causes hot flashes, night sweats, decreased interest in sex, mood swings, headaches, or lack of sleep. Treatment for this condition may include hormone replacement therapy. Take actions to keep yourself healthy, including exercising regularly, eating a healthy diet, watching your weight, and checking your blood pressure and blood sugar levels. Get screened for cancer and depression. Make sure that you are up to date with all your vaccines. This information is not intended to replace advice given to you by your health care provider. Make sure you discuss any questions you have with your health care provider. Document Revised: 08/22/2020 Document Reviewed: 08/22/2020 Elsevier Patient Education  2024 [employer].

## 2023-05-30 NOTE — Telephone Encounter (Signed)
close

## 2023-05-30 NOTE — Assessment & Plan Note (Addendum)
Concern for escalation in glycemic control.  Pending A1c. continue Trulicity 4.5 mg, Jardiance 25 mg,  metformin 1000 mg daily.  Consider long-acting insulin.

## 2023-05-30 NOTE — Progress Notes (Signed)
Assessment & Plan:  Routine physical examination Assessment & Plan: Deferred clinical breast exam and Pap smear due to patient preference.  She will return to GYN for Pap smear.  Discussed colonoscopy versus Cologuard.  In the absence of family history or changes to bowel habits, reasonable proceed with Cologuard which I have ordered today.  Requesting results of colonoscopy for clarity.  Reported return in 10 years..    Controlled type 2 diabetes mellitus without complication, without long-term current use of insulin (HCC) Assessment & Plan: Concern for escalation in glycemic control.  Pending A1c. continue Trulicity 4.5 mg, Jardiance 25 mg,  metformin 1000 mg daily.  Consider long-acting insulin.  Orders: -     metFORMIN HCl ER; Take 2 tablets (1,000 mg total) by mouth daily with breakfast. -     Lipid panel -     Hemoglobin A1c -     Comprehensive metabolic panel -     CBC with Differential/Platelet -     Atorvastatin Calcium; TAKE 1 TABLET BY MOUTH DAILY.  Dispense: 90 tablet; Refill: 3 -     Empagliflozin; Take 1 tablet (25 mg total) by mouth daily before breakfast.  Dispense: 90 tablet; Refill: 1  Diarrhea, unspecified type -     metFORMIN HCl ER; Take 2 tablets (1,000 mg total) by mouth daily with breakfast.  Gastroesophageal reflux disease, unspecified whether esophagitis present -     Omeprazole; Take 1 capsule (20 mg total) by mouth daily.  Dispense: 90 capsule; Refill: 3  Screen for colon cancer -     Cologuard     Return precautions given.   Risks, benefits, and alternatives of the medications and treatment plan prescribed today were discussed, and patient expressed understanding.   Education regarding symptom management and diagnosis given to patient on AVS either electronically or printed.  No follow-ups on file.  Rennie Plowman, FNP  Subjective:    Patient ID: Cassandra [name], female    DOB: 07-18-1965, 58 y.o.   MRN: [id number]  CC: Cassandra [name] is a 58 y.o. female who presents today for physical exam.    HPI: Overall feels well today.  No new complaints.  Endorses dietary indiscretion with soda as of late.   She is taking metformin 1000mg  versu 1500mg  every day due GI upset.  Compliant with Trulicity 4.5 mg   Colorectal Cancer Screening: UTD , abstracted 10/27/2023.  Will be due this year.  Unable to see full report in epic. No family h/o colon cancer.  She does not want to have colonoscopy again due to work constraints.  Denies changes to bowel habits, rectal bleeding  Breast Cancer Screening: Mammogram UTD Cervical Cancer Screening: UTD; declines pelvic exam as recently seen 05/23/2023 for gynecologist annual exam.  She will return for Pap smear  Bone Health screening/DEXA for 65+: No increased fracture risk. Defer screening at this time.*  Lung Cancer Screening: Doesn't have 20 year pack year history and age > 40 years yo 58 years        Tetanus - UTD        Exercise: No regular exercise aside from work. Alcohol use:  none Smoking/tobacco use: Nonsmoker.    Health Maintenance  Topic Date Due   Pap with HPV screening  06/03/2022   Eye exam for diabetics  07/16/2022   COVID-19 Vaccine (5 - 2024-25 season) 12/16/2022   Flu Shot  07/15/2023*   Hemoglobin A1C  06/14/2023   Complete foot exam   08/20/2023  Colon Cancer Screening  10/27/2023   Yearly kidney function blood test for diabetes  01/01/2024   Yearly kidney health urinalysis for diabetes  01/01/2024   Mammogram  05/05/2025   DTaP/Tdap/Td vaccine (3 - Td or Tdap) 12/01/2030   Pneumococcal Vaccination  Completed   Hepatitis C Screening  Completed   HIV Screening  Completed   Zoster (Shingles) Vaccine  Completed   HPV Vaccine  Aged Out  *Topic was postponed. The date shown is not the original due date.    ALLERGIES: Egg-derived products and Glipizide  Current Outpatient Medications on File Prior to Visit  Medication Sig Dispense Refill    cyclobenzaprine (FLEXERIL) 5 MG tablet Take 1 to 2 tablets by mouth at bedtime as needed for muscle spasms 30 tablet 0   Dulaglutide (TRULICITY) 4.5 MG/0.5ML SOAJ Inject 4.5 mg under the skin as directed once a week. 2 mL 3   No current facility-administered medications on file prior to visit.    Review of Systems  Constitutional:  Negative for chills, fever and unexpected weight change.  HENT:  Negative for congestion.   Respiratory:  Negative for cough.   Cardiovascular:  Negative for chest pain, palpitations and leg swelling.  Gastrointestinal:  Negative for blood in stool, nausea and vomiting.  Musculoskeletal:  Negative for arthralgias and myalgias.  Skin:  Negative for rash.  Neurological:  Negative for headaches.  Hematological:  Negative for adenopathy.  Psychiatric/Behavioral:  Negative for confusion.       Objective:    BP 138/80   Pulse 75   Temp 98.1 F (36.7 C) (Oral)   Ht 5\' 7"  (1.702 m)   Wt 208 lb (94.3 kg)   SpO2 97%   BMI 32.58 kg/m   BP Readings from Last 3 Encounters:  05/30/23 138/80  05/23/23 116/70  12/13/22 126/78   Wt Readings from Last 3 Encounters:  05/30/23 208 lb (94.3 kg)  05/23/23 211 lb (95.7 kg)  12/13/22 209 lb 9.6 oz (95.1 kg)    Physical Exam Vitals reviewed.  Constitutional:      Appearance: She is well-developed.  Eyes:     Conjunctiva/sclera: Conjunctivae normal.  Neck:     Thyroid: No thyroid mass or thyromegaly.  Cardiovascular:     Rate and Rhythm: Normal rate and regular rhythm.     Pulses: Normal pulses.     Heart sounds: Normal heart sounds.  Pulmonary:     Effort: Pulmonary effort is normal.     Breath sounds: Normal breath sounds. No wheezing, rhonchi or rales.  Lymphadenopathy:     Head:     Right side of head: No submental, submandibular, tonsillar, preauricular, posterior auricular or occipital adenopathy.     Left side of head: No submental, submandibular, tonsillar, preauricular, posterior auricular or  occipital adenopathy.     Cervical: No cervical adenopathy.  Skin:    General: Skin is warm and dry.  Neurological:     Mental Status: She is alert.  Psychiatric:        Speech: Speech normal.        Behavior: Behavior normal.        Thought Content: Thought content normal.

## 2023-05-31 ENCOUNTER — Telehealth: Payer: Self-pay

## 2023-05-31 LAB — LIPID PANEL
Cholesterol: 167 mg/dL (ref 0–200)
HDL: 50.9 mg/dL (ref >39.00)
LDL Cholesterol: 95 mg/dL (ref 0–99)
NonHDL: 115.92
Total CHOL/HDL Ratio: 3
Triglycerides: 105 mg/dL (ref 0.0–149.0)
VLDL: 21 mg/dL (ref 0.0–40.0)

## 2023-05-31 LAB — CBC WITH DIFFERENTIAL/PLATELET
Basophils Absolute: 0 10*3/uL (ref 0.0–0.1)
Basophils Relative: 0.8 % (ref 0.0–3.0)
Eosinophils Absolute: 0.1 10*3/uL (ref 0.0–0.7)
Eosinophils Relative: 1.9 % (ref 0.0–5.0)
HCT: 40.8 % (ref 36.0–46.0)
Hemoglobin: 13.2 g/dL (ref 12.0–15.0)
Lymphocytes Relative: 45.9 % (ref 12.0–46.0)
Lymphs Abs: 2.5 10*3/uL (ref 0.7–4.0)
MCHC: 32.4 g/dL (ref 30.0–36.0)
MCV: 84.5 fL (ref 78.0–100.0)
Monocytes Absolute: 0.6 10*3/uL (ref 0.1–1.0)
Monocytes Relative: 10.9 % (ref 3.0–12.0)
Neutro Abs: 2.2 10*3/uL (ref 1.4–7.7)
Neutrophils Relative %: 40.5 % — ABNORMAL LOW (ref 43.0–77.0)
Platelets: 269 10*3/uL (ref 150.0–400.0)
RBC: 4.83 Mil/uL (ref 3.87–5.11)
RDW: 14 % (ref 11.5–15.5)
WBC: 5.5 10*3/uL (ref 4.0–10.5)

## 2023-05-31 LAB — COMPREHENSIVE METABOLIC PANEL
ALT: 16 U/L (ref 0–35)
AST: 17 U/L (ref 0–37)
Albumin: 4.1 g/dL (ref 3.5–5.2)
Alkaline Phosphatase: 88 U/L (ref 39–117)
BUN: 7 mg/dL (ref 6–23)
CO2: 28 meq/L (ref 19–32)
Calcium: 9 mg/dL (ref 8.4–10.5)
Chloride: 101 meq/L (ref 96–112)
Creatinine, Ser: 0.67 mg/dL (ref 0.40–1.20)
GFR: 97.59 mL/min (ref >60.00)
Glucose, Bld: 104 mg/dL — ABNORMAL HIGH (ref 70–99)
Potassium: 3.6 meq/L (ref 3.5–5.1)
Sodium: 137 meq/L (ref 135–145)
Total Bilirubin: 0.7 mg/dL (ref 0.2–1.2)
Total Protein: 7.3 g/dL (ref 6.0–8.3)

## 2023-05-31 LAB — HEMOGLOBIN A1C: Hgb A1c MFr Bld: 8.8 % — ABNORMAL HIGH (ref 4.6–6.5)

## 2023-05-31 NOTE — Telephone Encounter (Signed)
Pt vm was full was unable to lvm, will try back at later date

## 2023-05-31 NOTE — Telephone Encounter (Signed)
-----   Message from Rennie Plowman sent at 05/30/2023 10:54 PM EST ----- Colonoscopy reported 9 years ago.  I do not have this record in the chart.  Can you reach out to patient to get more detail to see if we can get the actual colonoscopy report to have in her record. I did order Cologuard today at her physical

## 2023-06-04 ENCOUNTER — Ambulatory Visit: Payer: 59 | Admitting: Obstetrics and Gynecology

## 2023-06-04 ENCOUNTER — Encounter: Payer: Self-pay | Admitting: Family

## 2023-06-05 ENCOUNTER — Other Ambulatory Visit: Payer: Self-pay

## 2023-06-06 ENCOUNTER — Telehealth: Payer: Self-pay

## 2023-06-06 NOTE — Telephone Encounter (Signed)
 LVM to call back to inquire about message below   Colonoscopy reported 9 years ago.  I do not have this record in the chart.  Can you reach out to patient to get more detail to see if we can get the actual colonoscopy report to have in her record. I did order Cologuard today at her physical

## 2023-06-07 ENCOUNTER — Telehealth: Payer: Self-pay

## 2023-06-07 NOTE — Telephone Encounter (Signed)
 Copied from CRM [id number]. Topic: General - Other >> Jun 07, 2023 12:31 PM Corin V wrote: Reason for CRM: Patient returned call to the office. She received her previous colonoscopy 9 years ago at the [facility] in [place]

## 2023-06-10 NOTE — Telephone Encounter (Signed)
 Call pt  We would need provider /practice to get records as I do not see in her chart  Does she return in 10 years for repeat?  If so , she is due next year if not the end of this year.   Does she want [place] to go ahead and place referral for colonoscopy or she is doing cologuard instead ( ordered) ?

## 2023-06-11 NOTE — Telephone Encounter (Signed)
 Spoke to pt she does not remember Dr but it was with [place] in [place] may have to contact medical records but she did get Colorguard in mail yesterday

## 2023-06-14 ENCOUNTER — Telehealth: Payer: Self-pay

## 2023-06-14 NOTE — Telephone Encounter (Signed)
 Copied from CRM [id number]. Topic: General - Other >> Jun 14, 2023 10:35 AM Truddie Crumble wrote: Reason for CRM: patient returning a call to daja at the office

## 2023-06-14 NOTE — Telephone Encounter (Signed)
 Called and informed pt of results

## 2023-06-14 NOTE — Progress Notes (Signed)
 Medical Release request was sent via EPIC to provider that performed Colonoscopy.

## 2023-06-18 ENCOUNTER — Other Ambulatory Visit: Payer: Self-pay

## 2023-06-18 ENCOUNTER — Other Ambulatory Visit: Payer: Self-pay | Admitting: Family

## 2023-06-18 DIAGNOSIS — E119 Type 2 diabetes mellitus without complications: Secondary | ICD-10-CM

## 2023-06-18 MED ORDER — LANTUS SOLOSTAR 100 UNIT/ML ~~LOC~~ SOPN
5.0000 [IU] | PEN_INJECTOR | Freq: Every morning | SUBCUTANEOUS | 11 refills | Status: DC
  Filled 2023-06-18: qty 3, 28d supply, fill #0

## 2023-06-18 NOTE — Telephone Encounter (Signed)
 noted

## 2023-06-19 ENCOUNTER — Other Ambulatory Visit: Payer: Self-pay

## 2023-06-19 ENCOUNTER — Telehealth: Payer: Self-pay

## 2023-06-19 MED ORDER — FREESTYLE LIBRE 3 PLUS SENSOR MISC
2 refills | Status: AC
Start: 2023-06-19 — End: ?
  Filled 2023-06-19: qty 4, 60d supply, fill #0

## 2023-06-19 MED ORDER — ATORVASTATIN CALCIUM 40 MG PO TABS
40.0000 mg | ORAL_TABLET | Freq: Every day | ORAL | 3 refills | Status: AC
  Filled 2023-06-19: qty 90, 90d supply, fill #0

## 2023-06-19 NOTE — Addendum Note (Signed)
 Addended by: [place], Zeek Rostron on: 06/19/2023 05:00 PM   Modules accepted: Orders

## 2023-06-19 NOTE — Telephone Encounter (Signed)
 LVM to call  back to go over message below  Need to schedule fasting labs in 6 weeks and need to schedule f/ up in 3 mnths labs are ordered rx sent in . Please relay message to pt and schedule appt accordingly

## 2023-06-19 NOTE — Telephone Encounter (Signed)
-----   Message from Rennie Plowman sent at 06/18/2023  4:20 PM EST ----- Call pt  Please send in lipitor 40 mg Order CMP, lipid panel and sch in 6 weeks, fasting labs You may send in CGM , libre  I sent in lantus 5 units  to start in the morning She must keep blood sugar log and goal is fasting blood sugar less than 120 Please call with blood sugars > 250 or < 70  Ensure she has 3 month f/u appts ----- Message ----- From: Donavan Foil, CMA Sent: 06/14/2023   4:51 PM EST To: Allegra Grana, FNP  Pt has been informed of results and stated she has been taking her Lipitor 20 mg faithfully and is agreeable to increasing to the 40 mg. Pt was informed she can take 2 of her 20 mg for a total of 40 mg and that a new script of 40 mg will be sent to her pharmacy at [facility]. Pt stated she is agreeable to starting on the Lantus insulin. Pt state she would like to use the continuous glucose monitor that goes on the skin

## 2023-06-25 ENCOUNTER — Other Ambulatory Visit: Payer: Self-pay

## 2023-07-01 ENCOUNTER — Other Ambulatory Visit: Payer: Self-pay

## 2023-07-02 ENCOUNTER — Ambulatory Visit (INDEPENDENT_AMBULATORY_CARE_PROVIDER_SITE_OTHER): Payer: 59 | Admitting: Obstetrics and Gynecology

## 2023-07-02 ENCOUNTER — Encounter: Payer: Self-pay | Admitting: Obstetrics and Gynecology

## 2023-07-02 ENCOUNTER — Other Ambulatory Visit (HOSPITAL_COMMUNITY)
Admission: RE | Admit: 2023-07-02 | Discharge: 2023-07-02 | Disposition: A | Discharge status: Home / Self Care | Source: Ambulatory Visit | Attending: Obstetrics and Gynecology | Admitting: Obstetrics and Gynecology

## 2023-07-02 VITALS — BP 100/66 | Ht 67.0 in | Wt 207.0 lb

## 2023-07-02 DIAGNOSIS — Z124 Encounter for screening for malignant neoplasm of cervix: Secondary | ICD-10-CM | POA: Insufficient documentation

## 2023-07-02 DIAGNOSIS — Z1151 Encounter for screening for human papillomavirus (HPV): Secondary | ICD-10-CM | POA: Diagnosis present

## 2023-07-02 NOTE — Patient Instructions (Signed)
 I value your feedback and you entrusting [place] with your care. If you get a [place] patient survey, I would appreciate you taking the time to let us know about your experience today. Thank you! ? ? ?

## 2023-07-02 NOTE — Progress Notes (Addendum)
 Pt RTO for pap smear, not collected at 05/23/23 annual. No charge to pt.  Heard from pelvic PT but plans to hold off on referral for now. Recommended pelvic floor exercises online, stop drinking a few hrs before bed, and no caffeine after 12 noon. Pt to f/u prn.

## 2023-07-04 LAB — CYTOLOGY - PAP
Comment: NEGATIVE
Diagnosis: NEGATIVE
High risk HPV: NEGATIVE

## 2023-07-05 ENCOUNTER — Other Ambulatory Visit: Payer: Self-pay

## 2023-07-11 LAB — COLOGUARD: COLOGUARD: NEGATIVE

## 2023-07-19 ENCOUNTER — Other Ambulatory Visit: Payer: Self-pay

## 2023-07-22 ENCOUNTER — Other Ambulatory Visit: Payer: Self-pay | Admitting: Family

## 2023-07-22 ENCOUNTER — Other Ambulatory Visit: Payer: Self-pay

## 2023-07-22 DIAGNOSIS — E119 Type 2 diabetes mellitus without complications: Secondary | ICD-10-CM

## 2023-07-22 DIAGNOSIS — R197 Diarrhea, unspecified: Secondary | ICD-10-CM

## 2023-07-22 MED FILL — Metformin HCl Tab ER 24HR 500 MG: ORAL | 90 days supply | Qty: 360 | Fill #0 | Status: AC

## 2023-07-23 ENCOUNTER — Other Ambulatory Visit: Payer: Self-pay

## 2023-08-02 ENCOUNTER — Other Ambulatory Visit: Payer: Self-pay

## 2023-08-06 ENCOUNTER — Other Ambulatory Visit

## 2023-08-07 ENCOUNTER — Other Ambulatory Visit: Payer: Self-pay

## 2023-08-29 ENCOUNTER — Other Ambulatory Visit: Payer: Self-pay | Admitting: Family

## 2023-08-29 DIAGNOSIS — E119 Type 2 diabetes mellitus without complications: Secondary | ICD-10-CM

## 2023-08-30 ENCOUNTER — Other Ambulatory Visit: Payer: Self-pay | Admitting: Family

## 2023-08-30 ENCOUNTER — Other Ambulatory Visit: Payer: Self-pay

## 2023-08-30 DIAGNOSIS — E119 Type 2 diabetes mellitus without complications: Secondary | ICD-10-CM

## 2023-08-30 MED FILL — Dulaglutide Soln Auto-injector 4.5 MG/0.5ML: SUBCUTANEOUS | 28 days supply | Qty: 2 | Fill #0 | Status: AC

## 2023-09-30 MED FILL — Dulaglutide Soln Auto-injector 4.5 MG/0.5ML: SUBCUTANEOUS | 28 days supply | Qty: 2 | Fill #1 | Status: AC

## 2023-10-28 MED FILL — Dulaglutide Soln Auto-injector 4.5 MG/0.5ML: SUBCUTANEOUS | 28 days supply | Qty: 2 | Fill #2 | Status: AC

## 2023-11-25 MED FILL — Dulaglutide Soln Auto-injector 4.5 MG/0.5ML: SUBCUTANEOUS | 28 days supply | Qty: 2 | Fill #3 | Status: AC

## 2023-12-23 ENCOUNTER — Other Ambulatory Visit: Payer: Self-pay | Admitting: Family

## 2023-12-23 DIAGNOSIS — E119 Type 2 diabetes mellitus without complications: Secondary | ICD-10-CM

## 2023-12-24 ENCOUNTER — Other Ambulatory Visit: Payer: Self-pay

## 2023-12-24 ENCOUNTER — Other Ambulatory Visit: Payer: Self-pay | Admitting: Family

## 2023-12-24 DIAGNOSIS — E119 Type 2 diabetes mellitus without complications: Secondary | ICD-10-CM

## 2023-12-24 MED FILL — Dulaglutide Soln Auto-injector 4.5 MG/0.5ML: SUBCUTANEOUS | 28 days supply | Qty: 2 | Fill #0 | Status: AC

## 2023-12-25 ENCOUNTER — Other Ambulatory Visit: Payer: Self-pay

## 2024-01-22 MED FILL — Dulaglutide Soln Auto-injector 4.5 MG/0.5ML: SUBCUTANEOUS | 28 days supply | Qty: 2 | Fill #1 | Status: AC

## 2024-01-24 ENCOUNTER — Other Ambulatory Visit: Payer: Self-pay

## 2024-01-24 ENCOUNTER — Other Ambulatory Visit: Payer: Self-pay | Admitting: Family

## 2024-01-24 DIAGNOSIS — E119 Type 2 diabetes mellitus without complications: Secondary | ICD-10-CM

## 2024-01-24 MED FILL — Metformin HCl Tab ER 24HR 500 MG: ORAL | 90 days supply | Qty: 360 | Fill #1 | Status: AC

## 2024-01-26 ENCOUNTER — Other Ambulatory Visit: Payer: Self-pay

## 2024-01-27 ENCOUNTER — Other Ambulatory Visit: Payer: Self-pay

## 2024-01-27 MED FILL — Empagliflozin Tab 25 MG: ORAL | 90 days supply | Qty: 90 | Fill #0 | Status: AC

## 2024-01-30 ENCOUNTER — Other Ambulatory Visit: Payer: Self-pay

## 2024-02-10 ENCOUNTER — Encounter: Payer: Self-pay | Admitting: Internal Medicine

## 2024-02-10 ENCOUNTER — Other Ambulatory Visit: Payer: Self-pay

## 2024-02-10 ENCOUNTER — Ambulatory Visit: Admitting: Internal Medicine

## 2024-02-10 ENCOUNTER — Ambulatory Visit (INDEPENDENT_AMBULATORY_CARE_PROVIDER_SITE_OTHER)

## 2024-02-10 VITALS — BP 120/72 | HR 101 | Temp 97.9°F | Ht 67.0 in | Wt 205.0 lb

## 2024-02-10 DIAGNOSIS — R1031 Right lower quadrant pain: Secondary | ICD-10-CM

## 2024-02-10 DIAGNOSIS — Z7985 Long-term (current) use of injectable non-insulin antidiabetic drugs: Secondary | ICD-10-CM

## 2024-02-10 DIAGNOSIS — I10 Essential (primary) hypertension: Secondary | ICD-10-CM

## 2024-02-10 DIAGNOSIS — E118 Type 2 diabetes mellitus with unspecified complications: Secondary | ICD-10-CM

## 2024-02-10 DIAGNOSIS — Z7984 Long term (current) use of oral hypoglycemic drugs: Secondary | ICD-10-CM | POA: Diagnosis not present

## 2024-02-10 LAB — POCT GLYCOSYLATED HEMOGLOBIN (HGB A1C): Hemoglobin A1C: 8.2 % — AB (ref 4.0–5.6)

## 2024-02-10 LAB — MICROALBUMIN / CREATININE URINE RATIO
Creatinine,U: 39.1 mg/dL
Microalb Creat Ratio: UNDETERMINED mg/g (ref 0.0–30.0)
Microalb, Ur: <0.7 mg/dL

## 2024-02-10 MED ORDER — IBUPROFEN 600 MG PO TABS
600.0000 mg | ORAL_TABLET | Freq: Three times a day (TID) | ORAL | 0 refills | Status: AC
  Filled 2024-02-10: qty 30, 10d supply, fill #0

## 2024-02-10 NOTE — Assessment & Plan Note (Signed)
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  It appears like a nagging type of pain during the day in the right groin -She also complains of associated right lateral thigh pain which is mild in nature -On exam, patient does have some mild tenderness to palpation over her right greater trochanter as well as mild tenderness to palpation in the right groin area -I suspect that she likely has mild trochanteric bursitis as well as likely muscle pain in the right groin -However, we will obtain a right hip x-ray to rule out other etiologies of pain -Continue with ibuprofen 3 times daily for 5 to 7 days to see if this can help relieve the pain -Continue with ice and compression -No further workup at this time

## 2024-02-10 NOTE — Patient Instructions (Signed)
  VISIT SUMMARY: You came in today because of right hip pain that has been bothering you for about two weeks. We also reviewed your diabetes management.  YOUR PLAN: -RIGHT HIP AND GROIN PAIN: You have been experiencing burning and throbbing pain in your right hip and groin, likely due to lifting at work. This could be arthritis or a muscle strain. We will do an X-ray to check for any bone issues. In the meantime, take ibuprofen three times a day for 5-7 days to help with inflammation. Use ice and compression to manage the pain. Please come back if the pain gets worse or does not improve.  -TYPE 2 DIABETES MELLITUS: Your Type 2 diabetes is currently managed with Trulicity  and metformin . We need to check your A1c and urine protein levels to monitor your blood sugar control and kidney function. These tests are important to ensure your diabetes is well-managed.  INSTRUCTIONS: Please get an X-ray of your right hip as soon as possible. Also, get your A1c and urine protein tests done to monitor your diabetes. Follow up if your hip pain worsens or does not improve.                      Contains text generated by Abridge.                                 Contains text generated by Abridge.

## 2024-02-10 NOTE — Progress Notes (Signed)
 Acute Office Visit  Subjective:     Patient ID: Cassandra [name], female    DOB: Jan 31, 1966, 58 y.o.   MRN: [id number]  Chief Complaint  Patient presents with   Acute Visit    Right hip pain 3-4/10 pain during the day Pain 6/10 at night   Discussed the use of AI scribe software for clinical note transcription with the patient, who gave verbal consent to proceed.  History of Present Illness Cassandra [name] is a 58 year old female with diabetes who presents with right hip pain.  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   HENT: Negative.    Respiratory: Negative.    Cardiovascular: Negative.   Gastrointestinal: Negative.   Musculoskeletal:  Positive for joint pain.       Patient complains of right hip/groin pain for the last 2 weeks  Neurological: Negative.   Psychiatric/Behavioral: Negative.          Objective:    BP 120/72   Pulse (!) 101   Temp 97.9 F (36.6 C)   Ht 5' 7 (1.702 m)   Wt 205 lb (93 kg)   SpO2 97%   BMI 32.11 kg/m    Physical Exam Constitutional:      Appearance: Normal appearance.  HENT:     Head: Normocephalic and  atraumatic.  Cardiovascular:     Rate and Rhythm: Normal rate and regular rhythm.     Heart sounds: Normal heart sounds.  Pulmonary:     Effort: Pulmonary effort is normal.     Breath sounds: Normal breath sounds. No wheezing, rhonchi or rales.  Abdominal:     General: Bowel sounds are normal. There is no distension.     Palpations: Abdomen is soft.     Tenderness: There is no abdominal tenderness. There is no guarding or rebound.  Musculoskeletal:        General: Tenderness present. No swelling.     Right lower leg: No edema.     Left lower leg: No edema.     Comments: Patient monitored and is palpation over the right greater trochanter as well as in the right groin  Neurological:     Mental Status: She is alert.  Psychiatric:        Mood and Affect: Mood normal.        Behavior: Behavior normal.     Results for orders placed or performed in visit on 02/10/24  POCT glycosylated hemoglobin (Hb A1C)  Result Value Ref Range   Hemoglobin A1C 8.2 (A) 4.0 - 5.6 %   HbA1c POC (<> result, manual entry)     HbA1c, POC (prediabetic range)     HbA1c, POC (  controlled diabetic range)          Assessment & Plan:   Problem List Items Addressed This Visit       Cardiovascular and Mediastinum   Benign essential HTN   - This problem is chronic and stable -Blood pressure well-controlled off medications (120/72 today) -No further workup at this time -Will continue to monitor        Endocrine   Diabetes mellitus type 2 with complications (HCC) - Primary   - This problem is chronic -Patient was last A1c was elevated in the eights -Patient is compliant with Trulicity  4.5 mg weekly as well as metformin  1000 mg twice daily and Jardiance  25 mg daily -Will check a repeat point-of-care A1c today -Will also check urine microalbumin to creatinine ratio -No further workup at this time  Addendum: -Patient's A1c is 8.2.  Will continue with current medication regimen for now -Patient to  follow-up with her PCP for further evaluation      Relevant Orders   Microalbumin / creatinine urine ratio   POCT glycosylated hemoglobin (Hb A1C) (Completed)     Other   Right groin pain   - Patient complains of pain in the right groin for the last 2 weeks occurring after getting up from a seated position at a table -She does state that she does stocking and lifting once weekly -Patient states the pain is burning in nature and worse at night.  It appears like a nagging type of pain during the day in the right groin -She also complains of associated right lateral thigh pain which is mild in nature -On exam, patient does have some mild tenderness to palpation over her right greater trochanter as well as mild tenderness to palpation in the right groin area -I suspect that she likely has mild trochanteric bursitis as well as likely muscle pain in the right groin -However, we will obtain a right hip x-ray to rule out other etiologies of pain -Continue with ibuprofen 3 times daily for 5 to 7 days to see if this can help relieve the pain -Continue with ice and compression -No further workup at this time      Relevant Medications   ibuprofen (ADVIL) 600 MG tablet   Other Relevant Orders   DG Hip Unilat W OR W/O Pelvis Min 4 Views Right    Meds ordered this encounter  Medications   ibuprofen (ADVIL) 600 MG tablet    Sig: Take 1 tablet (600 mg total) by mouth 3 (three) times daily.    Dispense:  30 tablet    Refill:  0    No follow-ups on file.  Zakariyah Freimark, MD

## 2024-02-10 NOTE — Assessment & Plan Note (Signed)
-   This problem is chronic and stable -Blood pressure well-controlled off medications (120/72 today) -No further workup at this time -Will continue to monitor

## 2024-02-10 NOTE — Assessment & Plan Note (Addendum)
-   This problem is chronic -Patient was last A1c was elevated in the eights -Patient is compliant with Trulicity  4.5 mg weekly as well as metformin  1000 mg twice daily and Jardiance  25 mg daily -Will check a repeat point-of-care A1c today -Will also check urine microalbumin to creatinine ratio -No further workup at this time  Addendum: -Patient's A1c is 8.2.  Will continue with current medication regimen for now -Patient to follow-up with her PCP for further evaluation

## 2024-02-11 ENCOUNTER — Ambulatory Visit: Payer: Self-pay | Admitting: Internal Medicine

## 2024-02-11 DIAGNOSIS — R1031 Right lower quadrant pain: Secondary | ICD-10-CM

## 2024-02-11 NOTE — Telephone Encounter (Signed)
 Copied from CRM [id number]. Topic: Clinical - Lab/Test Results >> Feb 11, 2024  4:42 PM Delon T wrote: Reason for CRM: read results verbatim, pain is still about the same at level 4, worse at night, about 6-7 at night, it throbs- [phone]

## 2024-02-19 MED FILL — Dulaglutide Soln Auto-injector 4.5 MG/0.5ML: SUBCUTANEOUS | 28 days supply | Qty: 2 | Fill #2 | Status: AC

## 2024-02-25 ENCOUNTER — Encounter: Admitting: Pediatrics

## 2024-02-25 ENCOUNTER — Telehealth: Payer: Self-pay

## 2024-02-25 NOTE — Telephone Encounter (Signed)
 I left voicemail for patient asking her to please call us  back.  E2C2 - when patient returns our call, please let her know that we have an appointment scheduled for her tomorrow with Rollene Northern, FNP-C.  This appointment is listed as a physical, but she has already had a physical this year on 05/30/2023, so this will be a regular office visit.  Also, please reschedule patient's appointment for her physical in 2026 (it needs to be after 05/29/2024).

## 2024-02-26 ENCOUNTER — Ambulatory Visit: Admitting: Family

## 2024-03-23 MED FILL — Dulaglutide Soln Auto-injector 4.5 MG/0.5ML: SUBCUTANEOUS | 28 days supply | Qty: 2 | Fill #3 | Status: AC

## 2024-04-20 ENCOUNTER — Other Ambulatory Visit: Payer: Self-pay | Admitting: Family

## 2024-04-20 DIAGNOSIS — E119 Type 2 diabetes mellitus without complications: Secondary | ICD-10-CM

## 2024-04-22 ENCOUNTER — Other Ambulatory Visit: Payer: Self-pay

## 2024-04-22 MED ORDER — TRULICITY 4.5 MG/0.5ML ~~LOC~~ SOAJ
4.5000 mg | SUBCUTANEOUS | 3 refills | Status: AC
  Filled 2024-04-22: qty 2, 28d supply, fill #0
  Filled 2024-05-14 – 2024-05-18 (×2): qty 2, 28d supply, fill #1

## 2024-05-07 MED FILL — Empagliflozin Tab 25 MG: ORAL | 90 days supply | Qty: 90 | Fill #1 | Status: AC

## 2024-05-15 ENCOUNTER — Other Ambulatory Visit: Payer: Self-pay

## 2024-05-19 ENCOUNTER — Other Ambulatory Visit: Payer: Self-pay

## 2024-06-01 ENCOUNTER — Encounter: Admitting: Family
